# Patient Record
Sex: Female | Born: 1945
Health system: Southern US, Community
[De-identification: ages and names within clinical notes are randomized; demographics above are authoritative.]

## PROBLEM LIST (undated history)

## (undated) DIAGNOSIS — D473 Essential (hemorrhagic) thrombocythemia: Secondary | ICD-10-CM

## (undated) DIAGNOSIS — D75839 Thrombocytosis, unspecified: Secondary | ICD-10-CM

## (undated) DIAGNOSIS — H3530 Unspecified macular degeneration: Secondary | ICD-10-CM

## (undated) DIAGNOSIS — D751 Secondary polycythemia: Secondary | ICD-10-CM

## (undated) DIAGNOSIS — C801 Malignant (primary) neoplasm, unspecified: Secondary | ICD-10-CM

## (undated) DIAGNOSIS — E785 Hyperlipidemia, unspecified: Secondary | ICD-10-CM

## (undated) DIAGNOSIS — G709 Myoneural disorder, unspecified: Secondary | ICD-10-CM

## (undated) HISTORY — DX: Hyperlipidemia, unspecified: E78.5

## (undated) HISTORY — PX: TUMOR EXCISION: SHX421

## (undated) HISTORY — DX: Essential (hemorrhagic) thrombocythemia: D47.3

## (undated) HISTORY — DX: Myoneural disorder, unspecified: G70.9

## (undated) HISTORY — DX: Thrombocytosis, unspecified: D75.839

## (undated) HISTORY — DX: Secondary polycythemia: D75.1

## (undated) HISTORY — DX: Unspecified macular degeneration: H35.30

## (undated) HISTORY — PX: OTHER SURGICAL HISTORY: SHX169

## (undated) HISTORY — PX: CHOLECYSTECTOMY: SHX55

## (undated) HISTORY — DX: Malignant (primary) neoplasm, unspecified: C80.1

## (undated) HISTORY — PX: TONSILLECTOMY: SUR1361

## (undated) HISTORY — PX: POLYPECTOMY: SHX149

## (undated) HISTORY — PX: COLONOSCOPY: SHX174

## (undated) HISTORY — PX: ABDOMINAL HYSTERECTOMY: SHX81

---

## 2000-05-02 ENCOUNTER — Encounter: Payer: Self-pay | Admitting: Family Medicine

## 2000-05-02 ENCOUNTER — Encounter: Admission: RE | Admit: 2000-05-02 | Discharge: 2000-05-02 | Payer: Self-pay | Admitting: Family Medicine

## 2001-05-05 ENCOUNTER — Encounter: Payer: Self-pay | Admitting: Family Medicine

## 2001-05-05 ENCOUNTER — Encounter: Admission: RE | Admit: 2001-05-05 | Discharge: 2001-05-05 | Payer: Self-pay | Admitting: Family Medicine

## 2002-05-08 ENCOUNTER — Encounter: Admission: RE | Admit: 2002-05-08 | Discharge: 2002-05-08 | Payer: Self-pay | Admitting: Family Medicine

## 2002-05-08 ENCOUNTER — Encounter: Payer: Self-pay | Admitting: Family Medicine

## 2003-05-22 ENCOUNTER — Encounter: Admission: RE | Admit: 2003-05-22 | Discharge: 2003-05-22 | Payer: Self-pay | Admitting: Family Medicine

## 2003-05-22 ENCOUNTER — Encounter: Payer: Self-pay | Admitting: Family Medicine

## 2004-05-22 ENCOUNTER — Encounter: Admission: RE | Admit: 2004-05-22 | Discharge: 2004-05-22 | Payer: Self-pay | Admitting: Family Medicine

## 2005-01-13 ENCOUNTER — Ambulatory Visit: Payer: Self-pay | Admitting: Gastroenterology

## 2005-01-27 ENCOUNTER — Ambulatory Visit: Payer: Self-pay | Admitting: Gastroenterology

## 2005-06-16 ENCOUNTER — Encounter: Admission: RE | Admit: 2005-06-16 | Discharge: 2005-06-16 | Payer: Self-pay | Admitting: Family Medicine

## 2006-06-22 ENCOUNTER — Encounter: Admission: RE | Admit: 2006-06-22 | Discharge: 2006-06-22 | Payer: Self-pay | Admitting: Family Medicine

## 2006-06-29 ENCOUNTER — Encounter: Admission: RE | Admit: 2006-06-29 | Discharge: 2006-06-29 | Payer: Self-pay | Admitting: Family Medicine

## 2006-08-28 ENCOUNTER — Ambulatory Visit: Payer: Self-pay | Admitting: Oncology

## 2006-09-08 LAB — CBC WITH DIFFERENTIAL/PLATELET
Basophils Absolute: 0 10*3/uL (ref 0.0–0.1)
Eosinophils Absolute: 0.2 10*3/uL (ref 0.0–0.5)
HCT: 44.5 % (ref 34.8–46.6)
HGB: 15.1 g/dL (ref 11.6–15.9)
LYMPH%: 21.8 % (ref 14.0–48.0)
MCV: 93.2 fL (ref 81.0–101.0)
MONO#: 0.9 10*3/uL (ref 0.1–0.9)
MONO%: 8.4 % (ref 0.0–13.0)
NEUT#: 7.1 10*3/uL — ABNORMAL HIGH (ref 1.5–6.5)
NEUT%: 67.2 % (ref 39.6–76.8)
Platelets: 801 10*3/uL — ABNORMAL HIGH (ref 145–400)
RBC: 4.78 10*6/uL (ref 3.70–5.32)
WBC: 10.6 10*3/uL — ABNORMAL HIGH (ref 3.9–10.0)

## 2006-09-13 LAB — COMPREHENSIVE METABOLIC PANEL
ALT: 12 U/L (ref 0–35)
BUN: 12 mg/dL (ref 6–23)
CO2: 24 mEq/L (ref 19–32)
Calcium: 9.2 mg/dL (ref 8.4–10.5)
Chloride: 107 mEq/L (ref 96–112)
Creatinine, Ser: 0.77 mg/dL (ref 0.40–1.20)
Glucose, Bld: 88 mg/dL (ref 70–99)
Total Bilirubin: 0.4 mg/dL (ref 0.3–1.2)

## 2006-09-13 LAB — JAK2 GENOTYPR

## 2006-10-14 ENCOUNTER — Ambulatory Visit: Payer: Self-pay | Admitting: Oncology

## 2006-10-19 LAB — CBC WITH DIFFERENTIAL/PLATELET
BASO%: 1 % (ref 0.0–2.0)
Basophils Absolute: 0.1 10*3/uL (ref 0.0–0.1)
EOS%: 2.5 % (ref 0.0–7.0)
HCT: 46 % (ref 34.8–46.6)
HGB: 15.8 g/dL (ref 11.6–15.9)
MCH: 32.3 pg (ref 26.0–34.0)
MONO#: 0.7 10*3/uL (ref 0.1–0.9)
NEUT%: 74.7 % (ref 39.6–76.8)
RDW: 14.6 % — ABNORMAL HIGH (ref 11.3–14.5)
WBC: 12.7 10*3/uL — ABNORMAL HIGH (ref 3.9–10.0)
lymph#: 2.1 10*3/uL (ref 0.9–3.3)

## 2006-11-24 LAB — CBC WITH DIFFERENTIAL/PLATELET
BASO%: 0.4 % (ref 0.0–2.0)
Eosinophils Absolute: 0.5 10*3/uL (ref 0.0–0.5)
MCV: 91.1 fL (ref 81.0–101.0)
MONO%: 6.4 % (ref 0.0–13.0)
NEUT#: 7.8 10*3/uL — ABNORMAL HIGH (ref 1.5–6.5)
RBC: 4.71 10*6/uL (ref 3.70–5.32)
RDW: 13.7 % (ref 11.3–14.5)
WBC: 11.6 10*3/uL — ABNORMAL HIGH (ref 3.9–10.0)

## 2006-11-30 LAB — COMPREHENSIVE METABOLIC PANEL
Albumin: 4.2 g/dL (ref 3.5–5.2)
CO2: 20 mEq/L (ref 19–32)
Calcium: 9 mg/dL (ref 8.4–10.5)
Chloride: 105 mEq/L (ref 96–112)
Glucose, Bld: 95 mg/dL (ref 70–99)
Potassium: 4.4 mEq/L (ref 3.5–5.3)
Sodium: 137 mEq/L (ref 135–145)
Total Bilirubin: 0.4 mg/dL (ref 0.3–1.2)
Total Protein: 7.2 g/dL (ref 6.0–8.3)

## 2006-11-30 LAB — BCR/ABL GENE REARRANGEMENT QNT, PCR

## 2006-11-30 LAB — BCR/ABL QUALITATIVE: BCR/ABL t(9,22): NEGATIVE

## 2006-12-06 ENCOUNTER — Ambulatory Visit: Payer: Self-pay | Admitting: Oncology

## 2006-12-08 LAB — CBC WITH DIFFERENTIAL/PLATELET
BASO%: 1.5 % (ref 0.0–2.0)
Eosinophils Absolute: 0.4 10*3/uL (ref 0.0–0.5)
MCHC: 34.6 g/dL (ref 32.0–36.0)
MONO#: 0.7 10*3/uL (ref 0.1–0.9)
NEUT#: 6.8 10*3/uL — ABNORMAL HIGH (ref 1.5–6.5)
RBC: 4.55 10*6/uL (ref 3.70–5.32)
RDW: 13.7 % (ref 11.3–14.5)
WBC: 10.2 10*3/uL — ABNORMAL HIGH (ref 3.9–10.0)
lymph#: 2.2 10*3/uL (ref 0.9–3.3)

## 2006-12-29 LAB — CBC WITH DIFFERENTIAL/PLATELET
BASO%: 1.4 % (ref 0.0–2.0)
EOS%: 2.3 % (ref 0.0–7.0)
HCT: 44.5 % (ref 34.8–46.6)
LYMPH%: 26.2 % (ref 14.0–48.0)
MCH: 32 pg (ref 26.0–34.0)
MCHC: 35.2 g/dL (ref 32.0–36.0)
NEUT%: 65.1 % (ref 39.6–76.8)
Platelets: 436 10*3/uL — ABNORMAL HIGH (ref 145–400)
RBC: 4.9 10*6/uL (ref 3.70–5.32)
WBC: 11.4 10*3/uL — ABNORMAL HIGH (ref 3.9–10.0)

## 2007-01-12 LAB — CBC WITH DIFFERENTIAL/PLATELET
BASO%: 1.3 % (ref 0.0–2.0)
EOS%: 1.3 % (ref 0.0–7.0)
MCH: 31.4 pg (ref 26.0–34.0)
MCHC: 34.7 g/dL (ref 32.0–36.0)
MONO#: 0.7 10*3/uL (ref 0.1–0.9)
RBC: 5.05 10*6/uL (ref 3.70–5.32)
RDW: 14.7 % — ABNORMAL HIGH (ref 11.3–14.5)
WBC: 9.8 10*3/uL (ref 3.9–10.0)
lymph#: 2.3 10*3/uL (ref 0.9–3.3)

## 2007-01-23 ENCOUNTER — Ambulatory Visit: Payer: Self-pay | Admitting: Oncology

## 2007-01-26 LAB — CBC WITH DIFFERENTIAL/PLATELET
Basophils Absolute: 0.1 10*3/uL (ref 0.0–0.1)
EOS%: 2.1 % (ref 0.0–7.0)
Eosinophils Absolute: 0.2 10*3/uL (ref 0.0–0.5)
HGB: 15.6 g/dL (ref 11.6–15.9)
MCH: 31.9 pg (ref 26.0–34.0)
MONO#: 0.6 10*3/uL (ref 0.1–0.9)
NEUT#: 8.2 10*3/uL — ABNORMAL HIGH (ref 1.5–6.5)
RDW: 14.4 % (ref 11.3–14.5)
WBC: 12.1 10*3/uL — ABNORMAL HIGH (ref 3.9–10.0)
lymph#: 3 10*3/uL (ref 0.9–3.3)

## 2007-02-09 LAB — CBC WITH DIFFERENTIAL/PLATELET
Basophils Absolute: 0.1 10*3/uL (ref 0.0–0.1)
Eosinophils Absolute: 0.2 10*3/uL (ref 0.0–0.5)
HCT: 44.9 % (ref 34.8–46.6)
HGB: 15.7 g/dL (ref 11.6–15.9)
MCV: 89.9 fL (ref 81.0–101.0)
MONO%: 5.6 % (ref 0.0–13.0)
NEUT#: 7.9 10*3/uL — ABNORMAL HIGH (ref 1.5–6.5)
RDW: 14.9 % — ABNORMAL HIGH (ref 11.3–14.5)

## 2007-03-10 ENCOUNTER — Ambulatory Visit: Payer: Self-pay | Admitting: Oncology

## 2007-03-10 LAB — CBC WITH DIFFERENTIAL/PLATELET
Basophils Absolute: 0.3 10*3/uL — ABNORMAL HIGH (ref 0.0–0.1)
Eosinophils Absolute: 0.2 10*3/uL (ref 0.0–0.5)
HGB: 15.2 g/dL (ref 11.6–15.9)
LYMPH%: 21.8 % (ref 14.0–48.0)
MCV: 90.1 fL (ref 81.0–101.0)
MONO%: 3.5 % (ref 0.0–13.0)
NEUT#: 7.8 10*3/uL — ABNORMAL HIGH (ref 1.5–6.5)
Platelets: 534 10*3/uL — ABNORMAL HIGH (ref 145–400)
RBC: 4.82 10*6/uL (ref 3.70–5.32)

## 2007-04-13 LAB — COMPREHENSIVE METABOLIC PANEL
ALT: 15 U/L (ref 0–35)
AST: 21 U/L (ref 0–37)
Albumin: 4.4 g/dL (ref 3.5–5.2)
Alkaline Phosphatase: 60 U/L (ref 39–117)
BUN: 12 mg/dL (ref 6–23)
Potassium: 4.6 mEq/L (ref 3.5–5.3)
Sodium: 141 mEq/L (ref 135–145)
Total Protein: 6.8 g/dL (ref 6.0–8.3)

## 2007-04-13 LAB — CBC WITH DIFFERENTIAL/PLATELET
BASO%: 0.3 % (ref 0.0–2.0)
LYMPH%: 16 % (ref 14.0–48.0)
MCHC: 34.9 g/dL (ref 32.0–36.0)
MCV: 90.9 fL (ref 81.0–101.0)
MONO%: 5.8 % (ref 0.0–13.0)
Platelets: 550 10*3/uL — ABNORMAL HIGH (ref 145–400)
RBC: 4.99 10*6/uL (ref 3.70–5.32)

## 2007-05-01 ENCOUNTER — Ambulatory Visit (HOSPITAL_COMMUNITY): Admission: RE | Admit: 2007-05-01 | Discharge: 2007-05-01 | Payer: Self-pay | Admitting: Oncology

## 2007-05-01 ENCOUNTER — Encounter: Payer: Self-pay | Admitting: Oncology

## 2007-05-01 ENCOUNTER — Ambulatory Visit: Payer: Self-pay | Admitting: Oncology

## 2007-05-02 ENCOUNTER — Ambulatory Visit: Payer: Self-pay | Admitting: Oncology

## 2007-05-05 LAB — CBC WITH DIFFERENTIAL/PLATELET
Basophils Absolute: 0 10*3/uL (ref 0.0–0.1)
EOS%: 1.7 % (ref 0.0–7.0)
HGB: 15.5 g/dL (ref 11.6–15.9)
MCH: 32.3 pg (ref 26.0–34.0)
MCHC: 35.3 g/dL (ref 32.0–36.0)
MCV: 91.5 fL (ref 81.0–101.0)
MONO%: 5.8 % (ref 0.0–13.0)
RDW: 14.6 % — ABNORMAL HIGH (ref 11.3–14.5)

## 2007-05-05 LAB — COMPREHENSIVE METABOLIC PANEL
AST: 18 U/L (ref 0–37)
Alkaline Phosphatase: 65 U/L (ref 39–117)
BUN: 14 mg/dL (ref 6–23)
Creatinine, Ser: 0.97 mg/dL (ref 0.40–1.20)
Potassium: 5 mEq/L (ref 3.5–5.3)

## 2007-06-26 ENCOUNTER — Ambulatory Visit: Payer: Self-pay | Admitting: Oncology

## 2007-06-28 ENCOUNTER — Encounter: Admission: RE | Admit: 2007-06-28 | Discharge: 2007-06-28 | Payer: Self-pay | Admitting: *Deleted

## 2007-06-28 LAB — CBC WITH DIFFERENTIAL/PLATELET
BASO%: 0.6 % (ref 0.0–2.0)
Basophils Absolute: 0.1 10*3/uL (ref 0.0–0.1)
HCT: 41.9 % (ref 34.8–46.6)
HGB: 14.9 g/dL (ref 11.6–15.9)
LYMPH%: 21.4 % (ref 14.0–48.0)
MCH: 32.8 pg (ref 26.0–34.0)
MCV: 92.1 fL (ref 81.0–101.0)

## 2007-08-02 LAB — CBC WITH DIFFERENTIAL/PLATELET
BASO%: 0.4 % (ref 0.0–2.0)
Eosinophils Absolute: 0.2 10*3/uL (ref 0.0–0.5)
HCT: 44.3 % (ref 34.8–46.6)
HGB: 15.5 g/dL (ref 11.6–15.9)
LYMPH%: 24.8 % (ref 14.0–48.0)
MCHC: 35 g/dL (ref 32.0–36.0)
MONO#: 0.6 10*3/uL (ref 0.1–0.9)
NEUT#: 6.5 10*3/uL (ref 1.5–6.5)
NEUT%: 66.2 % (ref 39.6–76.8)
Platelets: 753 10*3/uL — ABNORMAL HIGH (ref 145–400)
WBC: 9.8 10*3/uL (ref 3.9–10.0)
lymph#: 2.4 10*3/uL (ref 0.9–3.3)

## 2007-10-02 ENCOUNTER — Ambulatory Visit: Payer: Self-pay | Admitting: Oncology

## 2007-10-04 LAB — COMPREHENSIVE METABOLIC PANEL
ALT: 19 U/L (ref 0–35)
AST: 24 U/L (ref 0–37)
Albumin: 4.6 g/dL (ref 3.5–5.2)
Alkaline Phosphatase: 81 U/L (ref 39–117)
Calcium: 9.8 mg/dL (ref 8.4–10.5)
Chloride: 102 mEq/L (ref 96–112)
Potassium: 4.9 mEq/L (ref 3.5–5.3)
Sodium: 141 mEq/L (ref 135–145)
Total Protein: 7.2 g/dL (ref 6.0–8.3)

## 2007-10-04 LAB — CBC WITH DIFFERENTIAL/PLATELET
BASO%: 0.1 % (ref 0.0–2.0)
EOS%: 3.3 % (ref 0.0–7.0)
HGB: 16.6 g/dL — ABNORMAL HIGH (ref 11.6–15.9)
MCH: 32 pg (ref 26.0–34.0)
MCV: 91.7 fL (ref 81.0–101.0)
MONO%: 4.7 % (ref 0.0–13.0)
RBC: 5.19 10*6/uL (ref 3.70–5.32)
RDW: 14 % (ref 11.3–14.5)
lymph#: 2.3 10*3/uL (ref 0.9–3.3)

## 2007-11-07 LAB — CBC WITH DIFFERENTIAL/PLATELET
Basophils Absolute: 0.1 10*3/uL (ref 0.0–0.1)
Eosinophils Absolute: 0.5 10*3/uL (ref 0.0–0.5)
HGB: 15.6 g/dL (ref 11.6–15.9)
MCV: 90.6 fL (ref 81.0–101.0)
MONO#: 0.5 10*3/uL (ref 0.1–0.9)
MONO%: 4.4 % (ref 0.0–13.0)
NEUT#: 8.9 10*3/uL — ABNORMAL HIGH (ref 1.5–6.5)
RDW: 14.1 % (ref 11.3–14.5)
WBC: 12.4 10*3/uL — ABNORMAL HIGH (ref 3.9–10.0)

## 2007-12-01 ENCOUNTER — Ambulatory Visit: Payer: Self-pay | Admitting: Oncology

## 2007-12-05 LAB — CBC WITH DIFFERENTIAL/PLATELET
BASO%: 0.7 % (ref 0.0–2.0)
HCT: 45.7 % (ref 34.8–46.6)
HGB: 16 g/dL — ABNORMAL HIGH (ref 11.6–15.9)
MCHC: 34.9 g/dL (ref 32.0–36.0)
MONO#: 0.9 10*3/uL (ref 0.1–0.9)
NEUT%: 69.9 % (ref 39.6–76.8)
WBC: 12 10*3/uL — ABNORMAL HIGH (ref 3.9–10.0)
lymph#: 2.2 10*3/uL (ref 0.9–3.3)

## 2008-01-09 LAB — CBC WITH DIFFERENTIAL/PLATELET
Basophils Absolute: 0.1 10*3/uL (ref 0.0–0.1)
Eosinophils Absolute: 0.3 10*3/uL (ref 0.0–0.5)
HGB: 16.1 g/dL — ABNORMAL HIGH (ref 11.6–15.9)
LYMPH%: 27.2 % (ref 14.0–48.0)
MCV: 90.9 fL (ref 81.0–101.0)
MONO%: 1.6 % (ref 0.0–13.0)
NEUT#: 9.5 10*3/uL — ABNORMAL HIGH (ref 1.5–6.5)
NEUT%: 68.6 % (ref 39.6–76.8)
Platelets: INCREASED 10*3/uL (ref 145–400)

## 2008-01-09 LAB — LACTATE DEHYDROGENASE: LDH: 164 U/L (ref 94–250)

## 2008-01-09 LAB — COMPREHENSIVE METABOLIC PANEL
CO2: 24 mEq/L (ref 19–32)
Creatinine, Ser: 1.04 mg/dL (ref 0.40–1.20)
Glucose, Bld: 86 mg/dL (ref 70–99)
Total Bilirubin: 0.4 mg/dL (ref 0.3–1.2)
Total Protein: 6.8 g/dL (ref 6.0–8.3)

## 2008-02-06 ENCOUNTER — Ambulatory Visit: Payer: Self-pay | Admitting: Oncology

## 2008-02-22 LAB — CBC WITH DIFFERENTIAL/PLATELET
BASO%: 0.6 % (ref 0.0–2.0)
Eosinophils Absolute: 0.4 10*3/uL (ref 0.0–0.5)
LYMPH%: 18.8 % (ref 14.0–48.0)
MCHC: 34.2 g/dL (ref 32.0–36.0)
MCV: 91.8 fL (ref 81.0–101.0)
MONO%: 6.9 % (ref 0.0–13.0)
Platelets: 1041 10*3/uL — ABNORMAL HIGH (ref 145–400)
RBC: 4.81 10*6/uL (ref 3.70–5.32)

## 2008-03-07 LAB — COMPREHENSIVE METABOLIC PANEL
ALT: 17 U/L (ref 0–35)
Alkaline Phosphatase: 63 U/L (ref 39–117)
Sodium: 140 mEq/L (ref 135–145)
Total Bilirubin: 0.6 mg/dL (ref 0.3–1.2)
Total Protein: 6.9 g/dL (ref 6.0–8.3)

## 2008-03-07 LAB — CBC WITH DIFFERENTIAL/PLATELET
BASO%: 1.7 % (ref 0.0–2.0)
LYMPH%: 21.9 % (ref 14.0–48.0)
MCHC: 34 g/dL (ref 32.0–36.0)
MCV: 94.6 fL (ref 81.0–101.0)
MONO#: 0.7 10*3/uL (ref 0.1–0.9)
MONO%: 6.6 % (ref 0.0–13.0)
Platelets: 814 10*3/uL — ABNORMAL HIGH (ref 145–400)
RBC: 5 10*6/uL (ref 3.70–5.32)
WBC: 10.1 10*3/uL — ABNORMAL HIGH (ref 3.9–10.0)

## 2008-04-01 ENCOUNTER — Ambulatory Visit: Payer: Self-pay | Admitting: Oncology

## 2008-04-03 LAB — CBC WITH DIFFERENTIAL/PLATELET
EOS%: 2.3 % (ref 0.0–7.0)
Eosinophils Absolute: 0.2 10*3/uL (ref 0.0–0.5)
LYMPH%: 24.8 % (ref 14.0–48.0)
MCH: 33.2 pg (ref 26.0–34.0)
MCV: 96 fL (ref 81.0–101.0)
MONO%: 8.4 % (ref 0.0–13.0)
NEUT#: 6.7 10*3/uL — ABNORMAL HIGH (ref 1.5–6.5)
Platelets: 830 10*3/uL — ABNORMAL HIGH (ref 145–400)
RBC: 4.77 10*6/uL (ref 3.70–5.32)

## 2008-05-02 LAB — CBC WITH DIFFERENTIAL/PLATELET
Basophils Absolute: 0 10*3/uL (ref 0.0–0.1)
Eosinophils Absolute: 0.2 10*3/uL (ref 0.0–0.5)
HCT: 43.9 % (ref 34.8–46.6)
HGB: 15.2 g/dL (ref 11.6–15.9)
LYMPH%: 26.3 % (ref 14.0–48.0)
MCH: 34.6 pg — ABNORMAL HIGH (ref 26.0–34.0)
MCV: 99.7 fL (ref 81.0–101.0)
MONO%: 7.9 % (ref 0.0–13.0)
NEUT#: 5 10*3/uL (ref 1.5–6.5)
NEUT%: 62.4 % (ref 39.6–76.8)
Platelets: 579 10*3/uL — ABNORMAL HIGH (ref 145–400)
RDW: 17.3 % — ABNORMAL HIGH (ref 11.3–14.5)

## 2008-05-31 ENCOUNTER — Ambulatory Visit: Payer: Self-pay | Admitting: Oncology

## 2008-06-05 LAB — CBC WITH DIFFERENTIAL/PLATELET
Basophils Absolute: 0 10*3/uL (ref 0.0–0.1)
Eosinophils Absolute: 0.2 10*3/uL (ref 0.0–0.5)
HCT: 46.4 % (ref 34.8–46.6)
HGB: 16.1 g/dL — ABNORMAL HIGH (ref 11.6–15.9)
LYMPH%: 25.1 % (ref 14.0–48.0)
MONO#: 0.5 10*3/uL (ref 0.1–0.9)
NEUT#: 5.7 10*3/uL (ref 1.5–6.5)
NEUT%: 66.8 % (ref 39.6–76.8)
Platelets: 579 10*3/uL — ABNORMAL HIGH (ref 145–400)
RBC: 4.51 10*6/uL (ref 3.70–5.32)
WBC: 8.5 10*3/uL (ref 3.9–10.0)

## 2008-06-05 LAB — COMPREHENSIVE METABOLIC PANEL
AST: 25 U/L (ref 0–37)
Alkaline Phosphatase: 62 U/L (ref 39–117)
BUN: 15 mg/dL (ref 6–23)
Glucose, Bld: 86 mg/dL (ref 70–99)
Total Bilirubin: 0.5 mg/dL (ref 0.3–1.2)

## 2008-06-05 LAB — TECHNOLOGIST REVIEW

## 2008-07-01 ENCOUNTER — Encounter: Admission: RE | Admit: 2008-07-01 | Discharge: 2008-07-01 | Payer: Self-pay | Admitting: Family Medicine

## 2008-07-17 ENCOUNTER — Ambulatory Visit: Payer: Self-pay | Admitting: Oncology

## 2008-07-17 LAB — COMPREHENSIVE METABOLIC PANEL
BUN: 17 mg/dL (ref 6–23)
CO2: 26 mEq/L (ref 19–32)
Creatinine, Ser: 0.88 mg/dL (ref 0.40–1.20)
Glucose, Bld: 91 mg/dL (ref 70–99)
Total Bilirubin: 0.4 mg/dL (ref 0.3–1.2)

## 2008-07-17 LAB — CBC WITH DIFFERENTIAL/PLATELET
Eosinophils Absolute: 0.1 10*3/uL (ref 0.0–0.5)
HCT: 43.2 % (ref 34.8–46.6)
LYMPH%: 26.2 % (ref 14.0–48.0)
MCHC: 34.7 g/dL (ref 32.0–36.0)
MCV: 105.2 fL — ABNORMAL HIGH (ref 81.0–101.0)
MONO#: 0.5 10*3/uL (ref 0.1–0.9)
MONO%: 5.7 % (ref 0.0–13.0)
NEUT#: 5.3 10*3/uL (ref 1.5–6.5)
NEUT%: 66.1 % (ref 39.6–76.8)
Platelets: 546 10*3/uL — ABNORMAL HIGH (ref 145–400)
WBC: 8 10*3/uL (ref 3.9–10.0)

## 2008-09-06 ENCOUNTER — Ambulatory Visit: Payer: Self-pay | Admitting: Oncology

## 2008-09-10 LAB — COMPREHENSIVE METABOLIC PANEL
ALT: 16 U/L (ref 0–35)
Alkaline Phosphatase: 65 U/L (ref 39–117)
CO2: 25 mEq/L (ref 19–32)
Creatinine, Ser: 0.98 mg/dL (ref 0.40–1.20)
Sodium: 139 mEq/L (ref 135–145)
Total Bilirubin: 0.4 mg/dL (ref 0.3–1.2)

## 2008-09-10 LAB — CBC WITH DIFFERENTIAL/PLATELET
BASO%: 0.4 % (ref 0.0–2.0)
Basophils Absolute: 0 10*3/uL (ref 0.0–0.1)
Eosinophils Absolute: 0.1 10*3/uL (ref 0.0–0.5)
HCT: 41.3 % (ref 34.8–46.6)
HGB: 14.1 g/dL (ref 11.6–15.9)
MCHC: 34.1 g/dL (ref 32.0–36.0)
MONO#: 0.4 10*3/uL (ref 0.1–0.9)
NEUT#: 5.4 10*3/uL (ref 1.5–6.5)
NEUT%: 70.7 % (ref 39.6–76.8)
WBC: 7.6 10*3/uL (ref 3.9–10.0)
lymph#: 1.6 10*3/uL (ref 0.9–3.3)

## 2008-11-08 ENCOUNTER — Ambulatory Visit: Payer: Self-pay | Admitting: Oncology

## 2008-11-12 LAB — COMPREHENSIVE METABOLIC PANEL
BUN: 15 mg/dL (ref 6–23)
CO2: 27 mEq/L (ref 19–32)
Calcium: 9.6 mg/dL (ref 8.4–10.5)
Chloride: 103 mEq/L (ref 96–112)
Creatinine, Ser: 0.96 mg/dL (ref 0.40–1.20)
Glucose, Bld: 91 mg/dL (ref 70–99)
Total Bilirubin: 0.4 mg/dL (ref 0.3–1.2)

## 2008-11-12 LAB — CBC WITH DIFFERENTIAL/PLATELET
Eosinophils Absolute: 0.1 10*3/uL (ref 0.0–0.5)
HCT: 39.7 % (ref 34.8–46.6)
LYMPH%: 25.3 % (ref 14.0–48.0)
MCHC: 34.2 g/dL (ref 32.0–36.0)
MCV: 107.4 fL — ABNORMAL HIGH (ref 81.0–101.0)
MONO#: 0.5 10*3/uL (ref 0.1–0.9)
MONO%: 7.5 % (ref 0.0–13.0)
NEUT#: 4.6 10*3/uL (ref 1.5–6.5)
NEUT%: 65.9 % (ref 39.6–76.8)
Platelets: 503 10*3/uL — ABNORMAL HIGH (ref 145–400)
WBC: 6.9 10*3/uL (ref 3.9–10.0)

## 2009-01-06 ENCOUNTER — Ambulatory Visit: Payer: Self-pay | Admitting: Oncology

## 2009-01-08 LAB — CBC WITH DIFFERENTIAL/PLATELET
Basophils Absolute: 0 10*3/uL (ref 0.0–0.1)
EOS%: 2 % (ref 0.0–7.0)
Eosinophils Absolute: 0.1 10*3/uL (ref 0.0–0.5)
HCT: 45 % (ref 34.8–46.6)
HGB: 15.9 g/dL (ref 11.6–15.9)
LYMPH%: 30.1 % (ref 14.0–49.7)
MCH: 36.4 pg — ABNORMAL HIGH (ref 25.1–34.0)
MCV: 103 fL — ABNORMAL HIGH (ref 79.5–101.0)
MONO%: 6.2 % (ref 0.0–14.0)
NEUT#: 3.7 10*3/uL (ref 1.5–6.5)
NEUT%: 61.2 % (ref 38.4–76.8)
Platelets: 349 10*3/uL (ref 145–400)
RDW: 12.9 % (ref 11.2–14.5)

## 2009-01-08 LAB — COMPREHENSIVE METABOLIC PANEL
AST: 20 U/L (ref 0–37)
Albumin: 4.4 g/dL (ref 3.5–5.2)
Alkaline Phosphatase: 74 U/L (ref 39–117)
BUN: 13 mg/dL (ref 6–23)
Creatinine, Ser: 0.98 mg/dL (ref 0.40–1.20)
Glucose, Bld: 90 mg/dL (ref 70–99)
Total Bilirubin: 0.5 mg/dL (ref 0.3–1.2)

## 2009-03-07 ENCOUNTER — Ambulatory Visit: Payer: Self-pay | Admitting: Oncology

## 2009-03-11 LAB — COMPREHENSIVE METABOLIC PANEL
AST: 18 U/L (ref 0–37)
Albumin: 4.3 g/dL (ref 3.5–5.2)
Alkaline Phosphatase: 57 U/L (ref 39–117)
BUN: 16 mg/dL (ref 6–23)
Potassium: 4.2 mEq/L (ref 3.5–5.3)
Total Bilirubin: 0.4 mg/dL (ref 0.3–1.2)

## 2009-03-11 LAB — CBC WITH DIFFERENTIAL/PLATELET
BASO%: 0.5 % (ref 0.0–2.0)
EOS%: 1.6 % (ref 0.0–7.0)
MCH: 37.8 pg — ABNORMAL HIGH (ref 25.1–34.0)
MCHC: 35.3 g/dL (ref 31.5–36.0)
MCV: 107.2 fL — ABNORMAL HIGH (ref 79.5–101.0)
MONO%: 6.8 % (ref 0.0–14.0)
NEUT%: 67 % (ref 38.4–76.8)
RDW: 13.6 % (ref 11.2–14.5)
lymph#: 1.7 10*3/uL (ref 0.9–3.3)

## 2009-05-13 ENCOUNTER — Ambulatory Visit: Payer: Self-pay | Admitting: Oncology

## 2009-05-15 LAB — COMPREHENSIVE METABOLIC PANEL
ALT: 14 U/L (ref 0–35)
Alkaline Phosphatase: 63 U/L (ref 39–117)
CO2: 23 mEq/L (ref 19–32)
Creatinine, Ser: 1.06 mg/dL (ref 0.40–1.20)
Sodium: 139 mEq/L (ref 135–145)
Total Bilirubin: 0.5 mg/dL (ref 0.3–1.2)
Total Protein: 6.6 g/dL (ref 6.0–8.3)

## 2009-05-15 LAB — CBC WITH DIFFERENTIAL/PLATELET
BASO%: 1.1 % (ref 0.0–2.0)
Basophils Absolute: 0.1 10*3/uL (ref 0.0–0.1)
EOS%: 1.5 % (ref 0.0–7.0)
HGB: 15.1 g/dL (ref 11.6–15.9)
MCH: 36.8 pg — ABNORMAL HIGH (ref 25.1–34.0)
MCHC: 34.7 g/dL (ref 31.5–36.0)
MCV: 106.1 fL — ABNORMAL HIGH (ref 79.5–101.0)
MONO%: 9.1 % (ref 0.0–14.0)
RBC: 4.1 10*6/uL (ref 3.70–5.45)
RDW: 12.7 % (ref 11.2–14.5)
lymph#: 1.7 10*3/uL (ref 0.9–3.3)
nRBC: 0 % (ref 0–0)

## 2009-05-15 LAB — TECHNOLOGIST REVIEW

## 2009-07-02 ENCOUNTER — Encounter: Admission: RE | Admit: 2009-07-02 | Discharge: 2009-07-02 | Payer: Self-pay | Admitting: Family Medicine

## 2009-07-11 ENCOUNTER — Ambulatory Visit: Payer: Self-pay | Admitting: Oncology

## 2009-07-15 LAB — CBC WITH DIFFERENTIAL/PLATELET
EOS%: 1.2 % (ref 0.0–7.0)
Eosinophils Absolute: 0.1 10*3/uL (ref 0.0–0.5)
LYMPH%: 22.1 % (ref 14.0–49.7)
MCHC: 34.3 g/dL (ref 31.5–36.0)
MCV: 107.4 fL — ABNORMAL HIGH (ref 79.5–101.0)
MONO#: 0.6 10*3/uL (ref 0.1–0.9)
MONO%: 7.8 % (ref 0.0–14.0)
RBC: 4.32 10*6/uL (ref 3.70–5.45)
WBC: 7.6 10*3/uL (ref 3.9–10.3)
lymph#: 1.7 10*3/uL (ref 0.9–3.3)

## 2009-07-15 LAB — COMPREHENSIVE METABOLIC PANEL
AST: 19 U/L (ref 0–37)
Albumin: 4.5 g/dL (ref 3.5–5.2)
Alkaline Phosphatase: 74 U/L (ref 39–117)
BUN: 14 mg/dL (ref 6–23)
Glucose, Bld: 87 mg/dL (ref 70–99)
Potassium: 4.6 mEq/L (ref 3.5–5.3)
Total Bilirubin: 0.5 mg/dL (ref 0.3–1.2)

## 2009-07-15 LAB — LIPID PANEL
Cholesterol: 205 mg/dL — ABNORMAL HIGH (ref 0–200)
HDL: 80 mg/dL (ref >39)
LDL Cholesterol: 95 mg/dL (ref 0–99)
Total CHOL/HDL Ratio: 2.6 Ratio
Triglycerides: 150 mg/dL — ABNORMAL HIGH (ref <150)
VLDL: 30 mg/dL (ref 0–40)

## 2009-09-17 ENCOUNTER — Ambulatory Visit: Payer: Self-pay | Admitting: Oncology

## 2009-09-17 LAB — COMPREHENSIVE METABOLIC PANEL
AST: 24 U/L (ref 0–37)
Alkaline Phosphatase: 67 U/L (ref 39–117)
BUN: 12 mg/dL (ref 6–23)
Calcium: 9.2 mg/dL (ref 8.4–10.5)
Glucose, Bld: 88 mg/dL (ref 70–99)
Potassium: 4.2 mEq/L (ref 3.5–5.3)

## 2009-09-17 LAB — CBC WITH DIFFERENTIAL/PLATELET
Basophils Absolute: 0.1 10*3/uL (ref 0.0–0.1)
EOS%: 1.5 % (ref 0.0–7.0)
HGB: 14.9 g/dL (ref 11.6–15.9)
LYMPH%: 22.6 % (ref 14.0–49.7)
MCH: 37.1 pg — ABNORMAL HIGH (ref 25.1–34.0)
MCV: 109.5 fL — ABNORMAL HIGH (ref 79.5–101.0)
MONO#: 0.4 10*3/uL (ref 0.1–0.9)
MONO%: 5.9 % (ref 0.0–14.0)
NEUT#: 5.3 10*3/uL (ref 1.5–6.5)
NEUT%: 69.1 % (ref 38.4–76.8)
Platelets: 575 10*3/uL — ABNORMAL HIGH (ref 145–400)
RDW: 14 % (ref 11.2–14.5)
WBC: 7.6 10*3/uL (ref 3.9–10.3)
lymph#: 1.7 10*3/uL (ref 0.9–3.3)

## 2009-11-17 ENCOUNTER — Ambulatory Visit: Payer: Self-pay | Admitting: Oncology

## 2009-11-19 LAB — COMPREHENSIVE METABOLIC PANEL
AST: 25 U/L (ref 0–37)
CO2: 27 mEq/L (ref 19–32)
Calcium: 10.1 mg/dL (ref 8.4–10.5)
Chloride: 101 mEq/L (ref 96–112)
Creatinine, Ser: 1.05 mg/dL (ref 0.40–1.20)
Sodium: 140 mEq/L (ref 135–145)

## 2009-11-19 LAB — CBC WITH DIFFERENTIAL/PLATELET
BASO%: 0.2 % (ref 0.0–2.0)
Eosinophils Absolute: 0.1 10*3/uL (ref 0.0–0.5)
HCT: 45.9 % (ref 34.8–46.6)
LYMPH%: 22.6 % (ref 14.0–49.7)
MONO#: 0.4 10*3/uL (ref 0.1–0.9)
MONO%: 5.7 % (ref 0.0–14.0)
NEUT#: 5.2 10*3/uL (ref 1.5–6.5)
NEUT%: 69.7 % (ref 38.4–76.8)
WBC: 7.5 10*3/uL (ref 3.9–10.3)
lymph#: 1.7 10*3/uL (ref 0.9–3.3)

## 2010-01-06 ENCOUNTER — Ambulatory Visit: Payer: Self-pay | Admitting: Oncology

## 2010-01-08 LAB — CBC WITH DIFFERENTIAL/PLATELET
EOS%: 1.7 % (ref 0.0–7.0)
HCT: 46.1 % (ref 34.8–46.6)
MCH: 36.7 pg — ABNORMAL HIGH (ref 25.1–34.0)
MCHC: 34.5 g/dL (ref 31.5–36.0)
MCV: 106.5 fL — ABNORMAL HIGH (ref 79.5–101.0)
MONO%: 8.9 % (ref 0.0–14.0)
Platelets: 481 10*3/uL — ABNORMAL HIGH (ref 145–400)
RBC: 4.33 10*6/uL (ref 3.70–5.45)
WBC: 7.1 10*3/uL (ref 3.9–10.3)
lymph#: 1.9 10*3/uL (ref 0.9–3.3)

## 2010-01-08 LAB — COMPREHENSIVE METABOLIC PANEL
AST: 21 U/L (ref 0–37)
Albumin: 4.4 g/dL (ref 3.5–5.2)
Alkaline Phosphatase: 62 U/L (ref 39–117)
BUN: 14 mg/dL (ref 6–23)
CO2: 24 mEq/L (ref 19–32)
Chloride: 103 mEq/L (ref 96–112)
Creatinine, Ser: 1 mg/dL (ref 0.40–1.20)
Glucose, Bld: 84 mg/dL (ref 70–99)
Total Bilirubin: 0.6 mg/dL (ref 0.3–1.2)

## 2010-01-20 ENCOUNTER — Encounter (INDEPENDENT_AMBULATORY_CARE_PROVIDER_SITE_OTHER): Payer: Self-pay | Admitting: *Deleted

## 2010-02-20 ENCOUNTER — Encounter (INDEPENDENT_AMBULATORY_CARE_PROVIDER_SITE_OTHER): Payer: Self-pay | Admitting: *Deleted

## 2010-02-26 ENCOUNTER — Ambulatory Visit: Payer: Self-pay | Admitting: Gastroenterology

## 2010-03-06 ENCOUNTER — Ambulatory Visit: Payer: Self-pay | Admitting: Oncology

## 2010-03-10 LAB — TECHNOLOGIST REVIEW

## 2010-03-10 LAB — COMPREHENSIVE METABOLIC PANEL
ALT: 15 U/L (ref 0–35)
AST: 23 U/L (ref 0–37)
CO2: 25 mEq/L (ref 19–32)
Chloride: 102 mEq/L (ref 96–112)
Creatinine, Ser: 0.95 mg/dL (ref 0.40–1.20)
Glucose, Bld: 87 mg/dL (ref 70–99)
Sodium: 139 mEq/L (ref 135–145)
Total Bilirubin: 0.5 mg/dL (ref 0.3–1.2)

## 2010-03-10 LAB — CBC WITH DIFFERENTIAL/PLATELET
BASO%: 0.5 % (ref 0.0–2.0)
HCT: 45.3 % (ref 34.8–46.6)
HGB: 15.8 g/dL (ref 11.6–15.9)
MCV: 107.2 fL — ABNORMAL HIGH (ref 79.5–101.0)
NEUT%: 69 % (ref 38.4–76.8)
RDW: 13.6 % (ref 11.2–14.5)

## 2010-03-12 ENCOUNTER — Ambulatory Visit: Payer: Self-pay | Admitting: Gastroenterology

## 2010-03-19 ENCOUNTER — Encounter: Payer: Self-pay | Admitting: Gastroenterology

## 2010-05-13 ENCOUNTER — Ambulatory Visit: Payer: Self-pay | Admitting: Oncology

## 2010-05-15 LAB — CBC WITH DIFFERENTIAL/PLATELET
BASO%: 0.5 % (ref 0.0–2.0)
EOS%: 0.9 % (ref 0.0–7.0)
HCT: 45 % (ref 34.8–46.6)
HGB: 15.6 g/dL (ref 11.6–15.9)
LYMPH%: 17.7 % (ref 14.0–49.7)
MCHC: 34.6 g/dL (ref 31.5–36.0)
NEUT%: 74.6 % (ref 38.4–76.8)
Platelets: 676 10*3/uL — ABNORMAL HIGH (ref 145–400)
RBC: 4.22 10*6/uL (ref 3.70–5.45)
WBC: 9.1 10*3/uL (ref 3.9–10.3)
lymph#: 1.6 10*3/uL (ref 0.9–3.3)

## 2010-05-15 LAB — COMPREHENSIVE METABOLIC PANEL
Albumin: 4.5 g/dL (ref 3.5–5.2)
Alkaline Phosphatase: 56 U/L (ref 39–117)
CO2: 21 mEq/L (ref 19–32)
Potassium: 4.5 mEq/L (ref 3.5–5.3)
Sodium: 143 mEq/L (ref 135–145)
Total Bilirubin: 0.6 mg/dL (ref 0.3–1.2)

## 2010-07-06 ENCOUNTER — Encounter: Admission: RE | Admit: 2010-07-06 | Discharge: 2010-07-06 | Payer: Self-pay | Admitting: Family Medicine

## 2010-07-10 ENCOUNTER — Ambulatory Visit: Payer: Self-pay | Admitting: Oncology

## 2010-07-14 LAB — CBC WITH DIFFERENTIAL/PLATELET
BASO%: 0.4 % (ref 0.0–2.0)
EOS%: 1.3 % (ref 0.0–7.0)
HGB: 15.9 g/dL (ref 11.6–15.9)
LYMPH%: 21.2 % (ref 14.0–49.7)
MCH: 36.8 pg — ABNORMAL HIGH (ref 25.1–34.0)
Platelets: 647 10*3/uL — ABNORMAL HIGH (ref 145–400)
WBC: 8.6 10*3/uL (ref 3.9–10.3)

## 2010-09-08 ENCOUNTER — Ambulatory Visit: Payer: Self-pay | Admitting: Oncology

## 2010-09-10 LAB — COMPREHENSIVE METABOLIC PANEL
Albumin: 4.4 g/dL (ref 3.5–5.2)
Calcium: 9.5 mg/dL (ref 8.4–10.5)
Creatinine, Ser: 0.93 mg/dL (ref 0.40–1.20)
Glucose, Bld: 89 mg/dL (ref 70–99)
Potassium: 4 mEq/L (ref 3.5–5.3)

## 2010-09-10 LAB — CBC WITH DIFFERENTIAL/PLATELET
HGB: 16.1 g/dL — ABNORMAL HIGH (ref 11.6–15.9)
LYMPH%: 25.9 % (ref 14.0–49.7)
MCHC: 34.4 g/dL (ref 31.5–36.0)
MCV: 104 fL — ABNORMAL HIGH (ref 79.5–101.0)
MONO#: 0.6 10*3/uL (ref 0.1–0.9)
NEUT#: 4.7 10*3/uL (ref 1.5–6.5)
NEUT%: 62.4 % (ref 38.4–76.8)
RBC: 4.5 10*6/uL (ref 3.70–5.45)
RDW: 13.1 % (ref 11.2–14.5)

## 2010-10-27 NOTE — Miscellaneous (Signed)
Summary: [facility] Previsit/prep  Clinical Lists Changes  Medications: Added new medication of MOVIPREP 100 GM  SOLR (PEG-KCL-NACL-NASULF-NA ASC-C) As per prep instructions. - Signed Rx of MOVIPREP 100 GM  SOLR (PEG-KCL-NACL-NASULF-NA ASC-C) As per prep instructions.;  #1 x 0;  Signed;  Entered by: Wyona Almas RN;  Authorized by: Louis Meckel MD;  Method used: Faxed to [id number], [address] [place], [place]  [address], Ph: [phone], Fax: [phone] Allergies: Added new allergy or adverse reaction of * CONTRAST DYE Added new allergy or adverse reaction of CODEINE Added new allergy or adverse reaction of PENICILLIN Observations: Added new observation of NKA: F (02/26/2010 10:27)    Prescriptions: MOVIPREP 100 GM  SOLR (PEG-KCL-NACL-NASULF-NA ASC-C) As per prep instructions.  #1 x 0   Entered by:   Wyona Almas RN   Authorized by:   Louis Meckel MD   Signed by:   Wyona Almas RN on 02/26/2010   Method used:   Faxed to ...       [id number] (retail)       [address]       [place], [place]  [address]       Ph: [phone]       Fax: [phone]   RxID:   [id number]

## 2010-10-27 NOTE — Letter (Signed)
Summary: [facility] Instructions  [facility]  [address] [place], [place] [address]   Phone: [phone]  Fax: [phone]       Anna Fuller    1945/10/20    MRN: [id number]        Procedure Day [name]: [name]  03/12/10     Arrival Time:  7:30am     Procedure Time:  8:30am     Location of Procedure:                    _X _   [facility]oscopy Center (4th Floor)                        PREPARATION FOR COLONOSCOPY WITH MOVIPREP   Starting 5 days prior to your procedure SATURDAY 03/07/10  do not eat nuts, seeds, popcorn, corn, beans, peas,  salads, or any raw vegetables.  Do not take any fiber supplements (e.g. Metamucil, Citrucel, and Benefiber).  THE DAY BEFORE YOUR PROCEDURE         DATE:  [facility]  03/11/10  1.  Drink clear liquids the entire day-NO SOLID FOOD  2.  Do not drink anything colored red or purple.  Avoid juices with pulp.  No orange juice.  3.  Drink at least 64 oz. (8 glasses) of fluid/clear liquids during the day to prevent dehydration and help the prep work efficiently.  CLEAR LIQUIDS INCLUDE: Water Jello Ice Popsicles Tea (sugar ok, no milk/cream) Powdered fruit flavored drinks Coffee (sugar ok, no milk/cream) Gatorade Juice: apple, white grape, white cranberry  Lemonade Clear bullion, consomm, broth Carbonated beverages (any kind) Strained chicken noodle soup Hard Candy                             4.  In the morning, mix first dose of MoviPrep solution:    Empty 1 Pouch A and 1 Pouch B into the disposable container    Add lukewarm drinking water to the top line of the container. Mix to dissolve    Refrigerate (mixed solution should be used within 24 hrs)  5.  Begin drinking the prep at 5:00 p.m. The MoviPrep container is divided by 4 marks.   Every 15 minutes drink the solution down to the next mark (approximately 8 oz) until the full liter is complete.   6.  Follow completed prep with 16 oz of clear liquid of your  choice (Nothing red or purple).  Continue to drink clear liquids until bedtime.  7.  Before going to bed, mix second dose of MoviPrep solution:    Empty 1 Pouch A and 1 Pouch B into the disposable container    Add lukewarm drinking water to the top line of the container. Mix to dissolve    Refrigerate  THE DAY OF YOUR PROCEDURE      DATE:  THURSDAY  03/12/10  Beginning at  3:30 a.m. (5 hours before procedure):         1. Every 15 minutes, drink the solution down to the next mark (approx 8 oz) until the full liter is complete.  2. Follow completed prep with 16 oz. of clear liquid of your choice.    3. You may drink clear liquids until 6:30am  (2 HOURS BEFORE PROCEDURE).   MEDICATION INSTRUCTIONS  Unless otherwise instructed, you should take regular prescription medications with a small sip of water   as early  as possible the morning of your procedure.            OTHER INSTRUCTIONS  You will need a responsible adult at least 67 years of age to accompany you and drive you home.   This person must remain in the waiting room during your procedure.  Wear loose fitting clothing that is easily removed.  Leave jewelry and other valuables at home.  However, you may wish to bring a book to read or  an iPod/MP3 player to listen to music as you wait for your procedure to start.  Remove all body piercing jewelry and leave at home.  Total time from sign-in until discharge is approximately 2-3 hours.  You should go home directly after your procedure and rest.  You can resume normal activities the  day after your procedure.  The day of your procedure you should not:   Drive   Make legal decisions   Operate machinery   Drink alcohol   Return to work  You will receive specific instructions about eating, activities and medications before you leave.    The above instructions have been reviewed and explained to me by   Wyona Almas RN  February 26, 2010 11:18 AM     I  fully understand and can verbalize these instructions _____________________________ Date _________

## 2010-10-27 NOTE — Letter (Signed)
Summary: Patient Notice- Polyp Results   Gastroenterology  [address] [place], [place] [address]   Phone: [phone]  Fax: [phone]        March 19, 2010 MRN: [id number]    Anna Fuller [address] [place], [place]  [address]    Dear [name],  I am pleased to inform you that the colon polyp(s) removed during your recent colonoscopy was (were) found to be benign (no cancer detected) upon pathologic examination.  I recommend you have a repeat colonoscopy examination in _5 years to look for recurrent polyps, as having colon polyps increases your risk for having recurrent polyps or even colon cancer in the future.  Should you develop new or worsening symptoms of abdominal pain, bowel habit changes or bleeding from the rectum or bowels, please schedule an evaluation with either your primary care physician or with me.  Additional information/recommendations:  __ No further action with gastroenterology is needed at this time. Please      follow-up with your primary care physician for your other healthcare      needs.  __ Please call [phone] to schedule a return visit to review your      situation.  __ Please keep your follow-up visit as already scheduled.  x__ Continue treatment plan as outlined the day of your exam.  Please call us if you are having persistent problems or have questions about your condition that have not been fully answered at this time.  Sincerely,  Louis Meckel MD  This letter has been electronically signed by your physician.  Appended Document: Patient Notice- Polyp Results letter mailed 6.24.11

## 2010-10-27 NOTE — Procedures (Signed)
Summary: Colonoscopy  Patient: [name] Note: All result statuses are Final unless otherwise noted.  Tests: (1) Colonoscopy (COL)   COL Colonoscopy           DONE     [facility]     [address]     [place], [place]  [address]           COLONOSCOPY PROCEDURE REPORT           PATIENT:  Anna Fuller, Anna Fuller  MR#:  [id number]     BIRTHDATE:  [birth year], 63 yrs. old  GENDER:  female           ENDOSCOPIST:  Barbette Hair. Arlyce Dice, MD     Referred by:           PROCEDURE DATE:  03/12/2010     PROCEDURE:  Colonoscopy with snare polypectomy, Colon with cold     biopsy polypectomy     ASA CLASS:  Class II     INDICATIONS:  1) screening  2) history of pre-cancerous     (adenomatous) colon polyps           MEDICATIONS:   Fentanyl 50 mcg IV, Versed 6 mg IV           DESCRIPTION OF PROCEDURE:   After the risks benefits and     alternatives of the procedure were thoroughly explained, informed     consent was obtained.  Digital rectal exam was performed and     revealed no abnormalities.   The LB CF-[id number] endoscope     was introduced through the anus and advanced to the cecum, which     was identified by both the appendix and ileocecal valve, without     limitations.  The quality of the prep was excellent, using     MoviPrep.  The instrument was then slowly withdrawn as the colon     was fully examined.     <<PROCEDUREIMAGES>>           FINDINGS:  A diminutive polyp was found in the cecum. It was 2 mm     in size. The polyp was removed using cold biopsy forceps.  A     second sessile polyp was found in the cecum. It was 3 mm in size.     Polyp was snared without cautery. Retrieval was successful (see     image3). snare polyp  Mild diverticulosis was found in the sigmoid     colon (see image6).  This was otherwise a normal examination of     the colon (see image1, image2, image4, image5, image7, and     image8).   Retroflexed views in the rectum revealed no  abnormalities.    The time to cecum =  2.5  minutes. The scope was     then withdrawn (time =  6.75  min) from the patient and the     procedure completed.           COMPLICATIONS:  None           ENDOSCOPIC IMPRESSION:     1) 2 mm diminutive polyp in the cecum     2) 3 mm sessile polyp in the cecum     3) Mild diverticulosis in the sigmoid colon     4) Otherwise normal examination     RECOMMENDATIONS:     1) If the polyp(s) removed today are proven to be adenomatous     (pre-cancerous) polyps,  you will need a repeat colonoscopy in 5     years. Otherwise you should continue to follow colorectal cancer     screening guidelines for "routine risk" patients with colonoscopy     in 10 years.           REPEAT EXAM:  You will receive a letter from Dr. Arlyce Dice in 1-2     weeks, after reviewing the final pathology, with followup     recommendations.           ______________________________     Barbette Hair Arlyce Dice, MD           CC: Catha Gosselin, MD           n.     Rosalie DoctorBarbette Hair. Kaplan at 03/12/2010 09:09 AM           Britta Mccreedy, [id number]  Note: An exclamation mark (!) indicates a result that was not dispersed into the flowsheet. Document Creation Date: 03/12/2010 9:09 AM _______________________________________________________________________  (1) Order result status: Final Collection or observation date-time: 03/12/2010 09:02 Requested date-time:  Receipt date-time:  Reported date-time:  Referring Physician:   Ordering Physician: Melvia Heaps [phone]) Specimen Source:  Source: Launa Grill Order Number: [id number] Lab site:   Appended Document: Colonoscopy     Procedures Next Due Date:    Colonoscopy: 02/2015

## 2010-10-27 NOTE — Letter (Signed)
Summary: Previsit letter  [facility]  [address] [place], [place] [address]   Phone: [phone]  Fax: [phone]       01/20/2010 MRN: [id number]  Anna Fuller [address] [place], [place]  [address]  Dear [name],  Welcome to the Gastroenterology Division at [facility].    You are scheduled to see a nurse for your pre-procedure visit on 03-10-10 at 10:30a.m. on the 3rd floor at [facility], [address]  We ask that you try to arrive at our office 15 minutes prior to your appointment time to allow for check-in.  Your nurse visit will consist of discussing your medical and surgical history, your immediate family medical history, and your medications.    Please bring a complete list of all your medications or, if you prefer, bring the medication bottles and we will list them.  We will need to be aware of both prescribed and over the counter drugs.  We will need to know exact dosage information as well.  If you are on blood thinners (Coumadin, Plavix, Aggrenox, Ticlid, etc.) please call our office today/prior to your appointment, as we need to consult with your physician about holding your medication.   Please be prepared to read and sign documents such as consent forms, a financial agreement, and acknowledgement forms.  If necessary, and with your consent, a friend or relative is welcome to sit-in on the nurse visit with you.  Please bring your insurance card so that we may make a copy of it.  If your insurance requires a referral to see a specialist, please bring your referral form from your primary care physician.  No co-pay is required for this nurse visit.     If you cannot keep your appointment, please call [phone] to cancel or reschedule prior to your appointment date.  This allows [place] the opportunity to schedule an appointment for another patient in need of care.    Thank you for choosing [facility] for your medical  needs.  We appreciate the opportunity to care for you.  Please visit [place] at our website  to learn more about our practice.                     Sincerely.                                                                                                                   The Gastroenterology Division

## 2010-12-10 ENCOUNTER — Encounter (HOSPITAL_BASED_OUTPATIENT_CLINIC_OR_DEPARTMENT_OTHER): Payer: Medicare Other | Admitting: Oncology

## 2010-12-10 ENCOUNTER — Other Ambulatory Visit: Payer: Self-pay | Admitting: Oncology

## 2010-12-10 DIAGNOSIS — D473 Essential (hemorrhagic) thrombocythemia: Secondary | ICD-10-CM

## 2010-12-10 DIAGNOSIS — D7589 Other specified diseases of blood and blood-forming organs: Secondary | ICD-10-CM

## 2010-12-10 DIAGNOSIS — Z23 Encounter for immunization: Secondary | ICD-10-CM

## 2010-12-10 DIAGNOSIS — D47Z9 Other specified neoplasms of uncertain behavior of lymphoid, hematopoietic and related tissue: Secondary | ICD-10-CM

## 2010-12-10 LAB — CBC WITH DIFFERENTIAL/PLATELET
HGB: 16 g/dL — ABNORMAL HIGH (ref 11.6–15.9)
MCHC: 34.1 g/dL (ref 31.5–36.0)
MCV: 106.4 fL — ABNORMAL HIGH (ref 79.5–101.0)
MONO%: 8.4 % (ref 0.0–14.0)
NEUT#: 5.3 10*3/uL (ref 1.5–6.5)
RBC: 4.41 10*6/uL (ref 3.70–5.45)
RDW: 13.3 % (ref 11.2–14.5)
WBC: 7.6 10*3/uL (ref 3.9–10.3)
lymph#: 1.6 10*3/uL (ref 0.9–3.3)

## 2010-12-10 LAB — COMPREHENSIVE METABOLIC PANEL
Alkaline Phosphatase: 61 U/L (ref 39–117)
Creatinine, Ser: 1.11 mg/dL (ref 0.40–1.20)
Potassium: 4.3 mEq/L (ref 3.5–5.3)
Sodium: 137 mEq/L (ref 135–145)
Total Bilirubin: 0.5 mg/dL (ref 0.3–1.2)
Total Protein: 6.9 g/dL (ref 6.0–8.3)

## 2011-02-09 ENCOUNTER — Encounter (HOSPITAL_BASED_OUTPATIENT_CLINIC_OR_DEPARTMENT_OTHER): Payer: Medicare Other | Admitting: Oncology

## 2011-02-09 ENCOUNTER — Other Ambulatory Visit: Payer: Self-pay | Admitting: Oncology

## 2011-02-09 DIAGNOSIS — D47Z9 Other specified neoplasms of uncertain behavior of lymphoid, hematopoietic and related tissue: Secondary | ICD-10-CM

## 2011-02-09 DIAGNOSIS — D473 Essential (hemorrhagic) thrombocythemia: Secondary | ICD-10-CM

## 2011-02-09 DIAGNOSIS — D7589 Other specified diseases of blood and blood-forming organs: Secondary | ICD-10-CM

## 2011-02-09 LAB — COMPREHENSIVE METABOLIC PANEL
ALT: 19 U/L (ref 0–35)
AST: 29 U/L (ref 0–37)
Alkaline Phosphatase: 53 U/L (ref 39–117)
Chloride: 105 mEq/L (ref 96–112)
Creatinine, Ser: 0.96 mg/dL (ref 0.40–1.20)
Potassium: 3.9 mEq/L (ref 3.5–5.3)
Total Bilirubin: 0.5 mg/dL (ref 0.3–1.2)
Total Protein: 6.7 g/dL (ref 6.0–8.3)

## 2011-02-09 LAB — CBC WITH DIFFERENTIAL/PLATELET
BASO%: 0.8 % (ref 0.0–2.0)
EOS%: 1 % (ref 0.0–7.0)
Eosinophils Absolute: 0.1 10*3/uL (ref 0.0–0.5)
HGB: 15.5 g/dL (ref 11.6–15.9)
MCV: 107.8 fL — ABNORMAL HIGH (ref 79.5–101.0)
MONO%: 7 % (ref 0.0–14.0)
NEUT#: 4.5 10*3/uL (ref 1.5–6.5)
NEUT%: 67.9 % (ref 38.4–76.8)
Platelets: 479 10*3/uL — ABNORMAL HIGH (ref 145–400)
RBC: 4.2 10*6/uL (ref 3.70–5.45)
WBC: 6.7 10*3/uL (ref 3.9–10.3)

## 2011-02-09 NOTE — Op Note (Signed)
NAME:  Anna Fuller, Anna Fuller              ACCOUNT NO.:  [id number]   MEDICAL RECORD NO.:  [id number]          PATIENT TYPE:  OUT   LOCATION:  OMED                         FACILITY:  [facility]   PHYSICIAN:  Firas N. Shadad        DATE OF BIRTH:  1946/03/04   DATE OF PROCEDURE:  05/01/2007  DATE OF DISCHARGE:                               OPERATIVE REPORT   This is a bone marrow biopsy and aspirate the procedure note.   INDICATIONS FOR PROCEDURE:  This is 65 year old female with  thrombocytosis.  This is to rule out myeloproliferative disorder such as  essential thrombocytosis.   DESCRIPTION OF PROCEDURE:  [name] brought into short stay suite.  Informed consent was obtained and after obtaining IV access and placed  on cardiac monitor, the patient was placed in the decubitus position.  The skin was prepped and draped in a sterile fashion.  Conscious  sedation was obtained utilizing 4 mg of Versed, 25 mg of Demerol.  The  skin, subcutaneous tissue and periosteum was anesthetized using 2%  lidocaine.  Using the Jamshidi needle, aspirate was obtained without any  difficulty.  Another incision was made after the skin was prepped and  draped in sterile fashion and anesthesia was again obtained using 2%  lidocaine.  The biopsy was obtained utilizing the Jamshidi needle.  The  patient tolerated procedure well.  There was no complications.  No  bleeding.  The specimen was sent for flow and cytogenetics.  The patient  was instructed to lay down flat for next 30-45 minutes.  Results will be  discussed in the office by the end of this week.           ______________________________  Blenda Nicely. [facility]  Electronically Signed     FNS/MEDQ  D:  05/01/2007  T:  05/01/2007  Job:  [id number]

## 2011-04-14 ENCOUNTER — Other Ambulatory Visit: Payer: Self-pay | Admitting: Oncology

## 2011-04-14 ENCOUNTER — Encounter (HOSPITAL_BASED_OUTPATIENT_CLINIC_OR_DEPARTMENT_OTHER): Payer: Medicare Other | Admitting: Oncology

## 2011-04-14 DIAGNOSIS — D47Z9 Other specified neoplasms of uncertain behavior of lymphoid, hematopoietic and related tissue: Secondary | ICD-10-CM

## 2011-04-14 DIAGNOSIS — D7589 Other specified diseases of blood and blood-forming organs: Secondary | ICD-10-CM

## 2011-04-14 DIAGNOSIS — D473 Essential (hemorrhagic) thrombocythemia: Secondary | ICD-10-CM

## 2011-04-14 LAB — CBC WITH DIFFERENTIAL/PLATELET
BASO%: 0.6 % (ref 0.0–2.0)
Basophils Absolute: 0 10*3/uL (ref 0.0–0.1)
EOS%: 1.7 % (ref 0.0–7.0)
Eosinophils Absolute: 0.1 10*3/uL (ref 0.0–0.5)
LYMPH%: 29.1 % (ref 14.0–49.7)
MONO#: 0.5 10*3/uL (ref 0.1–0.9)
NEUT%: 61.4 % (ref 38.4–76.8)

## 2011-04-14 LAB — COMPREHENSIVE METABOLIC PANEL
CO2: 24 mEq/L (ref 19–32)
Calcium: 9.5 mg/dL (ref 8.4–10.5)
Chloride: 105 mEq/L (ref 96–112)
Glucose, Bld: 88 mg/dL (ref 70–99)
Sodium: 139 mEq/L (ref 135–145)

## 2011-06-02 ENCOUNTER — Other Ambulatory Visit: Payer: Self-pay | Admitting: Family Medicine

## 2011-06-02 DIAGNOSIS — Z1231 Encounter for screening mammogram for malignant neoplasm of breast: Secondary | ICD-10-CM

## 2011-06-15 ENCOUNTER — Other Ambulatory Visit: Payer: Self-pay | Admitting: Oncology

## 2011-06-15 ENCOUNTER — Encounter (HOSPITAL_BASED_OUTPATIENT_CLINIC_OR_DEPARTMENT_OTHER): Payer: Medicare Other | Admitting: Oncology

## 2011-06-15 DIAGNOSIS — Z7982 Long term (current) use of aspirin: Secondary | ICD-10-CM

## 2011-06-15 DIAGNOSIS — D47Z9 Other specified neoplasms of uncertain behavior of lymphoid, hematopoietic and related tissue: Secondary | ICD-10-CM

## 2011-06-15 DIAGNOSIS — D473 Essential (hemorrhagic) thrombocythemia: Secondary | ICD-10-CM

## 2011-06-15 DIAGNOSIS — D7589 Other specified diseases of blood and blood-forming organs: Secondary | ICD-10-CM

## 2011-06-15 LAB — COMPREHENSIVE METABOLIC PANEL
ALT: 19 U/L (ref 0–35)
AST: 27 U/L (ref 0–37)
Creatinine, Ser: 1.03 mg/dL (ref 0.50–1.10)
Total Bilirubin: 0.5 mg/dL (ref 0.3–1.2)

## 2011-06-15 LAB — CBC WITH DIFFERENTIAL/PLATELET
EOS%: 1.3 % (ref 0.0–7.0)
Eosinophils Absolute: 0.1 10*3/uL (ref 0.0–0.5)
LYMPH%: 28.1 % (ref 14.0–49.7)
MCH: 38.7 pg — ABNORMAL HIGH (ref 25.1–34.0)
MCHC: 35 g/dL (ref 31.5–36.0)
MCV: 110.7 fL — ABNORMAL HIGH (ref 79.5–101.0)
MONO%: 7.1 % (ref 0.0–14.0)
NEUT#: 3.7 10*3/uL (ref 1.5–6.5)
Platelets: 450 10*3/uL — ABNORMAL HIGH (ref 145–400)
RBC: 4.02 10*6/uL (ref 3.70–5.45)

## 2011-07-08 ENCOUNTER — Ambulatory Visit
Admission: RE | Admit: 2011-07-08 | Discharge: 2011-07-08 | Disposition: A | Discharge status: Home / Self Care | Payer: Medicare Other | Source: Ambulatory Visit | Attending: Family Medicine | Admitting: Family Medicine

## 2011-07-08 DIAGNOSIS — Z1231 Encounter for screening mammogram for malignant neoplasm of breast: Secondary | ICD-10-CM

## 2011-07-12 LAB — BONE MARROW EXAM

## 2011-07-12 LAB — DIFFERENTIAL
Eosinophils Absolute: 0.3
Eosinophils Relative: 3
Lymphs Abs: 2.7
Monocytes Absolute: 0.7

## 2011-07-12 LAB — CBC
HCT: 43
Hemoglobin: 14.8
MCV: 91.8
Platelets: 518 — ABNORMAL HIGH
RDW: 14.9 — ABNORMAL HIGH

## 2011-08-05 ENCOUNTER — Encounter: Payer: Self-pay | Admitting: *Deleted

## 2011-08-12 ENCOUNTER — Other Ambulatory Visit: Payer: Self-pay | Admitting: Oncology

## 2011-08-12 DIAGNOSIS — D473 Essential (hemorrhagic) thrombocythemia: Secondary | ICD-10-CM

## 2011-08-12 DIAGNOSIS — E039 Hypothyroidism, unspecified: Secondary | ICD-10-CM

## 2011-08-17 ENCOUNTER — Ambulatory Visit (HOSPITAL_BASED_OUTPATIENT_CLINIC_OR_DEPARTMENT_OTHER): Payer: Medicare Other | Admitting: Oncology

## 2011-08-17 ENCOUNTER — Other Ambulatory Visit (HOSPITAL_BASED_OUTPATIENT_CLINIC_OR_DEPARTMENT_OTHER): Payer: Medicare Other | Admitting: Lab

## 2011-08-17 ENCOUNTER — Other Ambulatory Visit: Payer: Self-pay | Admitting: Oncology

## 2011-08-17 VITALS — BP 123/66 | HR 70 | Temp 97.8°F | Ht 62.5 in | Wt 145.4 lb

## 2011-08-17 DIAGNOSIS — D473 Essential (hemorrhagic) thrombocythemia: Secondary | ICD-10-CM

## 2011-08-17 DIAGNOSIS — E039 Hypothyroidism, unspecified: Secondary | ICD-10-CM

## 2011-08-17 LAB — CBC WITH DIFFERENTIAL/PLATELET
BASO%: 0.6 % (ref 0.0–2.0)
EOS%: 1 % (ref 0.0–7.0)
HCT: 43.6 % (ref 34.8–46.6)
LYMPH%: 31.4 % (ref 14.0–49.7)
MCH: 38.6 pg — ABNORMAL HIGH (ref 25.1–34.0)
MCHC: 34.4 g/dL (ref 31.5–36.0)
NEUT%: 59.7 % (ref 38.4–76.8)
Platelets: 442 10*3/uL — ABNORMAL HIGH (ref 145–400)
lymph#: 1.9 10*3/uL (ref 0.9–3.3)

## 2011-08-17 LAB — LIPID PANEL
Cholesterol: 193 mg/dL (ref 0–200)
HDL: 79 mg/dL (ref >39)
Total CHOL/HDL Ratio: 2.4 Ratio

## 2011-08-17 LAB — COMPREHENSIVE METABOLIC PANEL
BUN: 13 mg/dL (ref 6–23)
CO2: 27 mEq/L (ref 19–32)
Calcium: 9.7 mg/dL (ref 8.4–10.5)
Chloride: 104 mEq/L (ref 96–112)
Creatinine, Ser: 0.95 mg/dL (ref 0.50–1.10)
Glucose, Bld: 89 mg/dL (ref 70–99)
Total Bilirubin: 0.5 mg/dL (ref 0.3–1.2)

## 2011-08-17 LAB — TSH: TSH: 1.941 u[IU]/mL (ref 0.350–4.500)

## 2011-08-17 NOTE — Progress Notes (Signed)
Hematology and Oncology Follow Up Visit  PRENTISS HAMMETT [id number] [birth year] 65 y.o. 08/17/2011 9:22 AM  CC: [name], M.D.    Principle Diagnosis:This is a 65 year old female with essential thrombocythemia.  She has JAK2 positive myeloproliferative disorder diagnosed 2007.   Prior Therapy: 1. She is status post anagrelide therapy discontinued due to do intolerance. 2. Status post bone marrow biopsy in August 2008 which showed early signs of myeloproliferative disorder.  Current therapy :She is on hydroxyurea alternating 1000 and 1500 mg every other day, has been treated with hydroxyurea for the last 3 years or so.  Interim History:  Ms. Bhattacharyya presents today for a followup visit.  She since the last time I saw her had not really reported any major problems at this time.  Had not reported any nausea.  Did not report any vomiting.  She does report grade 2 fatigue related to her hydroxyurea.  She had not reported any oral ulcers.  Did not report any lesions.  Had not had any major changes in her performance status or activity level.  She has continued to enjoy good quality of life at the time being. No bleeding or thrombotic events noted. No new complications with Hydroxyurea.   Medications: I have reviewed the patient's current medications. Current outpatient prescriptions:aspirin 81 MG tablet, Take 81 mg by mouth daily.  , Disp: , Rfl: ;  estrogens, conjugated, (PREMARIN) 0.625 MG tablet, Take 0.625 mg by mouth daily. Take daily for 21 days then do not take for 7 days. , Disp: , Rfl: ;  hydroxyurea (HYDREA) 500 MG capsule, Take 500 mg by mouth 2 (two) times daily. May take with food to minimize GI side effects. , Disp: , Rfl:  LORazepam (ATIVAN) 1 MG tablet, Take 1 mg by mouth every 8 (eight) hours.  , Disp: , Rfl: ;  simvastatin (ZOCOR) 40 MG tablet, Take 40 mg by mouth at bedtime.  , Disp: , Rfl:   Allergies:  Allergies  Allergen Reactions  . Codeine     REACTION: severe  nausea/vomiting  . Penicillins     Past Medical History, Surgical history, Social history, and Family History were reviewed and updated.  Review of Systems: Constitutional:  Negative for fever, chills, night sweats, anorexia, weight loss, pain. Cardiovascular: no chest pain or dyspnea on exertion Respiratory: no cough, shortness of breath, or wheezing Neurological: no TIA or stroke symptoms Dermatological: negative ENT: negative Skin: negative. Gastrointestinal: no abdominal pain, change in bowel habits, or black or bloody stools Genito-Urinary: no dysuria, trouble voiding, or hematuria Hematological and Lymphatic: negative Breast: negative for breast lumps negative Musculoskeletal: negative Remaining ROS negative. Physical Exam: Blood pressure 123/66, pulse 70, temperature 97.8 F (36.6 C), temperature source Oral, height 5' 2.5" (1.588 m), weight 145 lb 6.4 oz (65.953 kg). ECOG: 1 General appearance: alert Head: Normocephalic, without obvious abnormality, atraumatic Neck: no adenopathy, no carotid bruit, no JVD, supple, symmetrical, trachea midline and thyroid not enlarged, symmetric, no tenderness/mass/nodules Lymph nodes: Cervical, supraclavicular, and axillary nodes normal. Heart:regular rate and rhythm, S1, S2 normal, no murmur, click, rub or gallop Lung:chest clear, no wheezing, rales, normal symmetric air entry, Heart exam - S1, S2 normal, no murmur, no gallop, rate regular Abdomin: soft, non-tender, without masses or organomegaly EXT:no erythema, induration, or nodules   Lab Results: Lab Results  Component Value Date   WBC 6.1 08/17/2011   HGB 15.0 08/17/2011   HCT 43.6 08/17/2011   MCV 112.2* 08/17/2011   PLT 442* 08/17/2011  Chemistry      Component Value Date/Time   NA 140 06/15/2011 0841   NA 140 06/15/2011 0841   K 4.1 06/15/2011 0841   K 4.1 06/15/2011 0841   CL 103 06/15/2011 0841   CL 103 06/15/2011 0841   CO2 25 06/15/2011 0841   CO2 25 06/15/2011  0841   BUN 14 06/15/2011 0841   BUN 14 06/15/2011 0841   CREATININE 1.03 06/15/2011 0841   CREATININE 1.03 06/15/2011 0841      Component Value Date/Time   CALCIUM 9.3 06/15/2011 0841   CALCIUM 9.3 06/15/2011 0841   ALKPHOS 57 06/15/2011 0841   ALKPHOS 57 06/15/2011 0841   AST 27 06/15/2011 0841   AST 27 06/15/2011 0841   ALT 19 06/15/2011 0841   ALT 19 06/15/2011 0841   BILITOT 0.5 06/15/2011 0841   BILITOT 0.5 06/15/2011 0841       Impression and Plan:  This is a pleasant 65 year old female with the following issues. 1. Myeloproliferative disorder presenting with essential thrombocythemia.  She is currently on hydroxyurea alternating between 1000 and 1500 mg cell.  Platelet count is under excellent control at this time.  The plan is to continue on the current dose and regimen and continue to monitor every 2 months. 2. Thrombosis prophylaxis.  Continue to be on aspirin.       [facility], MD 11/20/20129:22 AM

## 2011-10-15 ENCOUNTER — Encounter: Payer: Self-pay | Admitting: *Deleted

## 2011-10-19 ENCOUNTER — Ambulatory Visit (HOSPITAL_BASED_OUTPATIENT_CLINIC_OR_DEPARTMENT_OTHER): Payer: Medicare Other | Admitting: Oncology

## 2011-10-19 ENCOUNTER — Other Ambulatory Visit: Payer: Medicare Other | Admitting: Lab

## 2011-10-19 VITALS — BP 114/62 | HR 64 | Temp 97.7°F | Ht 62.5 in | Wt 144.6 lb

## 2011-10-19 DIAGNOSIS — D473 Essential (hemorrhagic) thrombocythemia: Secondary | ICD-10-CM

## 2011-10-19 LAB — CBC WITH DIFFERENTIAL/PLATELET
Basophils Absolute: 0 10*3/uL (ref 0.0–0.1)
Eosinophils Absolute: 0 10*3/uL (ref 0.0–0.5)
HGB: 14.6 g/dL (ref 11.6–15.9)
LYMPH%: 25 % (ref 14.0–49.7)
MCV: 112.2 fL — ABNORMAL HIGH (ref 79.5–101.0)
MONO#: 0.4 10*3/uL (ref 0.1–0.9)
MONO%: 6.4 % (ref 0.0–14.0)
NEUT#: 4.5 10*3/uL (ref 1.5–6.5)
Platelets: 436 10*3/uL — ABNORMAL HIGH (ref 145–400)

## 2011-10-19 LAB — COMPREHENSIVE METABOLIC PANEL
Albumin: 4.5 g/dL (ref 3.5–5.2)
Alkaline Phosphatase: 62 U/L (ref 39–117)
BUN: 11 mg/dL (ref 6–23)
CO2: 25 mEq/L (ref 19–32)
Glucose, Bld: 92 mg/dL (ref 70–99)
Potassium: 3.9 mEq/L (ref 3.5–5.3)
Total Bilirubin: 0.5 mg/dL (ref 0.3–1.2)

## 2011-10-19 NOTE — Progress Notes (Signed)
Hematology and Oncology Follow Up Visit  Anna Fuller [id number] 1946/05/03 65 y.o. 10/19/2011 10:20 AM  CC: Anna Fuller, M.D.    Principle Diagnosis:This is a 66 year old female with essential thrombocythemia.  She has JAK2 positive myeloproliferative disorder diagnosed 2007.   Prior Therapy: 1. She is status post anagrelide therapy discontinued due to do intolerance. 2. Status post bone marrow biopsy in August 2008 which showed early signs of myeloproliferative disorder.  Current therapy :She is on hydroxyurea alternating 1000 and 1500 mg every other day, has been treated with hydroxyurea for the last 3 years or so.  Interim History:  [name] presents today for a followup visit.  She since the last time I saw her had not really reported any major problems at this time.  Had not reported any nausea.  Did not report any vomiting.  She does report grade 2 fatigue related to her hydroxyurea.  She had not reported any oral ulcers.  Did not report any lesions.  Had not had any major changes in her performance status or activity level.  She has continued to enjoy good quality of life at the time being. No bleeding or thrombotic events noted. No new complications with Hydroxyurea. Very rare episodes of epistaxis.   Medications: I have reviewed the patient's current medications. Current outpatient prescriptions:aspirin 81 MG tablet, Take 81 mg by mouth daily.  , Disp: , Rfl: ;  estrogens, conjugated, (PREMARIN) 0.625 MG tablet, Take 0.625 mg by mouth daily. Take daily for 21 days then do not take for 7 days. , Disp: , Rfl: ;  hydroxyurea (HYDREA) 500 MG capsule, Take 500 mg by mouth 2 (two) times daily. May take with food to minimize GI side effects. , Disp: , Rfl:  LORazepam (ATIVAN) 1 MG tablet, Take 1 mg by mouth every 8 (eight) hours.  , Disp: , Rfl: ;  simvastatin (ZOCOR) 40 MG tablet, Take 40 mg by mouth at bedtime.  , Disp: , Rfl:   Allergies:  Allergies  Allergen Reactions  .  Codeine     REACTION: severe nausea/vomiting  . Penicillins     Past Medical History, Surgical history, Social history, and Family History were reviewed and updated.  Review of Systems: Constitutional:  Negative for fever, chills, night sweats, anorexia, weight loss, pain. Cardiovascular: no chest pain or dyspnea on exertion Respiratory: no cough, shortness of breath, or wheezing Neurological: no TIA or stroke symptoms Dermatological: negative ENT: negative Skin: negative. Gastrointestinal: no abdominal pain, change in bowel habits, or black or bloody stools Genito-Urinary: no dysuria, trouble voiding, or hematuria Hematological and Lymphatic: negative Breast: negative for breast lumps negative Musculoskeletal: negative Remaining ROS negative. Physical Exam: Blood pressure 114/62, pulse 64, temperature 97.7 F (36.5 C), temperature source Oral, height 5' 2.5" (1.588 m), weight 144 lb 9.6 oz (65.59 kg). ECOG: 1 General appearance: alert Head: Normocephalic, without obvious abnormality, atraumatic Neck: no adenopathy, no carotid bruit, no JVD, supple, symmetrical, trachea midline and thyroid not enlarged, symmetric, no tenderness/mass/nodules Lymph nodes: Cervical, supraclavicular, and axillary nodes normal. Heart:regular rate and rhythm, S1, S2 normal, no murmur, click, rub or gallop Lung:chest clear, no wheezing, rales, normal symmetric air entry, Heart exam - S1, S2 normal, no murmur, no gallop, rate regular Abdomin: soft, non-tender, without masses or organomegaly EXT:no erythema, induration, or nodules   Lab Results: Lab Results  Component Value Date   WBC 6.7 10/19/2011   HGB 14.6 10/19/2011   HCT 42.2 10/19/2011   MCV 112.2* 10/19/2011  PLT 436* 10/19/2011     Chemistry      Component Value Date/Time   NA 142 08/17/2011 0838   NA 142 08/17/2011 0838   NA 142 08/17/2011 0838   K 4.4 08/17/2011 0838   K 4.4 08/17/2011 0838   K 4.4 08/17/2011 0838   CL 104  08/17/2011 0838   CL 104 08/17/2011 0838   CL 104 08/17/2011 0838   CO2 27 08/17/2011 0838   CO2 27 08/17/2011 0838   CO2 27 08/17/2011 0838   BUN 13 08/17/2011 0838   BUN 13 08/17/2011 0838   BUN 13 08/17/2011 0838   CREATININE 0.95 08/17/2011 0838   CREATININE 0.95 08/17/2011 0838   CREATININE 0.95 08/17/2011 0838      Component Value Date/Time   CALCIUM 9.7 08/17/2011 0838   CALCIUM 9.7 08/17/2011 0838   CALCIUM 9.7 08/17/2011 0838   ALKPHOS 60 08/17/2011 0838   ALKPHOS 60 08/17/2011 0838   ALKPHOS 60 08/17/2011 0838   AST 25 08/17/2011 0838   AST 25 08/17/2011 0838   AST 25 08/17/2011 0838   ALT 17 08/17/2011 0838   ALT 17 08/17/2011 0838   ALT 17 08/17/2011 0838   BILITOT 0.5 08/17/2011 0838   BILITOT 0.5 08/17/2011 0838   BILITOT 0.5 08/17/2011 0838       Impression and Plan:  This is a pleasant 66 year old female with the following issues. 1. Myeloproliferative disorder presenting with essential thrombocythemia.  She is currently on hydroxyurea alternating between 1000 and 1500 mg cell.  Platelet count is under excellent control at this time.  The plan is to continue on the current dose and regimen and continue to monitor every 2 months. 2. Thrombosis prophylaxis.  Continue to be on aspirin.       [facility], MD 1/22/201310:20 AM

## 2011-10-20 ENCOUNTER — Encounter: Payer: Self-pay | Admitting: *Deleted

## 2011-12-16 ENCOUNTER — Other Ambulatory Visit (HOSPITAL_BASED_OUTPATIENT_CLINIC_OR_DEPARTMENT_OTHER): Payer: Medicare Other

## 2011-12-16 ENCOUNTER — Ambulatory Visit (HOSPITAL_BASED_OUTPATIENT_CLINIC_OR_DEPARTMENT_OTHER): Payer: Medicare Other | Admitting: Oncology

## 2011-12-16 ENCOUNTER — Telehealth: Payer: Self-pay | Admitting: Oncology

## 2011-12-16 VITALS — BP 119/57 | HR 65 | Temp 97.7°F | Ht 62.5 in | Wt 146.0 lb

## 2011-12-16 DIAGNOSIS — D473 Essential (hemorrhagic) thrombocythemia: Secondary | ICD-10-CM | POA: Diagnosis not present

## 2011-12-16 LAB — CBC WITH DIFFERENTIAL/PLATELET
Basophils Absolute: 0.1 10*3/uL (ref 0.0–0.1)
Eosinophils Absolute: 0.1 10*3/uL (ref 0.0–0.5)
HGB: 15.3 g/dL (ref 11.6–15.9)
MCV: 112.7 fL — ABNORMAL HIGH (ref 79.5–101.0)
MONO#: 0.4 10*3/uL (ref 0.1–0.9)
MONO%: 7 % (ref 0.0–14.0)
NEUT#: 4 10*3/uL (ref 1.5–6.5)
RDW: 13.1 % (ref 11.2–14.5)
WBC: 6.3 10*3/uL (ref 3.9–10.3)
lymph#: 1.7 10*3/uL (ref 0.9–3.3)

## 2011-12-16 LAB — COMPREHENSIVE METABOLIC PANEL
Albumin: 4.7 g/dL (ref 3.5–5.2)
BUN: 16 mg/dL (ref 6–23)
CO2: 26 mEq/L (ref 19–32)
Calcium: 9.5 mg/dL (ref 8.4–10.5)
Chloride: 104 mEq/L (ref 96–112)
Glucose, Bld: 84 mg/dL (ref 70–99)
Potassium: 4.3 mEq/L (ref 3.5–5.3)
Sodium: 140 mEq/L (ref 135–145)
Total Protein: 7 g/dL (ref 6.0–8.3)

## 2011-12-16 MED ORDER — HYDROXYUREA 500 MG PO CAPS: 500.0000 mg | ORAL_CAPSULE | ORAL | Status: DC

## 2011-12-16 NOTE — Telephone Encounter (Signed)
Gv pt appt for may2013 

## 2011-12-16 NOTE — Progress Notes (Signed)
Hematology and Oncology Follow Up Visit  Anna Fuller [id number] 22-Mar-1946 66 y.o. 12/16/2011 10:41 AM  CC: [name], M.D.    Principle Diagnosis:This is a 66 year old female with essential thrombocythemia.  She has JAK2 positive myeloproliferative disorder diagnosed 2007.   Prior Therapy: 1. She is status post anagrelide therapy discontinued due to do intolerance. 2. Status post bone marrow biopsy in August 2008 which showed early signs of myeloproliferative disorder.  Current therapy :She is on hydroxyurea alternating 1000 and 1500 mg every other day, has been treated with hydroxyurea for the last 3 years or so.  Interim History:  Anna Fuller presents today for a followup visit.  She since the last time I saw her had not really reported any major problems at this time.  Had not reported any nausea.  Did not report any vomiting.  She does report grade 1 fatigue related to her hydroxyurea.  She had not reported any oral ulcers.  Did not report any lesions.  Had not had any major changes in her performance status or activity level.  She has continued to enjoy good quality of life at the time being. No bleeding or thrombotic events noted. No new complications with Hydroxyurea. Very rare episodes of epistaxis continued since her last visit.    Medications: I have reviewed the patient's current medications. Current outpatient prescriptions:aspirin 81 MG tablet, Take 81 mg by mouth daily.  , Disp: , Rfl: ;  estrogens, conjugated, (PREMARIN) 0.625 MG tablet, Take 0.625 mg by mouth daily. Take daily for 21 days then do not take for 7 days. , Disp: , Rfl:  hydroxyurea (HYDREA) 500 MG capsule, Take 1 capsule (500 mg total) by mouth as directed. 1 tab during the day 2 tabs before sleep. May take with food to minimize GI side effects., Disp: 120 capsule, Rfl: 3;  LORazepam (ATIVAN) 1 MG tablet, Take 1 mg by mouth every 8 (eight) hours.  , Disp: , Rfl: ;  simvastatin (ZOCOR) 40 MG tablet, Take  40 mg by mouth at bedtime.  , Disp: , Rfl:   Allergies:  Allergies  Allergen Reactions  . Codeine     REACTION: severe nausea/vomiting  . Penicillins     Past Medical History, Surgical history, Social history, and Family History were reviewed and updated.  Review of Systems: Constitutional:  Negative for fever, chills, night sweats, anorexia, weight loss, pain. Cardiovascular: no chest pain or dyspnea on exertion Respiratory: no cough, shortness of breath, or wheezing Neurological: no TIA or stroke symptoms Dermatological: negative ENT: negative Skin: negative. Gastrointestinal: no abdominal pain, change in bowel habits, or black or bloody stools Genito-Urinary: no dysuria, trouble voiding, or hematuria Hematological and Lymphatic: negative Breast: negative for breast lumps negative Musculoskeletal: negative Remaining ROS negative. Physical Exam: Blood pressure 119/57, pulse 65, temperature 97.7 F (36.5 C), temperature source Oral, height 5' 2.5" (1.588 m), weight 146 lb (66.225 kg). ECOG: 1 General appearance: alert Head: Normocephalic, without obvious abnormality, atraumatic Neck: no adenopathy, no carotid bruit, no JVD, supple, symmetrical, trachea midline and thyroid not enlarged, symmetric, no tenderness/mass/nodules Lymph nodes: Cervical, supraclavicular, and axillary nodes normal. Heart:regular rate and rhythm, S1, S2 normal, no murmur, click, rub or gallop Lung:chest clear, no wheezing, rales, normal symmetric air entry, Heart exam - S1, S2 normal, no murmur, no gallop, rate regular Abdomin: soft, non-tender, without masses or organomegaly EXT:no erythema, induration, or nodules   Lab Results: Lab Results  Component Value Date   WBC 6.3 12/16/2011  HGB 15.3 12/16/2011   HCT 45.1 12/16/2011   MCV 112.7* 12/16/2011   PLT 474* 12/16/2011     Chemistry      Component Value Date/Time   NA 139 10/19/2011 0916   K 3.9 10/19/2011 0916   CL 103 10/19/2011 0916   CO2  25 10/19/2011 0916   BUN 11 10/19/2011 0916   CREATININE 0.97 10/19/2011 0916      Component Value Date/Time   CALCIUM 9.4 10/19/2011 0916   ALKPHOS 62 10/19/2011 0916   AST 22 10/19/2011 0916   ALT 16 10/19/2011 0916   BILITOT 0.5 10/19/2011 0916       Impression and Plan:  This is a pleasant 66 year old female with the following issues. 1. Myeloproliferative disorder presenting with essential thrombocythemia.  She is currently on hydroxyurea alternating between 1000 and 1500 mg cell.  Platelet count is under good control at this time.  The plan is to continue on the current dose and regimen and continue to monitor every 2 months. 2. Thrombosis prophylaxis.  Continue to be on aspirin.       [facility], MD 3/21/201310:41 AM

## 2012-01-04 DIAGNOSIS — H43819 Vitreous degeneration, unspecified eye: Secondary | ICD-10-CM | POA: Diagnosis not present

## 2012-02-15 ENCOUNTER — Other Ambulatory Visit (HOSPITAL_BASED_OUTPATIENT_CLINIC_OR_DEPARTMENT_OTHER): Payer: Medicare Other

## 2012-02-15 ENCOUNTER — Telehealth: Payer: Self-pay | Admitting: Oncology

## 2012-02-15 ENCOUNTER — Ambulatory Visit (HOSPITAL_BASED_OUTPATIENT_CLINIC_OR_DEPARTMENT_OTHER): Payer: Medicare Other | Admitting: Oncology

## 2012-02-15 VITALS — BP 117/69 | HR 67 | Temp 98.0°F | Ht 62.5 in | Wt 145.3 lb

## 2012-02-15 DIAGNOSIS — D473 Essential (hemorrhagic) thrombocythemia: Secondary | ICD-10-CM | POA: Diagnosis not present

## 2012-02-15 LAB — CBC WITH DIFFERENTIAL/PLATELET
Basophils Absolute: 0 10*3/uL (ref 0.0–0.1)
Eosinophils Absolute: 0.1 10*3/uL (ref 0.0–0.5)
HGB: 15 g/dL (ref 11.6–15.9)
MONO%: 7.7 % (ref 0.0–14.0)
NEUT#: 3.6 10*3/uL (ref 1.5–6.5)
RBC: 3.85 10*6/uL (ref 3.70–5.45)
RDW: 12.7 % (ref 11.2–14.5)
WBC: 5.9 10*3/uL (ref 3.9–10.3)
lymph#: 1.7 10*3/uL (ref 0.9–3.3)
nRBC: 0 % (ref 0–0)

## 2012-02-15 LAB — COMPREHENSIVE METABOLIC PANEL
Alkaline Phosphatase: 61 U/L (ref 39–117)
BUN: 13 mg/dL (ref 6–23)
Glucose, Bld: 80 mg/dL (ref 70–99)
Total Bilirubin: 0.5 mg/dL (ref 0.3–1.2)

## 2012-02-15 NOTE — Progress Notes (Signed)
Hematology and Oncology Follow Up Visit  PAMLEA FINDER [id number] 1946/04/11 66 y.o. 02/15/2012 10:23 AM  CC: [name], M.D.    Principle Diagnosis:This is a 66 year old female with essential thrombocythemia.  She has JAK2 positive myeloproliferative disorder diagnosed 2007.   Prior Therapy: 1. She is status post anagrelide therapy discontinued due to do intolerance. 2. Status post bone marrow biopsy in August 2008 which showed early signs of myeloproliferative disorder.  Current therapy :She is on hydroxyurea alternating 1000 and 1500 mg every other day, has been treated with hydroxyurea for the last 3 years or so.  Interim History:  Ms. Plourde presents today for a followup visit.  She since the last time I saw her had not really reported any major problems at this time.  Had not reported any nausea.  Did not report any vomiting.  She does report grade 1 fatigue related to her hydroxyurea.  She had not reported any oral ulcers.  Did not report any lesions.  Had not had any major changes in her performance status or activity level.  She has continued to enjoy good quality of life at the time being. No bleeding or thrombotic events noted. No new complications with Hydroxyurea. Very rare episodes of epistaxis continued since her last visit.  No new complications noted.    Medications: I have reviewed the patient's current medications. Current outpatient prescriptions:aspirin 81 MG tablet, Take 81 mg by mouth daily.  , Disp: , Rfl: ;  estrogens, conjugated, (PREMARIN) 0.625 MG tablet, Take 0.625 mg by mouth daily. Take daily for 21 days then do not take for 7 days. , Disp: , Rfl:  hydroxyurea (HYDREA) 500 MG capsule, Take 1 capsule (500 mg total) by mouth as directed. 1 tab during the day 2 tabs before sleep. May take with food to minimize GI side effects., Disp: 120 capsule, Rfl: 3;  LORazepam (ATIVAN) 1 MG tablet, Take 1 mg by mouth every 8 (eight) hours.  , Disp: , Rfl: ;   simvastatin (ZOCOR) 40 MG tablet, Take 40 mg by mouth at bedtime.  , Disp: , Rfl:   Allergies:  Allergies  Allergen Reactions  . Codeine     REACTION: severe nausea/vomiting  . Penicillins     Past Medical History, Surgical history, Social history, and Family History were reviewed and updated.  Review of Systems: Constitutional:  Negative for fever, chills, night sweats, anorexia, weight loss, pain. Cardiovascular: no chest pain or dyspnea on exertion Respiratory: no cough, shortness of breath, or wheezing Neurological: no TIA or stroke symptoms Dermatological: negative ENT: negative Skin: negative. Gastrointestinal: no abdominal pain, change in bowel habits, or black or bloody stools Genito-Urinary: no dysuria, trouble voiding, or hematuria Hematological and Lymphatic: negative Breast: negative for breast lumps negative Musculoskeletal: negative Remaining ROS negative. Physical Exam: Blood pressure 117/69, pulse 67, temperature 98 F (36.7 C), temperature source Oral, height 5' 2.5" (1.588 m), weight 145 lb 4.8 oz (65.908 kg). ECOG: 1 General appearance: alert Head: Normocephalic, without obvious abnormality, atraumatic Neck: no adenopathy, no carotid bruit, no JVD, supple, symmetrical, trachea midline and thyroid not enlarged, symmetric, no tenderness/mass/nodules Lymph nodes: Cervical, supraclavicular, and axillary nodes normal. Heart:regular rate and rhythm, S1, S2 normal, no murmur, click, rub or gallop Lung:chest clear, no wheezing, rales, normal symmetric air entry, Heart exam - S1, S2 normal, no murmur, no gallop, rate regular Abdomin: soft, non-tender, without masses or organomegaly EXT:no erythema, induration, or nodules   Lab Results: Lab Results  Component Value  Date   WBC 5.9 02/15/2012   HGB 15.0 02/15/2012   HCT 43.1 02/15/2012   MCV 111.9* 02/15/2012   PLT 427* 02/15/2012     Chemistry      Component Value Date/Time   NA 140 12/16/2011 0958   K 4.3  12/16/2011 0958   CL 104 12/16/2011 0958   CO2 26 12/16/2011 0958   BUN 16 12/16/2011 0958   CREATININE 0.98 12/16/2011 0958      Component Value Date/Time   CALCIUM 9.5 12/16/2011 0958   ALKPHOS 65 12/16/2011 0958   AST 26 12/16/2011 0958   ALT 19 12/16/2011 0958   BILITOT 0.5 12/16/2011 0958       Impression and Plan:  This is a pleasant 66 year old female with the following issues. 1. Myeloproliferative disorder presenting with essential thrombocythemia.  She is currently on hydroxyurea alternating between 1000 and 1500 mg cell.  Platelet count is under good control at this time.  The plan is to continue on the current dose and regimen and continue to monitor every 2 months. 2. Thrombosis prophylaxis.  Continue to be on aspirin.       [facility], MD 5/21/201310:23 AM

## 2012-02-15 NOTE — Telephone Encounter (Signed)
appts made and pt placed in her cal.     aom

## 2012-04-11 ENCOUNTER — Other Ambulatory Visit (HOSPITAL_BASED_OUTPATIENT_CLINIC_OR_DEPARTMENT_OTHER): Payer: Medicare Other | Admitting: Lab

## 2012-04-11 ENCOUNTER — Ambulatory Visit (HOSPITAL_BASED_OUTPATIENT_CLINIC_OR_DEPARTMENT_OTHER): Payer: Medicare Other | Admitting: Oncology

## 2012-04-11 ENCOUNTER — Telehealth: Payer: Self-pay | Admitting: Oncology

## 2012-04-11 VITALS — BP 113/60 | HR 60 | Temp 98.1°F | Ht 62.5 in | Wt 142.0 lb

## 2012-04-11 DIAGNOSIS — D473 Essential (hemorrhagic) thrombocythemia: Secondary | ICD-10-CM

## 2012-04-11 LAB — COMPREHENSIVE METABOLIC PANEL
Albumin: 4.3 g/dL (ref 3.5–5.2)
Alkaline Phosphatase: 49 U/L (ref 39–117)
BUN: 15 mg/dL (ref 6–23)
Glucose, Bld: 88 mg/dL (ref 70–99)
Potassium: 4 mEq/L (ref 3.5–5.3)

## 2012-04-11 LAB — CBC WITH DIFFERENTIAL/PLATELET
Basophils Absolute: 0 10*3/uL (ref 0.0–0.1)
Eosinophils Absolute: 0.1 10*3/uL (ref 0.0–0.5)
HCT: 42.3 % (ref 34.8–46.6)
HGB: 14.7 g/dL (ref 11.6–15.9)
LYMPH%: 26 % (ref 14.0–49.7)
MCV: 111.4 fL — ABNORMAL HIGH (ref 79.5–101.0)
MONO%: 7.6 % (ref 0.0–14.0)
NEUT#: 3.6 10*3/uL (ref 1.5–6.5)
NEUT%: 64.7 % (ref 38.4–76.8)
Platelets: 384 10*3/uL (ref 145–400)

## 2012-04-11 NOTE — Progress Notes (Signed)
Hematology and Oncology Follow Up Visit  Anna Fuller [id number] 1946/07/22 65 y.o. 04/11/2012 9:21 AM  CC: Anna Fuller, M.D.    Principle Diagnosis:This is a 66 year old female with essential thrombocythemia.  She has JAK2 positive myeloproliferative disorder diagnosed 2007.   Prior Therapy: 1. She is status post anagrelide therapy discontinued due to do intolerance. 2. Status post bone marrow biopsy in August 2008 which showed early signs of myeloproliferative disorder.  Current therapy :She is on hydroxyurea alternating 1000 and 1500 mg every other day, has been treated with hydroxyurea for the last 3 years or so.  Interim History:  Anna Fuller presents today for a followup visit.  She since the last time I saw her had not really reported any major problems at this time.  Had not reported any nausea.  Did not report any vomiting.  She does report grade 1 fatigue related to her hydroxyurea.  She had not reported any oral ulcers.  Did not report any lesions.  Had not had any major changes in her performance status or activity level.  She has continued to enjoy good quality of life at the time being. No bleeding or thrombotic events noted. No new complications with Hydroxyurea.  No new illness or hospitalizations.    Medications: I have reviewed the patient's current medications. Current outpatient prescriptions:aspirin 81 MG tablet, Take 81 mg by mouth daily.  , Disp: , Rfl: ;  estrogens, conjugated, (PREMARIN) 0.625 MG tablet, Take 0.625 mg by mouth daily. Take daily for 21 days then do not take for 7 days. , Disp: , Rfl:  hydroxyurea (HYDREA) 500 MG capsule, Take 1 capsule (500 mg total) by mouth as directed. 1 tab during the day 2 tabs before sleep. May take with food to minimize GI side effects., Disp: 120 capsule, Rfl: 3;  LORazepam (ATIVAN) 1 MG tablet, Take 1 mg by mouth every 8 (eight) hours.  , Disp: , Rfl: ;  simvastatin (ZOCOR) 40 MG tablet, Take 40 mg by mouth at bedtime.   , Disp: , Rfl:   Allergies:  Allergies  Allergen Reactions  . Codeine     REACTION: severe nausea/vomiting  . Penicillins     Past Medical History, Surgical history, Social history, and Family History were reviewed and updated.  Review of Systems: Constitutional:  Negative for fever, chills, night sweats, anorexia, weight loss, pain. Cardiovascular: no chest pain or dyspnea on exertion Respiratory: no cough, shortness of breath, or wheezing Neurological: no TIA or stroke symptoms Dermatological: negative ENT: negative Skin: negative. Gastrointestinal: no abdominal pain, change in bowel habits, or black or bloody stools Genito-Urinary: no dysuria, trouble voiding, or hematuria Hematological and Lymphatic: negative Breast: negative for breast lumps negative Musculoskeletal: negative Remaining ROS negative. Physical Exam: Blood pressure 113/60, pulse 60, temperature 98.1 F (36.7 C), temperature source Oral, height 5' 2.5" (1.588 m), weight 142 lb (64.411 kg). ECOG: 1 General appearance: alert Head: Normocephalic, without obvious abnormality, atraumatic Neck: no adenopathy, no carotid bruit, no JVD, supple, symmetrical, trachea midline and thyroid not enlarged, symmetric, no tenderness/mass/nodules Lymph nodes: Cervical, supraclavicular, and axillary nodes normal. Heart:regular rate and rhythm, S1, S2 normal, no murmur, click, rub or gallop Lung:chest clear, no wheezing, rales, normal symmetric air entry, Heart exam - S1, S2 normal, no murmur, no gallop, rate regular Abdomin: soft, non-tender, without masses or organomegaly EXT:no erythema, induration, or nodules   Lab Results: Lab Results  Component Value Date   WBC 5.6 04/11/2012   HGB 14.7 04/11/2012  HCT 42.3 04/11/2012   MCV 111.4* 04/11/2012   PLT 384 04/11/2012     Chemistry      Component Value Date/Time   NA 139 02/15/2012 0951   K 4.1 02/15/2012 0951   CL 105 02/15/2012 0951   CO2 25 02/15/2012 0951   BUN 13  02/15/2012 0951   CREATININE 0.96 02/15/2012 0951      Component Value Date/Time   CALCIUM 9.5 02/15/2012 0951   ALKPHOS 61 02/15/2012 0951   AST 23 02/15/2012 0951   ALT 18 02/15/2012 0951   BILITOT 0.5 02/15/2012 0951       Impression and Plan:  This is a pleasant 66 year old female with the following issues. 1. Myeloproliferative disorder presenting with essential thrombocythemia.  She is currently on hydroxyurea alternating between 1000 and 1500 mg cell.  Platelet count is under good control at this time.  The plan is to continue on the current dose and regimen and continue to monitor every 2 months. I continued to discuss with her the effect of long term use of hydroxyurea and possible risk of developing leukemia. She wants to continue with the treatment for now.  2. Thrombosis prophylaxis.  Continue to be on aspirin.       [facility], MD 7/16/20139:21 AM

## 2012-04-11 NOTE — Telephone Encounter (Signed)
Gave pt appt date for September 2013 lab and MD

## 2012-06-05 ENCOUNTER — Other Ambulatory Visit: Payer: Self-pay | Admitting: Internal Medicine

## 2012-06-05 DIAGNOSIS — Z1231 Encounter for screening mammogram for malignant neoplasm of breast: Secondary | ICD-10-CM

## 2012-06-14 ENCOUNTER — Ambulatory Visit (HOSPITAL_BASED_OUTPATIENT_CLINIC_OR_DEPARTMENT_OTHER): Payer: Medicare Other | Admitting: Oncology

## 2012-06-14 ENCOUNTER — Telehealth: Payer: Self-pay | Admitting: Oncology

## 2012-06-14 ENCOUNTER — Other Ambulatory Visit (HOSPITAL_BASED_OUTPATIENT_CLINIC_OR_DEPARTMENT_OTHER): Payer: Medicare Other | Admitting: Lab

## 2012-06-14 VITALS — BP 111/55 | HR 62 | Temp 97.5°F | Resp 20 | Ht 62.5 in | Wt 134.2 lb

## 2012-06-14 DIAGNOSIS — D473 Essential (hemorrhagic) thrombocythemia: Secondary | ICD-10-CM

## 2012-06-14 DIAGNOSIS — D47Z9 Other specified neoplasms of uncertain behavior of lymphoid, hematopoietic and related tissue: Secondary | ICD-10-CM | POA: Diagnosis not present

## 2012-06-14 LAB — COMPREHENSIVE METABOLIC PANEL (CC13)
ALT: 15 U/L (ref 0–55)
AST: 22 U/L (ref 5–34)
Chloride: 103 mEq/L (ref 98–107)
Creatinine: 1 mg/dL (ref 0.6–1.1)
Sodium: 139 mEq/L (ref 136–145)
Total Bilirubin: 0.6 mg/dL (ref 0.20–1.20)
Total Protein: 6.7 g/dL (ref 6.4–8.3)

## 2012-06-14 LAB — CBC WITH DIFFERENTIAL/PLATELET
Basophils Absolute: 0.1 10*3/uL (ref 0.0–0.1)
Eosinophils Absolute: 0.1 10*3/uL (ref 0.0–0.5)
HGB: 14.9 g/dL (ref 11.6–15.9)
MCV: 113.6 fL — ABNORMAL HIGH (ref 79.5–101.0)
MONO%: 8.1 % (ref 0.0–14.0)
NEUT#: 3.7 10*3/uL (ref 1.5–6.5)
RDW: 13.7 % (ref 11.2–14.5)
lymph#: 1.6 10*3/uL (ref 0.9–3.3)

## 2012-06-14 NOTE — Telephone Encounter (Signed)
appt made and pr placed in  Calendar      aom

## 2012-06-14 NOTE — Progress Notes (Signed)
Hematology and Oncology Follow Up Visit  Anna Fuller [id number] [birth year] 66 y.o. 06/14/2012 8:55 AM      Principle Diagnosis: This is a 66 year old female with essential thrombocythemia.  She has JAK2 positive myeloproliferative disorder diagnosed 2007.   Prior Therapy: 1. She is status post anagrelide therapy discontinued due to do intolerance. 2. Status post bone marrow biopsy in August 2008 which showed early signs of myeloproliferative disorder.  Current therapy: She is on hydroxyurea alternating 1000 and 1500 mg every other day, has been treated with hydroxyurea for the last 3 years or so.  Interim History:  Anna Fuller presents today for a followup visit.  She since the last time I saw her had not really reported any major problems at this time. She was very involved in the care of her elderly uncle who died recently. She lost some weight because of that. She had not reported any nausea.  Did not report any vomiting.  She does report grade 1 fatigue related to her hydroxyurea.  She had not reported any oral ulcers.  Did not report any lesions.  Had not had any major changes in her performance status or activity level.  She has continued to enjoy good quality of life at the time being. No bleeding or thrombotic events noted. No new complications with Hydroxyurea.  No new illness or hospitalizations since her last visit.    Medications: I have reviewed the patient's current medications. Current outpatient prescriptions:aspirin 81 MG tablet, Take 81 mg by mouth daily.  , Disp: , Rfl: ;  estrogens, conjugated, (PREMARIN) 0.625 MG tablet, Take 0.625 mg by mouth daily. Take daily for 21 days then do not take for 7 days. , Disp: , Rfl:  hydroxyurea (HYDREA) 500 MG capsule, Take 1 capsule (500 mg total) by mouth as directed. 1 tab during the day 2 tabs before sleep. May take with food to minimize GI side effects., Disp: 120 capsule, Rfl: 3;  LORazepam (ATIVAN) 1 MG tablet, Take 1 mg by  mouth every 8 (eight) hours.  , Disp: , Rfl: ;  simvastatin (ZOCOR) 40 MG tablet, Take 40 mg by mouth at bedtime.  , Disp: , Rfl:   Allergies:  Allergies  Allergen Reactions  . Codeine     REACTION: severe nausea/vomiting  . Penicillins     Past Medical History, Surgical history, Social history, and Family History were reviewed and updated.  Review of Systems: Constitutional:  Negative for fever, chills, night sweats, anorexia, weight loss, pain. Cardiovascular: no chest pain or dyspnea on exertion Respiratory: no cough, shortness of breath, or wheezing Neurological: no TIA or stroke symptoms Dermatological: negative ENT: negative Skin: negative. Gastrointestinal: no abdominal pain, change in bowel habits, or black or bloody stools Genito-Urinary: no dysuria, trouble voiding, or hematuria Hematological and Lymphatic: negative Breast: negative for breast lumps negative Musculoskeletal: negative Remaining ROS negative. Physical Exam: Blood pressure 111/55, pulse 62, temperature 97.5 F (36.4 C), temperature source Oral, resp. rate 20, height 5' 2.5" (1.588 m), weight 134 lb 3.2 oz (60.873 kg). ECOG: 1 General appearance: alert Head: Normocephalic, without obvious abnormality, atraumatic Neck: no adenopathy, no carotid bruit, no JVD, supple, symmetrical, trachea midline and thyroid not enlarged, symmetric, no tenderness/mass/nodules Lymph nodes: Cervical, supraclavicular, and axillary nodes normal. Heart:regular rate and rhythm, S1, S2 normal, no murmur, click, rub or gallop Lung:chest clear, no wheezing, rales, normal symmetric air entry, Heart exam - S1, S2 normal, no murmur, no gallop, rate regular Abdomin: soft, non-tender, without masses  or organomegaly EXT:no erythema, induration, or nodules   Lab Results: Lab Results  Component Value Date   WBC 5.8 06/14/2012   HGB 14.9 06/14/2012   HCT 43.3 06/14/2012   MCV 113.6* 06/14/2012   PLT 367 06/14/2012     Chemistry        Component Value Date/Time   NA 138 04/11/2012 0839   K 4.0 04/11/2012 0839   CL 104 04/11/2012 0839   CO2 27 04/11/2012 0839   BUN 15 04/11/2012 0839   CREATININE 0.94 04/11/2012 0839      Component Value Date/Time   CALCIUM 8.9 04/11/2012 0839   ALKPHOS 49 04/11/2012 0839   AST 25 04/11/2012 0839   ALT 18 04/11/2012 0839   BILITOT 0.5 04/11/2012 0839      Impression and Plan:  This is a pleasant 66 year old female with the following issues. 1. Myeloproliferative disorder presenting with essential thrombocythemia.  She is currently on hydroxyurea alternating between 1000 and 1500 mg cell.  Platelet count is under good control at this time (367).  The plan is to continue on the current dose and regimen and continue to monitor every 2 months. I continued to discuss with her the effect of long term use of hydroxyurea. She wants to continue with the treatment for now.  2. Thrombosis prophylaxis.  Continue to be on aspirin.       [facility], MD 9/18/20138:55 AM

## 2012-06-16 DIAGNOSIS — Z23 Encounter for immunization: Secondary | ICD-10-CM | POA: Diagnosis not present

## 2012-07-10 ENCOUNTER — Ambulatory Visit
Admission: RE | Admit: 2012-07-10 | Discharge: 2012-07-10 | Disposition: A | Discharge status: Home / Self Care | Payer: Medicare Other | Source: Ambulatory Visit | Attending: Internal Medicine | Admitting: Internal Medicine

## 2012-07-10 DIAGNOSIS — Z1231 Encounter for screening mammogram for malignant neoplasm of breast: Secondary | ICD-10-CM | POA: Diagnosis not present

## 2012-08-09 ENCOUNTER — Other Ambulatory Visit (HOSPITAL_BASED_OUTPATIENT_CLINIC_OR_DEPARTMENT_OTHER): Payer: Medicare Other | Admitting: Lab

## 2012-08-09 ENCOUNTER — Telehealth: Payer: Self-pay | Admitting: Oncology

## 2012-08-09 ENCOUNTER — Ambulatory Visit (HOSPITAL_BASED_OUTPATIENT_CLINIC_OR_DEPARTMENT_OTHER): Payer: Medicare Other | Admitting: Oncology

## 2012-08-09 DIAGNOSIS — D473 Essential (hemorrhagic) thrombocythemia: Secondary | ICD-10-CM | POA: Diagnosis not present

## 2012-08-09 DIAGNOSIS — D47Z9 Other specified neoplasms of uncertain behavior of lymphoid, hematopoietic and related tissue: Secondary | ICD-10-CM | POA: Diagnosis not present

## 2012-08-09 LAB — CBC WITH DIFFERENTIAL/PLATELET
BASO%: 0.5 % (ref 0.0–2.0)
EOS%: 0.8 % (ref 0.0–7.0)
HCT: 44 % (ref 34.8–46.6)
LYMPH%: 27.6 % (ref 14.0–49.7)
MCH: 40.7 pg — ABNORMAL HIGH (ref 25.1–34.0)
MCHC: 35.5 g/dL (ref 31.5–36.0)
MONO%: 7.3 % (ref 0.0–14.0)
NEUT%: 63.8 % (ref 38.4–76.8)
Platelets: 371 10*3/uL (ref 145–400)

## 2012-08-09 LAB — COMPREHENSIVE METABOLIC PANEL (CC13)
ALT: 14 U/L (ref 0–55)
BUN: 13 mg/dL (ref 7.0–26.0)
CO2: 24 mEq/L (ref 22–29)
Calcium: 9.5 mg/dL (ref 8.4–10.4)
Chloride: 104 mEq/L (ref 98–107)
Creatinine: 0.9 mg/dL (ref 0.6–1.1)
Glucose: 84 mg/dl (ref 70–99)

## 2012-08-09 NOTE — Progress Notes (Signed)
Hematology and Oncology Follow Up Visit  Anna Fuller [id number] 12-Sep-1946 66 y.o. 08/09/2012 10:16 AM      Principle Diagnosis: This is a 66 year old female with essential thrombocythemia.  She has JAK2 positive myeloproliferative disorder diagnosed 2007.  Prior Therapy: 1. She is status post anagrelide therapy discontinued due to do intolerance. 2. Status post bone marrow biopsy in August 2008 which showed early signs of myeloproliferative disorder.  Current therapy: She is on hydroxyurea alternating 1000 and 1500 mg every other day, has been treated with hydroxyurea for the last 3 years or so.  Interim History:  Anna Fuller presents today for a followup visit.  She since the last time I saw her had not really reported any major problems at this time. She was very involved in the care of her elderly uncle who died recently. She lost some weight because of that. She had not reported any nausea.  Did not report any vomiting.  She does report grade 1 fatigue related to her hydroxyurea.  She had not reported any oral ulcers.  Did not report any lesions.  Had not had any major changes in her performance status or activity level.  She has continued to enjoy good quality of life at the time being.No bleeding or thrombotic events noted. No new complications with Hydroxyurea.  No new illness or hospitalizations since her last visit.    Medications: I have reviewed the patient's current medications. Current outpatient prescriptions:aspirin 81 MG tablet, Take 81 mg by mouth daily.  , Disp: , Rfl: ;  estrogens, conjugated, (PREMARIN) 0.625 MG tablet, Take 0.625 mg by mouth daily. Take daily for 21 days then do not take for 7 days. , Disp: , Rfl:  hydroxyurea (HYDREA) 500 MG capsule, Take 1 capsule (500 mg total) by mouth as directed. 1 tab during the day 2 tabs before sleep. May take with food to minimize GI side effects., Disp: 120 capsule, Rfl: 3;  simvastatin (ZOCOR) 40 MG tablet, Take 40 mg by  mouth at bedtime.  , Disp: , Rfl:   Allergies:  Allergies  Allergen Reactions  . Codeine     REACTION: severe nausea/vomiting  . Penicillins     Past Medical History, Surgical history, Social history, and Family History were reviewed and updated.  Review of Systems: Constitutional:  Negative for fever, chills, night sweats, anorexia, weight loss, pain. Cardiovascular: no chest pain or dyspnea on exertion Respiratory: no cough, shortness of breath, or wheezing Neurological: no TIA or stroke symptoms Dermatological: negative ENT: negative Skin: negative. Gastrointestinal: no abdominal pain, change in bowel habits, or black or bloody stools Genito-Urinary: no dysuria, trouble voiding, or hematuria Hematological and Lymphatic: negative Breast: negative for breast lumps negative Musculoskeletal: negative Remaining ROS negative. Physical Exam: There were no vitals taken for this visit. ECOG: 1 General appearance: alert Head: Normocephalic, without obvious abnormality, atraumatic Neck: no adenopathy, no carotid bruit, no JVD, supple, symmetrical, trachea midline and thyroid not enlarged, symmetric, no tenderness/mass/nodules Lymph nodes: Cervical, supraclavicular, and axillary nodes normal. Heart:regular rate and rhythm, S1, S2 normal, no murmur, click, rub or gallop Lung:chest clear, no wheezing, rales, normal symmetric air entry, Heart exam - S1, S2 normal, no murmur, no gallop, rate regular Abdomin: soft, non-tender, without masses or organomegaly EXT:no erythema, induration, or nodules   Lab Results: Lab Results  Component Value Date   WBC 5.5 08/09/2012   HGB 15.6 08/09/2012   HCT 44.0 08/09/2012   MCV 114.7* 08/09/2012   PLT 371 08/09/2012  Chemistry      Component Value Date/Time   NA 139 06/14/2012 0808   NA 138 04/11/2012 0839   K 3.8 06/14/2012 0808   K 4.0 04/11/2012 0839   CL 103 06/14/2012 0808   CL 104 04/11/2012 0839   CO2 25 06/14/2012 0808   CO2 27  04/11/2012 0839   BUN 14.0 06/14/2012 0808   BUN 15 04/11/2012 0839   CREATININE 1.0 06/14/2012 0808   CREATININE 0.94 04/11/2012 0839      Component Value Date/Time   CALCIUM 9.5 06/14/2012 0808   CALCIUM 8.9 04/11/2012 0839   ALKPHOS 63 06/14/2012 0808   ALKPHOS 49 04/11/2012 0839   AST 22 06/14/2012 0808   AST 25 04/11/2012 0839   ALT 15 06/14/2012 0808   ALT 18 04/11/2012 0839   BILITOT 0.60 06/14/2012 0808   BILITOT 0.5 04/11/2012 0839      Impression and Plan:  This is a pleasant 66 year old female with the following issues. 1. Myeloproliferative disorder presenting with essential thrombocythemia.  She is currently on hydroxyurea alternating between 1000 and 1500 mg cell.  Platelet count is under good control at this time (371).  The plan is to continue on the current dose and regimen and continue to monitor every 2 months. I continued to discuss with her the effect of long term use of hydroxyurea. She wants to continue with the treatment for now.  2. Thrombosis prophylaxis.  Continue to be on aspirin.       Geraldine Sandberg, MD 11/13/201310:16 AM

## 2012-08-09 NOTE — Telephone Encounter (Signed)
Gave pt appt calendar for January 2014 lab and MD

## 2012-08-17 ENCOUNTER — Other Ambulatory Visit: Payer: Medicare Other | Admitting: Internal Medicine

## 2012-08-17 DIAGNOSIS — M899 Disorder of bone, unspecified: Secondary | ICD-10-CM | POA: Diagnosis not present

## 2012-08-17 DIAGNOSIS — Z79899 Other long term (current) drug therapy: Secondary | ICD-10-CM | POA: Diagnosis not present

## 2012-08-17 DIAGNOSIS — M8580 Other specified disorders of bone density and structure, unspecified site: Secondary | ICD-10-CM

## 2012-08-17 DIAGNOSIS — E785 Hyperlipidemia, unspecified: Secondary | ICD-10-CM | POA: Diagnosis not present

## 2012-08-17 DIAGNOSIS — R5383 Other fatigue: Secondary | ICD-10-CM

## 2012-08-17 DIAGNOSIS — R5381 Other malaise: Secondary | ICD-10-CM | POA: Diagnosis not present

## 2012-08-17 DIAGNOSIS — Z0000 Encounter for general adult medical examination without abnormal findings: Secondary | ICD-10-CM

## 2012-08-17 LAB — CBC WITH DIFFERENTIAL/PLATELET
Basophils Absolute: 0 10*3/uL (ref 0.0–0.1)
HCT: 41.9 % (ref 36.0–46.0)
Hemoglobin: 14.3 g/dL (ref 12.0–15.0)
Lymphocytes Relative: 32 % (ref 12–46)
Monocytes Absolute: 0.4 10*3/uL (ref 0.1–1.0)
Neutro Abs: 2.9 10*3/uL (ref 1.7–7.7)
RDW: 14.2 % (ref 11.5–15.5)
WBC: 5 10*3/uL (ref 4.0–10.5)

## 2012-08-17 LAB — COMPREHENSIVE METABOLIC PANEL
ALT: 11 U/L (ref 0–35)
AST: 17 U/L (ref 0–37)
Albumin: 4.1 g/dL (ref 3.5–5.2)
BUN: 14 mg/dL (ref 6–23)
Calcium: 9.2 mg/dL (ref 8.4–10.5)
Chloride: 107 mEq/L (ref 96–112)
Potassium: 4.1 mEq/L (ref 3.5–5.3)

## 2012-08-17 LAB — LIPID PANEL: HDL: 64 mg/dL (ref >39)

## 2012-08-18 ENCOUNTER — Ambulatory Visit (INDEPENDENT_AMBULATORY_CARE_PROVIDER_SITE_OTHER): Payer: Medicare Other | Admitting: Internal Medicine

## 2012-08-18 ENCOUNTER — Encounter: Payer: Self-pay | Admitting: Internal Medicine

## 2012-08-18 VITALS — BP 126/70 | HR 76 | Temp 97.6°F | Ht 62.75 in | Wt 133.5 lb

## 2012-08-18 DIAGNOSIS — Z23 Encounter for immunization: Secondary | ICD-10-CM

## 2012-08-18 DIAGNOSIS — Z1589 Genetic susceptibility to other disease: Secondary | ICD-10-CM

## 2012-08-18 DIAGNOSIS — Z0000 Encounter for general adult medical examination without abnormal findings: Secondary | ICD-10-CM

## 2012-08-18 DIAGNOSIS — Z79890 Hormone replacement therapy (postmenopausal): Secondary | ICD-10-CM

## 2012-08-18 DIAGNOSIS — R69 Illness, unspecified: Secondary | ICD-10-CM

## 2012-08-18 DIAGNOSIS — Z5181 Encounter for therapeutic drug level monitoring: Secondary | ICD-10-CM | POA: Diagnosis not present

## 2012-08-18 DIAGNOSIS — E785 Hyperlipidemia, unspecified: Secondary | ICD-10-CM | POA: Diagnosis not present

## 2012-08-18 DIAGNOSIS — D126 Benign neoplasm of colon, unspecified: Secondary | ICD-10-CM | POA: Insufficient documentation

## 2012-08-18 LAB — POCT URINALYSIS DIPSTICK
Glucose, UA: NEGATIVE
Nitrite, UA: NEGATIVE
Urobilinogen, UA: NEGATIVE

## 2012-08-18 LAB — TSH: TSH: 1.894 u[IU]/mL (ref 0.350–4.500)

## 2012-08-18 LAB — VITAMIN D 25 HYDROXY (VIT D DEFICIENCY, FRACTURES): Vit D, 25-Hydroxy: 25 ng/mL — ABNORMAL LOW (ref 30–89)

## 2012-08-18 NOTE — Patient Instructions (Addendum)
Take Vitamin D 2000 units daily for Vitamin D deficiency.  Continue same meds otherwise

## 2012-08-18 NOTE — Progress Notes (Signed)
Subjective:    Patient ID: Anna Fuller, female    DOB: [birth year], 66 y.o.   MRN: [id number]  HPI  First visit for this 66 year old White female with essential thrombocytosis. Followed by Dr. Clelia Croft  and treated with Hydrea.  Bone marrow in 2008 showed early signs of myeloproliferative disorder. She has JAK2 positive myeloproliferative disorder diagnosed in 2007.Marland Kitchen Anagrilide was tried initially but patient did not tolerate it. Oncologist tries to keep platelet counts between 400,006 100,000.  Hospitalized with fever of unknown origin in 32. Cholecystectomy 1983. Hysterectomy with bilateral oophorectomy 1981. Right wrist surgery 2008. Foot surgery both feet 1965, 1968, 1969. His had left knee and right knee surgery.  History of hyperlipidemia treated with simvastatin. Is on Premarin daily. Does not want to be off Premarin.  Allergic to contrast dye, cortisone, penicillin  Last colonoscopy 03/12/10 by Dr.Kaplan. History of adenomatous polyps      Patient is single and retired. Completed 2 years of college  Family history:  Sister is Kassiah Zeitz. No other siblings.  Father died at age 62 with complications of diabetes. Mother died at age 1 of pulmonary fibrosis.  Social history:  Patient does not smoke or consume alcohol.  Patient enjoys yard work and doing some walking. She sees oncologist every 6-8 weeks for blood work.  Additional history: [facility]  Had benign tumor removed from had a Dr. Erlinda Hong. Takes baby aspirin daily.  Had Pneumovax immunization 08/17/2010  Tetanus immunization 06/30/2005  Zostavax 08/17/10         Review of Systems  Constitutional: Negative.   HENT: Negative.   Eyes: Negative.   Respiratory: Negative.   Cardiovascular: Negative.   Gastrointestinal: Negative.   Genitourinary: Negative.   Musculoskeletal: Negative.   Neurological: Negative.   Hematological: Negative.   Psychiatric/Behavioral:  Negative.        Objective:   Physical Exam  Vitals reviewed. Constitutional: She is oriented to person, place, and time. She appears well-developed and well-nourished. No distress.  HENT:  Head: Normocephalic and atraumatic.  Right Ear: External ear normal.  Left Ear: External ear normal.  Mouth/Throat: Oropharynx is clear and moist. No oropharyngeal exudate.  Eyes: Conjunctivae normal and EOM are normal. Pupils are equal, round, and reactive to light. Right eye exhibits no discharge. Left eye exhibits no discharge. No scleral icterus.  Neck: Normal range of motion. Neck supple. No JVD present. No thyromegaly present.  Cardiovascular: Normal rate, regular rhythm, normal heart sounds and intact distal pulses.   No murmur heard. Pulmonary/Chest: Effort normal and breath sounds normal. No respiratory distress. She has no wheezes. She has no rales. She exhibits no tenderness.       Breasts normal female  Abdominal: Soft. Bowel sounds are normal. She exhibits no distension and no mass. There is no tenderness. There is no rebound and no guarding.  Genitourinary:       Deferred status post choledochal hysterectomy BSO 1981  Musculoskeletal: Normal range of motion. She exhibits no edema and no tenderness.  Lymphadenopathy:    She has no cervical adenopathy.  Neurological: She is alert and oriented to person, place, and time. She has normal reflexes. She displays normal reflexes. No cranial nerve deficit. Coordination normal.  Skin: Skin is warm and dry. No rash noted. She is not diaphoretic. No erythema. No pallor.  Psychiatric: She has a normal mood and affect. Her behavior is normal. Judgment and thought content normal.  Assessment & Plan:  Essential thrombocytosis with early myeloproliferative disorder JAK2  Vitamin D deficiency  Hyperlipidemia  History of adenomatous colon polyps  Plan: Return in 6 months for office visit, lipid panel, liver  functions.     Subjective:   Patient presents for Medicare Annual/Subsequent preventive examination.   Review Past Medical/Family/Social: See Epic   Risk Factors  Current exercise habits: yard work Dietary issues discussed: low fat low carb  Cardiac risk factors: Family history of heart disease  Depression Screen  (Note: if answer to either of the following is "Yes", a more complete depression screening is indicated)   Over the past two weeks, have you felt down, depressed or hopeless? No  Over the past two weeks, have you felt little interest or pleasure in doing things? No Have you lost interest or pleasure in daily life? No Do you often feel hopeless? No Do you cry easily over simple problems? No   Activities of Daily Living  In your present state of health, do you have any difficulty performing the following activities?:   Driving? No  Managing money? No  Feeding yourself? No  Getting from bed to chair? No  Climbing a flight of stairs? No  Preparing food and eating?: No  Bathing or showering? No  Getting dressed: No  Getting to the toilet? No  Using the toilet:No  Moving around from place to place: No  In the past year have you fallen or had a near fall?: yes- did not hurt self Are you sexually active? No  Do you have more than one partner? No   Hearing Difficulties: No  Do you often ask people to speak up or repeat themselves? No  Do you experience ringing or noises in your ears? No  Do you have difficulty understanding soft or whispered voices? No  Do you feel that you have a problem with memory? No Do you often misplace items? No    Home Safety:  Do you have a smoke alarm at your residence? Yes Do you have grab bars in the bathroom?yes Do you have throw rugs in your house? yes   Cognitive Testing  Alert? Yes Normal Appearance?Yes  Oriented to person? Yes Place? Yes  Time? Yes  Recall of three objects? Yes  Can perform simple calculations? Yes   Displays appropriate judgment?Yes  Can read the correct time from a watch face?Yes   List the Names of Other Physician/Practitioners you currently use:  See referral list for the physicians patient is currently seeing. Dr. Clelia Croft. oncology    Review of Systems:   Objective:     General appearance: Appears stated age and mildly obese  Head: Normocephalic, without obvious abnormality, atraumatic  Eyes: conj clear, EOMi PEERLA  Ears: normal TM's and external ear canals both ears  Nose: Nares normal. Septum midline. Mucosa normal. No drainage or sinus tenderness.  Throat: lips, mucosa, and tongue normal; teeth and gums normal  Neck: no adenopathy, no carotid bruit, no JVD, supple, symmetrical, trachea midline and thyroid not enlarged, symmetric, no tenderness/mass/nodules  No CVA tenderness.  Lungs: clear to auscultation bilaterally  Breasts: normal appearance, no masses or tenderness,  Heart: regular rate and rhythm, S1, S2 normal, no murmur, click, rub or gallop  Abdomen: soft, non-tender; bowel sounds normal; no masses, no organomegaly  Musculoskeletal: ROM normal in all joints, no crepitus, no deformity, Normal muscle strengthen. Back  is symmetric, no curvature. Skin: Skin color, texture, turgor normal. No rashes or lesions  Lymph  nodes: Cervical, supraclavicular, and axillary nodes normal.  Neurologic: CN 2 -12 Normal, Normal symmetric reflexes. Normal coordination and gait  Psych: Alert & Oriented x 3, Mood appear stable.    Assessment:    Annual wellness medicare exam   Plan:    During the course of the visit the patient was educated and counseled about appropriate screening and preventive services including:        Patient Instructions (the written plan) was given to the patient.  Medicare Attestation  I have personally reviewed:  The patient's medical and social history  Their use of alcohol, tobacco or illicit drugs  Their current medications and  supplements  The patient's functional ability including ADLs,fall risks, home safety risks, cognitive, and hearing and visual impairment  Diet and physical activities  Evidence for depression or mood disorders  The patient's weight, height, BMI, and visual acuity have been recorded in the chart. I have made referrals, counseling, and provided education to the patient based on review of the above and I have provided the patient with a written personalized care plan for preventive services.

## 2012-08-19 ENCOUNTER — Encounter: Payer: Self-pay | Admitting: Internal Medicine

## 2012-08-19 DIAGNOSIS — Z5181 Encounter for therapeutic drug level monitoring: Secondary | ICD-10-CM | POA: Insufficient documentation

## 2012-08-19 DIAGNOSIS — Z79890 Hormone replacement therapy (postmenopausal): Secondary | ICD-10-CM | POA: Insufficient documentation

## 2012-08-19 DIAGNOSIS — E785 Hyperlipidemia, unspecified: Secondary | ICD-10-CM | POA: Insufficient documentation

## 2012-08-19 MED ORDER — SIMVASTATIN 40 MG PO TABS: 40.0000 mg | ORAL_TABLET | Freq: Every day | ORAL | Status: DC

## 2012-08-19 MED ORDER — ESTROGENS CONJUGATED 0.3 MG PO TABS: 0.3000 mg | ORAL_TABLET | Freq: Every day | ORAL | Status: DC

## 2012-10-11 ENCOUNTER — Ambulatory Visit (HOSPITAL_BASED_OUTPATIENT_CLINIC_OR_DEPARTMENT_OTHER): Payer: Medicare Other | Admitting: Oncology

## 2012-10-11 ENCOUNTER — Other Ambulatory Visit (HOSPITAL_BASED_OUTPATIENT_CLINIC_OR_DEPARTMENT_OTHER): Payer: Medicare Other

## 2012-10-11 ENCOUNTER — Telehealth: Payer: Self-pay | Admitting: Oncology

## 2012-10-11 VITALS — BP 130/47 | HR 65 | Temp 98.0°F | Resp 20 | Ht 63.0 in | Wt 133.8 lb

## 2012-10-11 DIAGNOSIS — D47Z9 Other specified neoplasms of uncertain behavior of lymphoid, hematopoietic and related tissue: Secondary | ICD-10-CM | POA: Diagnosis not present

## 2012-10-11 DIAGNOSIS — D473 Essential (hemorrhagic) thrombocythemia: Secondary | ICD-10-CM | POA: Diagnosis not present

## 2012-10-11 LAB — CBC WITH DIFFERENTIAL/PLATELET
Basophils Absolute: 0 10*3/uL (ref 0.0–0.1)
EOS%: 0.9 % (ref 0.0–7.0)
HGB: 14.5 g/dL (ref 11.6–15.9)
LYMPH%: 35.2 % (ref 14.0–49.7)
MCH: 40.3 pg — ABNORMAL HIGH (ref 25.1–34.0)
MCV: 114.4 fL — ABNORMAL HIGH (ref 79.5–101.0)
MONO%: 8 % (ref 0.0–14.0)
Platelets: 322 10*3/uL (ref 145–400)
RDW: 13.1 % (ref 11.2–14.5)

## 2012-10-11 LAB — COMPREHENSIVE METABOLIC PANEL (CC13)
AST: 18 U/L (ref 5–34)
Albumin: 3.8 g/dL (ref 3.5–5.0)
Alkaline Phosphatase: 61 U/L (ref 40–150)
BUN: 13 mg/dL (ref 7.0–26.0)
Creatinine: 0.9 mg/dL (ref 0.6–1.1)
Potassium: 3.4 mEq/L — ABNORMAL LOW (ref 3.5–5.1)
Total Bilirubin: 0.53 mg/dL (ref 0.20–1.20)

## 2012-10-11 NOTE — Telephone Encounter (Signed)
gv pt appt for March 13th...the patient aware and wrote in personal calander

## 2012-10-11 NOTE — Progress Notes (Signed)
Hematology and Oncology Follow Up Visit  Anna Fuller [id number] September 18, 1946 67 y.o. 10/11/2012 9:05 AM      Principle Diagnosis: This is a 67 year old female with essential thrombocythemia.  She has JAK2 positive myeloproliferative disorder diagnosed 2007.  Prior Therapy: 1. She is status post anagrelide therapy discontinued due to do intolerance. 2. Status post bone marrow biopsy in August 2008 which showed early signs of myeloproliferative disorder.  Current therapy: She is on hydroxyurea alternating 1000 and 1500 mg every other day, has been treated with hydroxyurea for the last 4 years or so.  Interim History:  Anna Fuller presents today for a followup visit.  She since the last time I saw her had not really reported any major problems at this time. She had not reported any nausea.  Did not report any vomiting.  She does report grade 1 fatigue related to her hydroxyurea.  She had not reported any oral ulcers.  Did not report any lesions.  Had not had any major changes in her performance status or activity level.  She has continued to enjoy good quality of life at the time being.No bleeding or thrombotic events noted. No new complications with Hydroxyurea. No new illness or hospitalizations since her last visit.    Medications: I have reviewed the patient's current medications. Current outpatient prescriptions:aspirin 81 MG tablet, Take 81 mg by mouth daily.  , Disp: , Rfl: ;  cholecalciferol (VITAMIN D) 1000 UNITS tablet, Take 2,000 Units by mouth daily., Disp: , Rfl: ;  estrogens, conjugated, (PREMARIN) 0.3 MG tablet, Take 1 tablet (0.3 mg total) by mouth daily. Take daily for 21 days then do not take for 7 days., Disp: 30 tablet, Rfl: 11 hydroxyurea (HYDREA) 500 MG capsule, Take 1 capsule (500 mg total) by mouth as directed. 1 tab during the day 2 tabs before sleep. May take with food to minimize GI side effects., Disp: 120 capsule, Rfl: 3;  simvastatin (ZOCOR) 40 MG tablet, Take 1  tablet (40 mg total) by mouth at bedtime., Disp: 30 tablet, Rfl: 11  Allergies:  Allergies  Allergen Reactions  . Iodine Anaphylaxis    Contrast dye  . Codeine     REACTION: severe nausea/vomiting  . Penicillins     Past Medical History, Surgical history, Social history, and Family History were reviewed and updated.  Review of Systems: Constitutional:  Negative for fever, chills, night sweats, anorexia, weight loss, pain. Cardiovascular: no chest pain or dyspnea on exertion Respiratory: no cough, shortness of breath, or wheezing Neurological: no TIA or stroke symptoms Dermatological: negative ENT: negative Skin: negative. Gastrointestinal: no abdominal pain, change in bowel habits, or black or bloody stools Genito-Urinary: no dysuria, trouble voiding, or hematuria Hematological and Lymphatic: negative Breast: negative for breast lumps negative Musculoskeletal: negative Remaining ROS negative. Physical Exam: Blood pressure 130/47, pulse 65, temperature 98 F (36.7 C), temperature source Oral, resp. rate 20, height 5\' 3"  (1.6 m), weight 133 lb 12.8 oz (60.691 kg). ECOG: 1 General appearance: alert Head: Normocephalic, without obvious abnormality, atraumatic Neck: no adenopathy, no carotid bruit, no JVD, supple, symmetrical, trachea midline and thyroid not enlarged, symmetric, no tenderness/mass/nodules Lymph nodes: Cervical, supraclavicular, and axillary nodes normal. Heart:regular rate and rhythm, S1, S2 normal, no murmur, click, rub or gallop Lung:chest clear, no wheezing, rales, normal symmetric air entry, Heart exam - S1, S2 normal, no murmur, no gallop, rate regular Abdomin: soft, non-tender, without masses or organomegaly EXT:no erythema, induration, or nodules   Lab Results: Lab Results  Component Value Date   WBC 4.8 10/11/2012   HGB 14.5 10/11/2012   HCT 41.3 10/11/2012   MCV 114.4* 10/11/2012   PLT 322 10/11/2012     Chemistry      Component Value Date/Time    NA 143 08/17/2012 0915   NA 138 08/09/2012 0941   K 4.1 08/17/2012 0915   K 3.8 08/09/2012 0941   CL 107 08/17/2012 0915   CL 104 08/09/2012 0941   CO2 29 08/17/2012 0915   CO2 24 08/09/2012 0941   BUN 14 08/17/2012 0915   BUN 13.0 08/09/2012 0941   CREATININE 0.79 08/17/2012 0915   CREATININE 0.9 08/09/2012 0941   CREATININE 0.94 04/11/2012 0839      Component Value Date/Time   CALCIUM 9.2 08/17/2012 0915   CALCIUM 9.5 08/09/2012 0941   ALKPHOS 53 08/17/2012 0915   ALKPHOS 76 08/09/2012 0941   AST 17 08/17/2012 0915   AST 19 08/09/2012 0941   ALT 11 08/17/2012 0915   ALT 14 08/09/2012 0941   BILITOT 0.4 08/17/2012 0915   BILITOT 0.52 08/09/2012 0941      Impression and Plan:  This is a pleasant 68 year old female with the following issues. 1. Myeloproliferative disorder presenting with essential thrombocythemia.  She is currently on hydroxyurea alternating between 1000 and 1500 mg cell.  Platelet count is under good control at this time (322).  The plan is to continue on the current dose and regimen and continue to monitor every 2 months. I continued to discuss with her the effect of long term use of hydroxyurea. She wants to continue with the treatment for now.  2. Thrombosis prophylaxis.  Continue to be on aspirin.       [facility], MD 1/15/20149:05 AM

## 2012-10-25 ENCOUNTER — Other Ambulatory Visit: Payer: Self-pay | Admitting: Oncology

## 2012-12-07 ENCOUNTER — Ambulatory Visit (HOSPITAL_BASED_OUTPATIENT_CLINIC_OR_DEPARTMENT_OTHER): Payer: Medicare Other | Admitting: Oncology

## 2012-12-07 ENCOUNTER — Other Ambulatory Visit (HOSPITAL_BASED_OUTPATIENT_CLINIC_OR_DEPARTMENT_OTHER): Payer: Medicare Other | Admitting: Lab

## 2012-12-07 ENCOUNTER — Telehealth: Payer: Self-pay | Admitting: Oncology

## 2012-12-07 VITALS — BP 109/59 | HR 69 | Temp 97.9°F | Resp 18 | Ht 63.0 in | Wt 134.3 lb

## 2012-12-07 DIAGNOSIS — D473 Essential (hemorrhagic) thrombocythemia: Secondary | ICD-10-CM

## 2012-12-07 DIAGNOSIS — D47Z9 Other specified neoplasms of uncertain behavior of lymphoid, hematopoietic and related tissue: Secondary | ICD-10-CM | POA: Diagnosis not present

## 2012-12-07 LAB — CBC WITH DIFFERENTIAL/PLATELET
Basophils Absolute: 0 10*3/uL (ref 0.0–0.1)
EOS%: 0.7 % (ref 0.0–7.0)
Eosinophils Absolute: 0 10*3/uL (ref 0.0–0.5)
HCT: 41.6 % (ref 34.8–46.6)
HGB: 14.6 g/dL (ref 11.6–15.9)
LYMPH%: 28.5 % (ref 14.0–49.7)
MCH: 40.6 pg — ABNORMAL HIGH (ref 25.1–34.0)
MCV: 115.3 fL — ABNORMAL HIGH (ref 79.5–101.0)
MONO%: 8.8 % (ref 0.0–14.0)
NEUT#: 3.2 10*3/uL (ref 1.5–6.5)
NEUT%: 61.3 % (ref 38.4–76.8)
Platelets: 362 10*3/uL (ref 145–400)

## 2012-12-07 LAB — COMPREHENSIVE METABOLIC PANEL (CC13)
AST: 22 U/L (ref 5–34)
Albumin: 4.1 g/dL (ref 3.5–5.0)
Alkaline Phosphatase: 71 U/L (ref 40–150)
BUN: 9.6 mg/dL (ref 7.0–26.0)
Creatinine: 1 mg/dL (ref 0.6–1.1)
Glucose: 91 mg/dl (ref 70–99)

## 2012-12-07 NOTE — Telephone Encounter (Signed)
gv pt appt for May...pt did not want printed schedule

## 2012-12-07 NOTE — Progress Notes (Signed)
Hematology and Oncology Follow Up Visit  Anna Fuller [id number] 1945-11-23 66 y.o. 12/07/2012 9:24 AM      Principle Diagnosis: This is a 67 year old female with essential thrombocythemia.  She has JAK2 positive myeloproliferative disorder diagnosed 2007.  Prior Therapy: 1. She is status post anagrelide therapy discontinued due to do intolerance. 2. Status post bone marrow biopsy in August 2008 which showed early signs of myeloproliferative disorder.  Current therapy: She is on hydroxyurea alternating 1000 and 1500 mg every other day, has been treated with hydroxyurea for the last 4 years or so.  Interim History:  Anna Fuller presents today for a followup visit. She is doing well since her last visit. She is not having any nausea or vomiting. She does report grade 1 fatigue related to her hydroxyurea.  She had not reported any oral ulcers.  Did not report any lesions.  Had not had any major changes in her performance status or activity level.  She has continued to enjoy good quality of life at the time being.No bleeding or thrombotic events noted. No new complications with Hydroxyurea. No new illness or hospitalizations since her last visit.    Medications: I have reviewed the patient's current medications. Current outpatient prescriptions:aspirin 81 MG tablet, Take 81 mg by mouth daily.  , Disp: , Rfl: ;  cholecalciferol (VITAMIN D) 1000 UNITS tablet, Take 2,000 Units by mouth daily., Disp: , Rfl: ;  estrogens, conjugated, (PREMARIN) 0.3 MG tablet, Take 1 tablet (0.3 mg total) by mouth daily. Take daily for 21 days then do not take for 7 days., Disp: 30 tablet, Rfl: 11 hydroxyurea (HYDREA) 500 MG capsule, Take one tab twice a day.  Every other night take an additional tablet at night.  (Alternate 1000mg  and 1500mg )., Disp: 120 capsule, Rfl: 3;  simvastatin (ZOCOR) 40 MG tablet, Take 1 tablet (40 mg total) by mouth at bedtime., Disp: 30 tablet, Rfl: 11  Allergies:  Allergies  Allergen  Reactions  . Iodine Anaphylaxis    Contrast dye  . Codeine     REACTION: severe nausea/vomiting  . Penicillins     Past Medical History, Surgical history, Social history, and Family History were reviewed and updated.  Review of Systems: Constitutional:  Negative for fever, chills, night sweats, anorexia, weight loss, pain. Cardiovascular: no chest pain or dyspnea on exertion Respiratory: no cough, shortness of breath, or wheezing Neurological: no TIA or stroke symptoms Dermatological: negative ENT: negative Skin: negative. Gastrointestinal: no abdominal pain, change in bowel habits, or black or bloody stools Genito-Urinary: no dysuria, trouble voiding, or hematuria Hematological and Lymphatic: negative Breast: negative for breast lumps negative Musculoskeletal: negative Remaining ROS negative. Physical Exam: Blood pressure 109/59, pulse 69, temperature 97.9 F (36.6 C), temperature source Oral, resp. rate 18, height 5\' 3"  (1.6 m), weight 134 lb 4.8 oz (60.918 kg). ECOG: 1 General appearance: alert Head: Normocephalic, without obvious abnormality, atraumatic Neck: no adenopathy, no carotid bruit, no JVD, supple, symmetrical, trachea midline and thyroid not enlarged, symmetric, no tenderness/mass/nodules Lymph nodes: Cervical, supraclavicular, and axillary nodes normal. Heart:regular rate and rhythm, S1, S2 normal, no murmur, click, rub or gallop Lung:chest clear, no wheezing, rales, normal symmetric air entry, Heart exam - S1, S2 normal, no murmur, no gallop, rate regular Abdomin: soft, non-tender, without masses or organomegaly EXT:no erythema, induration, or nodules   Lab Results: Lab Results  Component Value Date   WBC 5.3 12/07/2012   HGB 14.6 12/07/2012   HCT 41.6 12/07/2012   MCV 115.3* 12/07/2012  PLT 362 12/07/2012     Chemistry      Component Value Date/Time   NA 139 10/11/2012 0824   NA 143 08/17/2012 0915   K 3.4* 10/11/2012 0824   K 4.1 08/17/2012 0915   CL  102 10/11/2012 0824   CL 107 08/17/2012 0915   CO2 27 10/11/2012 0824   CO2 29 08/17/2012 0915   BUN 13.0 10/11/2012 0824   BUN 14 08/17/2012 0915   CREATININE 0.9 10/11/2012 0824   CREATININE 0.79 08/17/2012 0915   CREATININE 0.94 04/11/2012 0839      Component Value Date/Time   CALCIUM 9.1 10/11/2012 0824   CALCIUM 9.2 08/17/2012 0915   ALKPHOS 61 10/11/2012 0824   ALKPHOS 53 08/17/2012 0915   AST 18 10/11/2012 0824   AST 17 08/17/2012 0915   ALT 8 10/11/2012 0824   ALT 11 08/17/2012 0915   BILITOT 0.53 10/11/2012 0824   BILITOT 0.4 08/17/2012 0915      Impression and Plan:  This is a pleasant 67 year old female with the following issues. 1. Myeloproliferative disorder presenting with essential thrombocythemia.  She is currently on hydroxyurea alternating between 1000 and 1500 mg cell.  Platelet count is under good control at this time (362).  The plan is to continue on the current dose and regimen and continue to monitor every 2 months. I continued to discuss with her the effect of long term use of hydroxyurea. She wants to continue with the treatment for now.  2. Thrombosis prophylaxis.  Continue to be on aspirin.       [facility], MD 3/13/20149:24 AM

## 2013-01-30 ENCOUNTER — Ambulatory Visit (HOSPITAL_BASED_OUTPATIENT_CLINIC_OR_DEPARTMENT_OTHER): Payer: Medicare Other | Admitting: Oncology

## 2013-01-30 ENCOUNTER — Other Ambulatory Visit (HOSPITAL_BASED_OUTPATIENT_CLINIC_OR_DEPARTMENT_OTHER): Payer: Medicare Other | Admitting: Lab

## 2013-01-30 ENCOUNTER — Telehealth: Payer: Self-pay | Admitting: Oncology

## 2013-01-30 VITALS — BP 123/62 | HR 68 | Temp 97.3°F | Resp 18 | Ht 63.0 in | Wt 134.4 lb

## 2013-01-30 DIAGNOSIS — D473 Essential (hemorrhagic) thrombocythemia: Secondary | ICD-10-CM | POA: Diagnosis not present

## 2013-01-30 LAB — CBC WITH DIFFERENTIAL/PLATELET
EOS%: 0.7 % (ref 0.0–7.0)
Eosinophils Absolute: 0 10*3/uL (ref 0.0–0.5)
LYMPH%: 25.3 % (ref 14.0–49.7)
MCH: 39.6 pg — ABNORMAL HIGH (ref 25.1–34.0)
MCV: 115.1 fL — ABNORMAL HIGH (ref 79.5–101.0)
MONO%: 8.3 % (ref 0.0–14.0)
NEUT#: 3.9 10*3/uL (ref 1.5–6.5)
Platelets: 413 10*3/uL — ABNORMAL HIGH (ref 145–400)
RBC: 3.68 10*6/uL — ABNORMAL LOW (ref 3.70–5.45)

## 2013-01-30 LAB — COMPREHENSIVE METABOLIC PANEL (CC13)
Alkaline Phosphatase: 70 U/L (ref 40–150)
BUN: 10.9 mg/dL (ref 7.0–26.0)
Glucose: 88 mg/dl (ref 70–99)
Sodium: 141 mEq/L (ref 136–145)
Total Bilirubin: 0.53 mg/dL (ref 0.20–1.20)
Total Protein: 7 g/dL (ref 6.4–8.3)

## 2013-01-30 NOTE — Telephone Encounter (Signed)
gv pt appt...pt did not was copies ot [place] or avs...pt aware

## 2013-01-30 NOTE — Progress Notes (Signed)
Hematology and Oncology Follow Up Visit  Anna Fuller [id number] 06-Jun-1946 67 y.o. 01/30/2013 9:12 AM      Principle Diagnosis: This is a 67 year old female with essential thrombocythemia.  She has JAK2 positive myeloproliferative disorder diagnosed 2007.  Prior Therapy: 1. She is status post anagrelide therapy discontinued due to do intolerance. 2. Status post bone marrow biopsy in August 2008 which showed early signs of myeloproliferative disorder.  Current therapy: She is on hydroxyurea alternating 1000 and 1500 mg every other day, has been treated with hydroxyurea for the last 4 years or so.  Interim History:  Anna Fuller presents today for a followup visit. She is doing well since her last visit. She is not having any nausea or vomiting. She does report grade 1 fatigue related to her hydroxyurea.  She had not reported any oral ulcers.  Did not report any lesions.  Had not had any major changes in her performance status or activity level.  She has continued to enjoy good quality of life at the time being.No bleeding or thrombotic events noted. No new complications with Hydroxyurea. No new illness or hospitalizations since her last visit. She is stressed out a bet as she sold her house and ready to move out.    Medications: I have reviewed the patient's current medications. Current outpatient prescriptions:aspirin 81 MG tablet, Take 81 mg by mouth daily.  , Disp: , Rfl: ;  cholecalciferol (VITAMIN D) 1000 UNITS tablet, Take 2,000 Units by mouth daily., Disp: , Rfl: ;  estrogens, conjugated, (PREMARIN) 0.3 MG tablet, Take 1 tablet (0.3 mg total) by mouth daily. Take daily for 21 days then do not take for 7 days., Disp: 30 tablet, Rfl: 11 hydroxyurea (HYDREA) 500 MG capsule, Take one tab twice a day.  Every other night take an additional tablet at night.  (Alternate 1000mg  and 1500mg )., Disp: 120 capsule, Rfl: 3;  simvastatin (ZOCOR) 40 MG tablet, Take 1 tablet (40 mg total) by mouth at  bedtime., Disp: 30 tablet, Rfl: 11  Allergies:  Allergies  Allergen Reactions  . Iodine Anaphylaxis    Contrast dye  . Codeine     REACTION: severe nausea/vomiting  . Penicillins     Past Medical History, Surgical history, Social history, and Family History were reviewed and updated.  Review of Systems: Constitutional:  Negative for fever, chills, night sweats, anorexia, weight loss, pain. Cardiovascular: no chest pain or dyspnea on exertion Respiratory: no cough, shortness of breath, or wheezing Neurological: no TIA or stroke symptoms Dermatological: negative ENT: negative Skin: negative. Gastrointestinal: no abdominal pain, change in bowel habits, or black or bloody stools Genito-Urinary: no dysuria, trouble voiding, or hematuria Hematological and Lymphatic: negative Breast: negative for breast lumps negative Musculoskeletal: negative Remaining ROS negative. Physical Exam: Blood pressure 123/62, pulse 68, temperature 97.3 F (36.3 C), temperature source Oral, resp. rate 18, height 5\' 3"  (1.6 m), weight 134 lb 6.4 oz (60.963 kg). ECOG: 1 General appearance: alert Head: Normocephalic, without obvious abnormality, atraumatic Neck: no adenopathy, no carotid bruit, no JVD, supple, symmetrical, trachea midline and thyroid not enlarged, symmetric, no tenderness/mass/nodules Lymph nodes: Cervical, supraclavicular, and axillary nodes normal. Heart:regular rate and rhythm, S1, S2 normal, no murmur, click, rub or gallop Lung:chest clear, no wheezing, rales, normal symmetric air entry, Heart exam - S1, S2 normal, no murmur, no gallop, rate regular Abdomin: soft, non-tender, without masses or organomegaly EXT:no erythema, induration, or nodules   Lab Results: Lab Results  Component Value Date   WBC 6.1  01/30/2013   HGB 14.6 01/30/2013   HCT 42.3 01/30/2013   MCV 115.1* 01/30/2013   PLT 413* 01/30/2013     Chemistry      Component Value Date/Time   NA 141 12/07/2012 0829   NA 143  08/17/2012 0915   K 3.7 12/07/2012 0829   K 4.1 08/17/2012 0915   CL 102 12/07/2012 0829   CL 107 08/17/2012 0915   CO2 26 12/07/2012 0829   CO2 29 08/17/2012 0915   BUN 9.6 12/07/2012 0829   BUN 14 08/17/2012 0915   CREATININE 1.0 12/07/2012 0829   CREATININE 0.79 08/17/2012 0915   CREATININE 0.94 04/11/2012 0839      Component Value Date/Time   CALCIUM 9.5 12/07/2012 0829   CALCIUM 9.2 08/17/2012 0915   ALKPHOS 71 12/07/2012 0829   ALKPHOS 53 08/17/2012 0915   AST 22 12/07/2012 0829   AST 17 08/17/2012 0915   ALT 17 12/07/2012 0829   ALT 11 08/17/2012 0915   BILITOT 0.55 12/07/2012 0829   BILITOT 0.4 08/17/2012 0915      Impression and Plan:  This is a pleasant 67 year old female with the following issues. 1. Myeloproliferative disorder presenting with essential thrombocythemia.  She is currently on hydroxyurea alternating between 1000 and 1500 mg cell.  Platelet count is under good control at this time (413) but slightly up.  The plan is to continue on the current dose and regimen and continue to monitor every 2 months. I continued to discuss with her the effect of long term use of hydroxyurea. She wants to continue with the treatment for now.  2. Thrombosis prophylaxis.  Continue to be on aspirin.       [facility], MD 5/6/20149:12 AM

## 2013-02-20 ENCOUNTER — Telehealth: Payer: Self-pay | Admitting: Dietician

## 2013-04-03 ENCOUNTER — Ambulatory Visit (HOSPITAL_BASED_OUTPATIENT_CLINIC_OR_DEPARTMENT_OTHER): Payer: Medicare Other | Admitting: Oncology

## 2013-04-03 ENCOUNTER — Telehealth: Payer: Self-pay | Admitting: Oncology

## 2013-04-03 ENCOUNTER — Other Ambulatory Visit (HOSPITAL_BASED_OUTPATIENT_CLINIC_OR_DEPARTMENT_OTHER): Payer: Medicare Other | Admitting: Lab

## 2013-04-03 VITALS — BP 122/65 | HR 59 | Temp 97.5°F | Resp 20

## 2013-04-03 DIAGNOSIS — D473 Essential (hemorrhagic) thrombocythemia: Secondary | ICD-10-CM

## 2013-04-03 LAB — COMPREHENSIVE METABOLIC PANEL (CC13)
ALT: 12 U/L (ref 0–55)
AST: 17 U/L (ref 5–34)
Albumin: 3.8 g/dL (ref 3.5–5.0)
Alkaline Phosphatase: 67 U/L (ref 40–150)
BUN: 12.8 mg/dL (ref 7.0–26.0)
CO2: 25 meq/L (ref 22–29)
Calcium: 9.3 mg/dL (ref 8.4–10.4)
Chloride: 106 meq/L (ref 98–109)
Creatinine: 0.9 mg/dL (ref 0.6–1.1)
Glucose: 88 mg/dL (ref 70–140)
Potassium: 3.6 meq/L (ref 3.5–5.1)
Sodium: 141 meq/L (ref 136–145)
Total Bilirubin: 0.69 mg/dL (ref 0.20–1.20)
Total Protein: 6.7 g/dL (ref 6.4–8.3)

## 2013-04-03 LAB — CBC WITH DIFFERENTIAL/PLATELET
BASO%: 0.7 % (ref 0.0–2.0)
Basophils Absolute: 0 10e3/uL (ref 0.0–0.1)
EOS%: 1.2 % (ref 0.0–7.0)
Eosinophils Absolute: 0.1 10e3/uL (ref 0.0–0.5)
HCT: 42.7 % (ref 34.8–46.6)
HGB: 14.8 g/dL (ref 11.6–15.9)
LYMPH%: 25.1 % (ref 14.0–49.7)
MCH: 39.4 pg — ABNORMAL HIGH (ref 25.1–34.0)
MCHC: 34.8 g/dL (ref 31.5–36.0)
MCV: 113.4 fL — ABNORMAL HIGH (ref 79.5–101.0)
MONO#: 0.6 10e3/uL (ref 0.1–0.9)
MONO%: 8.8 % (ref 0.0–14.0)
NEUT#: 4.2 10e3/uL (ref 1.5–6.5)
NEUT%: 64.2 % (ref 38.4–76.8)
Platelets: 386 10e3/uL (ref 145–400)
RBC: 3.76 10e6/uL (ref 3.70–5.45)
RDW: 12.7 % (ref 11.2–14.5)
WBC: 6.5 10e3/uL (ref 3.9–10.3)
lymph#: 1.6 10e3/uL (ref 0.9–3.3)

## 2013-04-03 NOTE — Telephone Encounter (Signed)
Gave pt appt for lab, MD , chemo for September 2014 °

## 2013-04-03 NOTE — Progress Notes (Signed)
Hematology and Oncology Follow Up Visit  Anna Fuller [id number] 10-02-1945 67 y.o. 04/03/2013 9:50 AM      Principle Diagnosis: This is a 67 year old female with essential thrombocythemia.  She has JAK2 positive myeloproliferative disorder diagnosed 2007.  Prior Therapy: 1. She is status post anagrelide therapy discontinued due to do intolerance. 2. Status post bone marrow biopsy in August 2008 which showed early signs of myeloproliferative disorder.  Current therapy: She is on hydroxyurea alternating 1000 and 1500 mg every other day, has been treated with hydroxyurea for the last 4 years or so.  Interim History:  Anna Fuller presents today for a followup visit. She is doing well since her last visit. She is not having any nausea or vomiting. She does report grade 1 fatigue related to her hydroxyurea.  She had not reported any oral ulcers.  Did not report any lesions.  Had not had any major changes in her performance status or activity level.  She has continued to enjoy good quality of life at the time being. No bleeding or thrombotic events noted. No new complications with Hydroxyurea. No new illness or hospitalizations since her last visit. No bleeding or clotting complications.    Medications: I have reviewed the patient's current medications. Current Outpatient Prescriptions  Medication Sig Dispense Refill  . aspirin 81 MG tablet Take 81 mg by mouth daily.        . cholecalciferol (VITAMIN D) 1000 UNITS tablet Take 2,000 Units by mouth daily.      Marland Kitchen estrogens, conjugated, (PREMARIN) 0.3 MG tablet Take 1 tablet (0.3 mg total) by mouth daily. Take daily for 21 days then do not take for 7 days.  30 tablet  11  . hydroxyurea (HYDREA) 500 MG capsule Take one tab twice a day.  Every other night take an additional tablet at night.  (Alternate 1000mg  and 1500mg ).  120 capsule  3  . simvastatin (ZOCOR) 40 MG tablet Take 1 tablet (40 mg total) by mouth at bedtime.  30 tablet  11   No  current facility-administered medications for this visit.    Allergies:  Allergies  Allergen Reactions  . Iodine Anaphylaxis    Contrast dye  . Codeine     REACTION: severe nausea/vomiting  . Penicillins     Past Medical History, Surgical history, Social history, and Family History were reviewed and updated.  Review of Systems: Constitutional:  Negative for fever, chills, night sweats, anorexia, weight loss, pain. Cardiovascular: no chest pain or dyspnea on exertion Respiratory: no cough, shortness of breath, or wheezing Neurological: no TIA or stroke symptoms Dermatological: negative ENT: negative Skin: negative. Gastrointestinal: no abdominal pain, change in bowel habits, or black or bloody stools Genito-Urinary: no dysuria, trouble voiding, or hematuria Hematological and Lymphatic: negative Breast: negative for breast lumps negative Musculoskeletal: negative Remaining ROS negative. Physical Exam: Blood pressure 122/65, pulse 59, temperature 97.5 F (36.4 C), temperature source Oral, resp. rate 20. ECOG: 1 General appearance: alert Head: Normocephalic, without obvious abnormality, atraumatic Neck: no adenopathy, no carotid bruit, no JVD, supple, symmetrical, trachea midline and thyroid not enlarged, symmetric, no tenderness/mass/nodules Lymph nodes: Cervical, supraclavicular, and axillary nodes normal. Heart:regular rate and rhythm, S1, S2 normal, no murmur, click, rub or gallop Lung:chest clear, no wheezing, rales, normal symmetric air entry, Heart exam - S1, S2 normal, no murmur, no gallop, rate regular Abdomin: soft, non-tender, without masses or organomegaly EXT:no erythema, induration, or nodules   Lab Results: Lab Results  Component Value Date  WBC 6.5 04/03/2013   HGB 14.8 04/03/2013   HCT 42.7 04/03/2013   MCV 113.4* 04/03/2013   PLT 386 04/03/2013     Chemistry      Component Value Date/Time   NA 141 04/03/2013 0833   NA 143 08/17/2012 0915   K 3.6 04/03/2013  0833   K 4.1 08/17/2012 0915   CL 105 01/30/2013 0825   CL 107 08/17/2012 0915   CO2 25 04/03/2013 0833   CO2 29 08/17/2012 0915   BUN 12.8 04/03/2013 0833   BUN 14 08/17/2012 0915   CREATININE 0.9 04/03/2013 0833   CREATININE 0.79 08/17/2012 0915   CREATININE 0.94 04/11/2012 0839      Component Value Date/Time   CALCIUM 9.3 04/03/2013 0833   CALCIUM 9.2 08/17/2012 0915   ALKPHOS 67 04/03/2013 0833   ALKPHOS 53 08/17/2012 0915   AST 17 04/03/2013 0833   AST 17 08/17/2012 0915   ALT 12 04/03/2013 0833   ALT 11 08/17/2012 0915   BILITOT 0.69 04/03/2013 0833   BILITOT 0.4 08/17/2012 0915      Impression and Plan:  This is a pleasant 67 year old female with the following issues. 1. Myeloproliferative disorder presenting with essential thrombocythemia.  She is currently on hydroxyurea alternating between 1000 and 1500 mg cell.  Platelet count is under good control at this time (386).  The plan is to continue on the current dose and regimen and continue to monitor every 2 months. I continued to discuss with her the effect of long term use of hydroxyurea. She wants to continue with the treatment for now.  2. Thrombosis prophylaxis.  Continue to be on aspirin.       [facility], MD 7/8/20149:50 AM

## 2013-05-17 DIAGNOSIS — Z23 Encounter for immunization: Secondary | ICD-10-CM | POA: Diagnosis not present

## 2013-06-04 ENCOUNTER — Other Ambulatory Visit: Payer: Self-pay

## 2013-06-04 DIAGNOSIS — Z1231 Encounter for screening mammogram for malignant neoplasm of breast: Secondary | ICD-10-CM

## 2013-06-07 ENCOUNTER — Telehealth: Payer: Self-pay | Admitting: Oncology

## 2013-06-07 ENCOUNTER — Ambulatory Visit (HOSPITAL_BASED_OUTPATIENT_CLINIC_OR_DEPARTMENT_OTHER): Payer: Medicare Other | Admitting: Oncology

## 2013-06-07 ENCOUNTER — Other Ambulatory Visit (HOSPITAL_BASED_OUTPATIENT_CLINIC_OR_DEPARTMENT_OTHER): Payer: Medicare Other | Admitting: Lab

## 2013-06-07 VITALS — BP 116/47 | HR 62 | Temp 97.8°F | Resp 20 | Ht 63.0 in | Wt 125.9 lb

## 2013-06-07 DIAGNOSIS — D473 Essential (hemorrhagic) thrombocythemia: Secondary | ICD-10-CM

## 2013-06-07 DIAGNOSIS — D75839 Thrombocytosis, unspecified: Secondary | ICD-10-CM

## 2013-06-07 LAB — CBC WITH DIFFERENTIAL/PLATELET
Basophils Absolute: 0.1 10*3/uL (ref 0.0–0.1)
Eosinophils Absolute: 0.1 10*3/uL (ref 0.0–0.5)
HGB: 14.3 g/dL (ref 11.6–15.9)
LYMPH%: 27.7 % (ref 14.0–49.7)
MCV: 112.5 fL — ABNORMAL HIGH (ref 79.5–101.0)
MONO%: 7.1 % (ref 0.0–14.0)
NEUT#: 4 10*3/uL (ref 1.5–6.5)
Platelets: 388 10*3/uL (ref 145–400)
RDW: 13.4 % (ref 11.2–14.5)

## 2013-06-07 LAB — COMPREHENSIVE METABOLIC PANEL (CC13)
Albumin: 3.7 g/dL (ref 3.5–5.0)
Alkaline Phosphatase: 56 U/L (ref 40–150)
BUN: 10.7 mg/dL (ref 7.0–26.0)
Calcium: 9.1 mg/dL (ref 8.4–10.4)
Glucose: 90 mg/dl (ref 70–140)
Potassium: 3.5 mEq/L (ref 3.5–5.1)

## 2013-06-07 MED ORDER — HYDROXYUREA 500 MG PO CAPS: ORAL_CAPSULE | ORAL | Status: DC

## 2013-06-07 NOTE — Telephone Encounter (Signed)
gv and printed appt sched and avs for ptf or Nov °

## 2013-06-07 NOTE — Progress Notes (Signed)
Hematology and Oncology Follow Up Visit  Anna Fuller [id number] January 05, 1946 67 y.o. 06/07/2013 8:29 AM      Principle Diagnosis: This is a 67 year old female with essential thrombocythemia.  She has JAK2 positive myeloproliferative disorder diagnosed 2007.  Prior Therapy: 1. She is status post anagrelide therapy discontinued due to do intolerance. 2. Status post bone marrow biopsy in August 2008 which showed early signs of myeloproliferative disorder.  Current therapy: She is on hydroxyurea alternating 1000 and 1500 mg every other day, has been treated with hydroxyurea since 2009.   Interim History:  Anna Fuller presents today for a followup visit. She is doing well since her last visit. She is not having any nausea or vomiting. She does report grade 1 fatigue related to her hydroxyurea.  She had not reported any oral ulcers.  Did not report any lesions.  Had not had any major changes in her performance status or activity level.  She has continued to enjoy good quality of life at the time being. No bleeding or thrombotic events noted. No new complications with Hydroxyurea. No new illness or hospitalizations since her last visit. No bleeding or clotting complications. She did report losing weight without decline in her apatite.    Medications: I have reviewed the patient's current medications. Current Outpatient Prescriptions  Medication Sig Dispense Refill  . aspirin 81 MG tablet Take 81 mg by mouth daily.        . cholecalciferol (VITAMIN D) 1000 UNITS tablet Take 2,000 Units by mouth daily.      Marland Kitchen estrogens, conjugated, (PREMARIN) 0.3 MG tablet Take 1 tablet (0.3 mg total) by mouth daily. Take daily for 21 days then do not take for 7 days.  30 tablet  11  . hydroxyurea (HYDREA) 500 MG capsule Take one tab twice a day.  Every other night take an additional tablet at night.  (Alternate 1000mg  and 1500mg ).  120 capsule  3  . simvastatin (ZOCOR) 40 MG tablet Take 1 tablet (40 mg total) by  mouth at bedtime.  30 tablet  11   No current facility-administered medications for this visit.    Allergies:  Allergies  Allergen Reactions  . Iodine Anaphylaxis    Contrast dye  . Codeine     REACTION: severe nausea/vomiting  . Penicillins     Past Medical History, Surgical history, Social history, and Family History were reviewed and updated.  Review of Systems: Remaining ROS negative. Physical Exam: Blood pressure 116/47, pulse 62, temperature 97.8 F (36.6 C), temperature source Oral, resp. rate 20, height 5\' 3"  (1.6 m), weight 125 lb 14.4 oz (57.108 kg). ECOG: 1 General appearance: alert Head: Normocephalic, without obvious abnormality, atraumatic Neck: no adenopathy, no carotid bruit, no JVD, supple, symmetrical, trachea midline and thyroid not enlarged, symmetric, no tenderness/mass/nodules Lymph nodes: Cervical, supraclavicular, and axillary nodes normal. Heart:regular rate and rhythm, S1, S2 normal, no murmur, click, rub or gallop Lung:chest clear, no wheezing, rales, normal symmetric air entry, Heart exam - S1, S2 normal, no murmur, no gallop, rate regular Abdomin: soft, non-tender, without masses or organomegaly EXT:no erythema, induration, or nodules   Lab Results: Lab Results  Component Value Date   WBC 6.3 06/07/2013   HGB 14.3 06/07/2013   HCT 41.7 06/07/2013   MCV 112.5* 06/07/2013   PLT 388 06/07/2013     Chemistry      Component Value Date/Time   NA 141 04/03/2013 0833   NA 143 08/17/2012 0915   K 3.6 04/03/2013 1191  K 4.1 08/17/2012 0915   CL 105 01/30/2013 0825   CL 107 08/17/2012 0915   CO2 25 04/03/2013 0833   CO2 29 08/17/2012 0915   BUN 12.8 04/03/2013 0833   BUN 14 08/17/2012 0915   CREATININE 0.9 04/03/2013 0833   CREATININE 0.79 08/17/2012 0915   CREATININE 0.94 04/11/2012 0839      Component Value Date/Time   CALCIUM 9.3 04/03/2013 0833   CALCIUM 9.2 08/17/2012 0915   ALKPHOS 67 04/03/2013 0833   ALKPHOS 53 08/17/2012 0915   AST 17 04/03/2013  0833   AST 17 08/17/2012 0915   ALT 12 04/03/2013 0833   ALT 11 08/17/2012 0915   BILITOT 0.69 04/03/2013 0833   BILITOT 0.4 08/17/2012 0915      Impression and Plan:  This is a pleasant 67 year old female with the following issues. 1. Myeloproliferative disorder presenting with essential thrombocythemia.  She is currently on hydroxyurea alternating between 1000 and 1500 mg.  Platelet count is under good control at this time (388).  The plan is to continue on the current dose and regimen and continue to monitor every 2 months. I continued to discuss with her the effect of long term use of hydroxyurea. She wants to continue with the treatment for now.  2. Thrombosis prophylaxis.  Continue to be on aspirin.   3. Weight loss: no symptoms at this time. We will continue to follow closely.      [facility], MD 9/11/20148:29 AM

## 2013-07-12 ENCOUNTER — Ambulatory Visit
Admission: RE | Admit: 2013-07-12 | Discharge: 2013-07-12 | Disposition: A | Discharge status: Home / Self Care | Payer: Medicare Other | Source: Ambulatory Visit

## 2013-07-12 DIAGNOSIS — Z1231 Encounter for screening mammogram for malignant neoplasm of breast: Secondary | ICD-10-CM

## 2013-08-02 ENCOUNTER — Other Ambulatory Visit: Payer: Self-pay

## 2013-08-03 ENCOUNTER — Telehealth: Payer: Self-pay | Admitting: Oncology

## 2013-08-03 ENCOUNTER — Ambulatory Visit (HOSPITAL_BASED_OUTPATIENT_CLINIC_OR_DEPARTMENT_OTHER): Payer: Medicare Other | Admitting: Oncology

## 2013-08-03 ENCOUNTER — Other Ambulatory Visit (HOSPITAL_BASED_OUTPATIENT_CLINIC_OR_DEPARTMENT_OTHER): Payer: Medicare Other | Admitting: Lab

## 2013-08-03 VITALS — BP 111/63 | HR 74 | Temp 97.0°F | Resp 18 | Ht 63.0 in | Wt 124.2 lb

## 2013-08-03 DIAGNOSIS — D47Z9 Other specified neoplasms of uncertain behavior of lymphoid, hematopoietic and related tissue: Secondary | ICD-10-CM | POA: Diagnosis not present

## 2013-08-03 DIAGNOSIS — D473 Essential (hemorrhagic) thrombocythemia: Secondary | ICD-10-CM

## 2013-08-03 LAB — CBC WITH DIFFERENTIAL/PLATELET
BASO%: 0.8 % (ref 0.0–2.0)
Basophils Absolute: 0.1 10*3/uL (ref 0.0–0.1)
Eosinophils Absolute: 0 10*3/uL (ref 0.0–0.5)
HCT: 42.7 % (ref 34.8–46.6)
HGB: 14.6 g/dL (ref 11.6–15.9)
LYMPH%: 23.6 % (ref 14.0–49.7)
MONO#: 0.5 10*3/uL (ref 0.1–0.9)
NEUT#: 5.1 10*3/uL (ref 1.5–6.5)
NEUT%: 67.7 % (ref 38.4–76.8)
Platelets: 473 10*3/uL — ABNORMAL HIGH (ref 145–400)
WBC: 7.5 10*3/uL (ref 3.9–10.3)
lymph#: 1.8 10*3/uL (ref 0.9–3.3)

## 2013-08-03 LAB — COMPREHENSIVE METABOLIC PANEL (CC13)
ALT: 15 U/L (ref 0–55)
Anion Gap: 11 mEq/L (ref 3–11)
BUN: 13.6 mg/dL (ref 7.0–26.0)
CO2: 24 mEq/L (ref 22–29)
Calcium: 9.8 mg/dL (ref 8.4–10.4)
Chloride: 102 mEq/L (ref 98–109)
Creatinine: 0.9 mg/dL (ref 0.6–1.1)
Glucose: 93 mg/dl (ref 70–140)

## 2013-08-03 NOTE — Progress Notes (Signed)
Hematology and Oncology Follow Up Visit  Anna Fuller [id number] October 06, 1945 67 y.o. 08/03/2013 9:25 AM      Principle Diagnosis: This is a 67 year old female with essential thrombocythemia.  She has JAK2 positive myeloproliferative disorder diagnosed 2007.  Prior Therapy: 1. She is status post anagrelide therapy discontinued due to do intolerance. 2. Status post bone marrow biopsy in August 2008 which showed early signs of myeloproliferative disorder.  Current therapy: She is on hydroxyurea alternating 1000 and 1500 mg every other day, has been treated with hydroxyurea since 2009.   Interim History:  Anna Fuller presents today for a followup visit. She is doing well since her last visit. She is not having any nausea or vomiting. She does report grade 1 fatigue related to her hydroxyurea.  She had not reported any oral ulcers.  Did not report any lesions.  Had not had any major changes in her performance status or activity level.  She has continued to enjoy good quality of life at the time being. No bleeding or thrombotic events noted. No new complications with Hydroxyurea. No new illness or hospitalizations since her last visit. No bleeding or clotting complications. She did report losing weight without decline in her apatite. But she was able to maintain better this time around. She reports some occasional numbness in her middle finger as well as her back of her head when she lies down. She does not report missing any doses of hydroxyurea.   Medications: I have reviewed the patient's current medications. Current Outpatient Prescriptions  Medication Sig Dispense Refill  . aspirin 81 MG tablet Take 81 mg by mouth daily.        . cholecalciferol (VITAMIN D) 1000 UNITS tablet Take 2,000 Units by mouth daily.      Marland Kitchen estrogens, conjugated, (PREMARIN) 0.3 MG tablet Take 1 tablet (0.3 mg total) by mouth daily. Take daily for 21 days then do not take for 7 days.  30 tablet  11  . hydroxyurea  (HYDREA) 500 MG capsule Take one tab twice a day.  Every other night take an additional tablet at night.  (Alternate 1000mg  and 1500mg ).  120 capsule  3  . simvastatin (ZOCOR) 40 MG tablet Take 1 tablet (40 mg total) by mouth at bedtime.  30 tablet  11   No current facility-administered medications for this visit.    Allergies:  Allergies  Allergen Reactions  . Iodine Anaphylaxis    Contrast dye  . Codeine     REACTION: severe nausea/vomiting  . Penicillins     Past Medical History, Surgical history, Social history, and Family History were reviewed and updated.  Review of Systems: Remaining ROS negative. Physical Exam: Blood pressure 111/63, pulse 74, temperature 97 F (36.1 C), temperature source Oral, resp. rate 18, height 5\' 3"  (1.6 m), weight 124 lb 3.2 oz (56.337 kg). ECOG: 1 General appearance: alert Head: Normocephalic, without obvious abnormality, atraumatic Neck: no adenopathy, no carotid bruit, no JVD, supple, symmetrical, trachea midline and thyroid not enlarged, symmetric, no tenderness/mass/nodules Lymph nodes: Cervical, supraclavicular, and axillary nodes normal. Heart:regular rate and rhythm, S1, S2 normal, no murmur, click, rub or gallop Lung:chest clear, no wheezing, rales, normal symmetric air entry, Heart exam - S1, S2 normal, no murmur, no gallop, rate regular Abdomin: soft, non-tender, without masses or organomegaly EXT:no erythema, induration, or nodules   Lab Results: Lab Results  Component Value Date   WBC 7.5 08/03/2013   HGB 14.6 08/03/2013   HCT 42.7 08/03/2013  MCV 114.6* 08/03/2013   PLT 473* 08/03/2013     Chemistry      Component Value Date/Time   NA 143 06/07/2013 0756   NA 143 08/17/2012 0915   K 3.5 06/07/2013 0756   K 4.1 08/17/2012 0915   CL 105 01/30/2013 0825   CL 107 08/17/2012 0915   CO2 26 06/07/2013 0756   CO2 29 08/17/2012 0915   BUN 10.7 06/07/2013 0756   BUN 14 08/17/2012 0915   CREATININE 0.8 06/07/2013 0756   CREATININE 0.79  08/17/2012 0915   CREATININE 0.94 04/11/2012 0839      Component Value Date/Time   CALCIUM 9.1 06/07/2013 0756   CALCIUM 9.2 08/17/2012 0915   ALKPHOS 56 06/07/2013 0756   ALKPHOS 53 08/17/2012 0915   AST 21 06/07/2013 0756   AST 17 08/17/2012 0915   ALT 15 06/07/2013 0756   ALT 11 08/17/2012 0915   BILITOT 0.46 06/07/2013 0756   BILITOT 0.4 08/17/2012 0915      Impression and Plan:  This is a pleasant 67 year old female with the following issues. 1. Myeloproliferative disorder presenting with essential thrombocythemia.  She is currently on hydroxyurea alternating between 1000 and 1500 mg.  Platelet count is slightly higher today but still under excellent control..  The plan is to continue on the current dose and regimen and continue to monitor every 2 months. I continued to discuss with her the effect of long term use of hydroxyurea. She wants to continue with the treatment for now.  2. Thrombosis prophylaxis.  Continue to be on aspirin.   3. Weight loss: no symptoms at this time. We will continue to follow closely.      Lewayne Pauley, MD 11/7/20149:25 AM

## 2013-08-03 NOTE — Telephone Encounter (Signed)
gv and printed appt sched and avs for pt for Jan 2015 °

## 2013-08-15 ENCOUNTER — Other Ambulatory Visit: Payer: Self-pay | Admitting: *Deleted

## 2013-08-15 DIAGNOSIS — Z1329 Encounter for screening for other suspected endocrine disorder: Secondary | ICD-10-CM

## 2013-08-15 DIAGNOSIS — Z130 Encounter for screening for diseases of the blood and blood-forming organs and certain disorders involving the immune mechanism: Secondary | ICD-10-CM

## 2013-08-15 DIAGNOSIS — E785 Hyperlipidemia, unspecified: Secondary | ICD-10-CM

## 2013-08-15 DIAGNOSIS — E559 Vitamin D deficiency, unspecified: Secondary | ICD-10-CM

## 2013-08-15 DIAGNOSIS — D473 Essential (hemorrhagic) thrombocythemia: Secondary | ICD-10-CM

## 2013-08-20 ENCOUNTER — Other Ambulatory Visit: Payer: Medicare Other | Admitting: Internal Medicine

## 2013-08-20 DIAGNOSIS — D75839 Thrombocytosis, unspecified: Secondary | ICD-10-CM

## 2013-08-20 DIAGNOSIS — D473 Essential (hemorrhagic) thrombocythemia: Secondary | ICD-10-CM

## 2013-08-20 DIAGNOSIS — M8580 Other specified disorders of bone density and structure, unspecified site: Secondary | ICD-10-CM

## 2013-08-20 DIAGNOSIS — Z1329 Encounter for screening for other suspected endocrine disorder: Secondary | ICD-10-CM | POA: Diagnosis not present

## 2013-08-20 DIAGNOSIS — M899 Disorder of bone, unspecified: Secondary | ICD-10-CM | POA: Diagnosis not present

## 2013-08-20 DIAGNOSIS — E785 Hyperlipidemia, unspecified: Secondary | ICD-10-CM

## 2013-08-20 LAB — TSH: TSH: 2.227 u[IU]/mL (ref 0.350–4.500)

## 2013-08-20 LAB — LIPID PANEL
Cholesterol: 176 mg/dL (ref 0–200)
LDL Cholesterol: 82 mg/dL (ref 0–99)
Triglycerides: 114 mg/dL (ref <150)
VLDL: 23 mg/dL (ref 0–40)

## 2013-08-21 ENCOUNTER — Encounter: Payer: Self-pay | Admitting: Internal Medicine

## 2013-08-21 ENCOUNTER — Ambulatory Visit (INDEPENDENT_AMBULATORY_CARE_PROVIDER_SITE_OTHER): Payer: Medicare Other | Admitting: Internal Medicine

## 2013-08-21 VITALS — BP 130/72 | HR 64 | Temp 97.1°F | Ht 62.75 in | Wt 125.0 lb

## 2013-08-21 DIAGNOSIS — D473 Essential (hemorrhagic) thrombocythemia: Secondary | ICD-10-CM

## 2013-08-21 DIAGNOSIS — Z5181 Encounter for therapeutic drug level monitoring: Secondary | ICD-10-CM | POA: Diagnosis not present

## 2013-08-21 DIAGNOSIS — Z1589 Genetic susceptibility to other disease: Secondary | ICD-10-CM | POA: Diagnosis not present

## 2013-08-21 DIAGNOSIS — R634 Abnormal weight loss: Secondary | ICD-10-CM | POA: Diagnosis not present

## 2013-08-21 DIAGNOSIS — Z86010 Personal history of colonic polyps: Secondary | ICD-10-CM

## 2013-08-21 DIAGNOSIS — Z0000 Encounter for general adult medical examination without abnormal findings: Secondary | ICD-10-CM | POA: Diagnosis not present

## 2013-08-21 DIAGNOSIS — E785 Hyperlipidemia, unspecified: Secondary | ICD-10-CM

## 2013-08-21 LAB — VITAMIN D 25 HYDROXY (VIT D DEFICIENCY, FRACTURES): Vit D, 25-Hydroxy: 48 ng/mL (ref 30–89)

## 2013-08-21 MED ORDER — SIMVASTATIN 40 MG PO TABS: 40.0000 mg | ORAL_TABLET | Freq: Every day | ORAL | Status: DC

## 2013-08-21 MED ORDER — ESTROGENS CONJUGATED 0.3 MG PO TABS: 0.3000 mg | ORAL_TABLET | Freq: Every day | ORAL | Status: DC

## 2013-08-21 NOTE — Progress Notes (Signed)
Subjective:    Patient ID: Anna Fuller, female    DOB: 11/24/1945, 67 y.o.   MRN: [id number]  HPI Pleasant 67 year old female who first presented here November 2013 for primary care. She is followed by Dr. Clelia Croft for essentil thrombocytosis. She is treated with Hydrea. She has a JAK2 positive myeloproliferative disorder diagnosed in 2007. Bone marrow in 2008 short early signs of myeloproliferative disorder. Anagrelide was tried initially but patient did not tolerate it. Patient has been able to keep platelet counts around 400,000 with Hydrea alternating every other day 1000 mg and 1500 mg. She feels well. Plans to go to [facility] for Thanksgiving with her sister. Sees oncologist about every 2 months.  She is allergic to contrast dye, cortisone, Penicillin  Past Medical History: History of hyperlipidemia treated with simvastatin. Is on Premarin. Does not want to be off estrogen replacement.  Hospitalized with fever of unknown origin in 67. Cholecystectomy 1983. Hysterectomy with bilateral oophorectomy 1981. Right wrist surgery 2008. Foot surgery on both feet 1965, 1968, 1969. Has had left knee and right knee surgeries.  History of adenomatous colon polyps. Last colonoscopy June 2011 by Dr. Arlyce Dice. Next colonoscopy due 2015.  Social history: Patient is single and retired. Completed 2 years of college. Patient does not smoke or consume alcohol. Enjoys walking and doing yard work.  Family history: Her sister is Anna Fuller. No other siblings. Father died at age 57 with complications of diabetes. Mother died at age 5 of pulmonary fibrosis.  Immunizations: Tetanus immunization 2006, Pneumovax immunization 2011, Zostavax vaccine 2011.  Medications: Hydrea, simvastatin, baby aspirin daily.      Review of Systems  Constitutional: Negative.   All other systems reviewed and are negative.       Objective:   Physical Exam  Vitals reviewed. Constitutional: She is oriented to  person, place, and time. She appears well-developed. No distress.  HENT:  Head: Normocephalic and atraumatic.  Right Ear: External ear normal.  Left Ear: External ear normal.  Mouth/Throat: Oropharynx is clear and moist. No oropharyngeal exudate.  Eyes: Conjunctivae and EOM are normal. Pupils are equal, round, and reactive to light. Right eye exhibits no discharge. Left eye exhibits no discharge. No scleral icterus.  Neck: Neck supple. No JVD present. No thyromegaly present.  Cardiovascular: Normal rate, regular rhythm and normal heart sounds.   Pulmonary/Chest: Effort normal and breath sounds normal. No respiratory distress. She has no wheezes. She has no rales. She exhibits no tenderness.  Breasts normal female without masses  Abdominal: Soft. Bowel sounds are normal. She exhibits no distension and no mass. There is no tenderness. There is no rebound and no guarding.  Genitourinary:  Deferred status post TAH/BSO  Musculoskeletal: Normal range of motion. She exhibits no edema.  Lymphadenopathy:    She has no cervical adenopathy.  Neurological: She is alert and oriented to person, place, and time. She has normal reflexes. No cranial nerve deficit. Coordination normal.  Skin: Skin is warm and dry. No rash noted. She is not diaphoretic.  Psychiatric: She has a normal mood and affect. Her behavior is normal. Judgment and thought content normal.          Assessment & Plan:  Essential thrombocytosis-JAK2 myeloproliferative disorder  Hyperlipidemia  History of estrogen replacement  History of adenomatous colon polyps  Weight loss-closely followed by oncologist. Has lost 5-1/2 pounds since last visit here a year ago. Says she's eating well.  Plan: Return in one year or as needed. Continue close followup  with oncologist.       Subjective:   Patient presents for Medicare Annual/Subsequent preventive examination.   Review Past Medical/Family/Social: see above   Risk Factors   Current exercise habits: -yardwork Dietary issues discussed: has lost weight recently- eating more now  Cardiac risk factors: hyperlipidemia and  Depression Screen  (Note: if answer to either of the following is "Yes", a more complete depression screening is indicated)   Over the past two weeks, have you felt down, depressed or hopeless? No  Over the past two weeks, have you felt little interest or pleasure in doing things? No Have you lost interest or pleasure in daily life? No Do you often feel hopeless? No Do you cry easily over simple problems? No   Activities of Daily Living  In your present state of health, do you have any difficulty performing the following activities?:   Driving? No  Managing money? No  Feeding yourself? No  Getting from bed to chair? No  Climbing a flight of stairs? No  Preparing food and eating?: No  Bathing or showering? No  Getting dressed: No  Getting to the toilet? No  Using the toilet:No  Moving around from place to place: No  In the past year have you fallen or had a near fall?:No  Are you sexually active? No  Do you have more than one partner? No   Hearing Difficulties: No  Do you often ask people to speak up or repeat themselves? No  Do you experience ringing or noises in your ears? No  Do you have difficulty understanding soft or whispered voices? No  Do you feel that you have a problem with memory? No Do you often misplace items? No    Home Safety:  Do you have a smoke alarm at your residence? Yes Do you have grab bars in the bathroom? yes Do you have throw rugs in your house? no   Cognitive Testing  Alert? Yes Normal Appearance?Yes  Oriented to person? Yes Place? Yes  Time? Yes  Recall of three objects? Yes  Can perform simple calculations? Yes  Displays appropriate judgment?Yes  Can read the correct time from a watch face?Yes   List the Names of Other Physician/Practitioners you currently use:  See referral list for the  physicians patient is currently seeing.  Dr. Clelia Croft   Review of Systems:   Objective:     General appearance: Appears stated age. Head: Normocephalic, without obvious abnormality, atraumatic  Eyes: conj clear, EOMi PEERLA  Ears: normal TM's and external ear canals both ears  Nose: Nares normal. Septum midline. Mucosa normal. No drainage or sinus tenderness.  Throat: lips, mucosa, and tongue normal; teeth and gums normal  Neck: no adenopathy, no carotid bruit, no JVD, supple, symmetrical, trachea midline and thyroid not enlarged, symmetric, no tenderness/mass/nodules  No CVA tenderness.  Lungs: clear to auscultation bilaterally  Breasts: normal appearance, no masses or tenderness. Heart: regular rate and rhythm, S1, S2 normal, no murmur, click, rub or gallop  Abdomen: soft, non-tender; bowel sounds normal; no masses, no organomegaly  Musculoskeletal: ROM normal in all joints, no crepitus, no deformity, Normal muscle strengthen. Back  is symmetric, no curvature. Skin: Skin color, texture, turgor normal. No rashes or lesions  Lymph nodes: Cervical, supraclavicular, and axillary nodes normal.  Neurologic: CN 2 -12 Normal, Normal symmetric reflexes. Normal coordination and gait  Psych: Alert & Oriented x 3, Mood appear stable.    Assessment:    Annual wellness medicare exam  Plan:    During the course of the visit the patient was educated and counseled about appropriate screening and preventive services including:  Colonoscopy due next year Flu vaccine up to date      Patient Instructions (the written plan) was given to the patient.  Medicare Attestation  I have personally reviewed:  The patient's medical and social history  Their use of alcohol, tobacco or illicit drugs  Their current medications and supplements  The patient's functional ability including ADLs,fall risks, home safety risks, cognitive, and hearing and visual impairment  Diet and physical activities   Evidence for depression or mood disorders  The patient's weight, height, BMI, and visual acuity have been recorded in the chart. I have made referrals, counseling, and provided education to the patient based on review of the above and I have provided the patient with a written personalized care plan for preventive services.

## 2013-08-21 NOTE — Patient Instructions (Addendum)
Continue same meds and return in one year. Colonoscopy due next year.

## 2013-08-24 ENCOUNTER — Encounter: Payer: Self-pay | Admitting: Internal Medicine

## 2013-09-28 ENCOUNTER — Ambulatory Visit (HOSPITAL_BASED_OUTPATIENT_CLINIC_OR_DEPARTMENT_OTHER): Payer: Medicare Other | Admitting: Oncology

## 2013-09-28 ENCOUNTER — Telehealth: Payer: Self-pay | Admitting: Oncology

## 2013-09-28 ENCOUNTER — Other Ambulatory Visit (HOSPITAL_BASED_OUTPATIENT_CLINIC_OR_DEPARTMENT_OTHER): Payer: Medicare Other

## 2013-09-28 VITALS — BP 129/67 | HR 63 | Temp 97.3°F | Resp 18 | Ht 62.0 in | Wt 125.1 lb

## 2013-09-28 DIAGNOSIS — D47Z9 Other specified neoplasms of uncertain behavior of lymphoid, hematopoietic and related tissue: Secondary | ICD-10-CM

## 2013-09-28 DIAGNOSIS — R634 Abnormal weight loss: Secondary | ICD-10-CM

## 2013-09-28 DIAGNOSIS — D473 Essential (hemorrhagic) thrombocythemia: Secondary | ICD-10-CM

## 2013-09-28 DIAGNOSIS — D75839 Thrombocytosis, unspecified: Secondary | ICD-10-CM

## 2013-09-28 LAB — COMPREHENSIVE METABOLIC PANEL (CC13)
ALK PHOS: 63 U/L (ref 40–150)
ALT: 19 U/L (ref 0–55)
ANION GAP: 11 meq/L (ref 3–11)
AST: 22 U/L (ref 5–34)
Albumin: 4 g/dL (ref 3.5–5.0)
BILIRUBIN TOTAL: 0.51 mg/dL (ref 0.20–1.20)
BUN: 16.3 mg/dL (ref 7.0–26.0)
CO2: 26 meq/L (ref 22–29)
CREATININE: 0.9 mg/dL (ref 0.6–1.1)
Calcium: 9.6 mg/dL (ref 8.4–10.4)
Chloride: 102 mEq/L (ref 98–109)
Glucose: 91 mg/dl (ref 70–140)
Potassium: 4.3 mEq/L (ref 3.5–5.1)
SODIUM: 140 meq/L (ref 136–145)
Total Protein: 7.1 g/dL (ref 6.4–8.3)

## 2013-09-28 LAB — CBC WITH DIFFERENTIAL/PLATELET
BASO%: 0.7 % (ref 0.0–2.0)
Basophils Absolute: 0 10*3/uL (ref 0.0–0.1)
EOS%: 0.8 % (ref 0.0–7.0)
Eosinophils Absolute: 0.1 10*3/uL (ref 0.0–0.5)
HCT: 44.9 % (ref 34.8–46.6)
HGB: 15.2 g/dL (ref 11.6–15.9)
LYMPH%: 28.2 % (ref 14.0–49.7)
MCH: 39.1 pg — ABNORMAL HIGH (ref 25.1–34.0)
MCHC: 33.8 g/dL (ref 31.5–36.0)
MCV: 115.8 fL — AB (ref 79.5–101.0)
MONO#: 0.6 10*3/uL (ref 0.1–0.9)
MONO%: 9.1 % (ref 0.0–14.0)
NEUT#: 4.1 10*3/uL (ref 1.5–6.5)
NEUT%: 61.2 % (ref 38.4–76.8)
Platelets: 533 10*3/uL — ABNORMAL HIGH (ref 145–400)
RBC: 3.88 10*6/uL (ref 3.70–5.45)
RDW: 13.2 % (ref 11.2–14.5)
WBC: 6.8 10*3/uL (ref 3.9–10.3)
lymph#: 1.9 10*3/uL (ref 0.9–3.3)

## 2013-09-28 NOTE — Progress Notes (Signed)
Hematology and Oncology Follow Up Visit  Anna Fuller [id number] [birth year] 67 y.o. 09/28/2013 9:09 AM      Principle Diagnosis: This is a 68 year old female with essential thrombocythemia.  She has JAK2 positive myeloproliferative disorder diagnosed 2007.  Prior Therapy: 1. She is status post anagrelide therapy discontinued due to do intolerance. 2. Status post bone marrow biopsy in August 2008 which showed early signs of myeloproliferative disorder.  Current therapy: She is on hydroxyurea alternating 1000 and 1500 mg every other day, has been treated with hydroxyurea since 2009.   Interim History:  Anna Fuller presents today for a followup visit. She is doing well since her last visit. She is not having any nausea or vomiting. She does report grade 1 fatigue related to her hydroxyurea.  She had not reported any oral ulcers.  Did not report any lesions.  Had not had any major changes in her performance status or activity level.  She has continued to enjoy good quality of life at the time being. No bleeding or thrombotic events noted. No new complications with Hydroxyurea. No new illness or hospitalizations since her last visit. No bleeding or clotting complications. She did report losing weight without decline in her apatite but that has improved and gained 1 pound since the last visit. But she was able to maintain better this time around. She reports some occasional numbness in her middle finger as well as her back of her head when she lies down. She does not report missing any doses of hydroxyurea. She reports of occasional abdominal discomfort and will be getting a colonoscopy this spring.   Medications: I have reviewed the patient's current medications. Current Outpatient Prescriptions  Medication Sig Dispense Refill  . aspirin 81 MG tablet Take 81 mg by mouth daily.        . cholecalciferol (VITAMIN D) 1000 UNITS tablet Take 2,000 Units by mouth daily.      Marland Kitchen estrogens, conjugated,  (PREMARIN) 0.3 MG tablet Take 1 tablet (0.3 mg total) by mouth daily. Take daily for 21 days then do not take for 7 days.  30 tablet  11  . hydroxyurea (HYDREA) 500 MG capsule Take one tab twice a day.  Every other night take an additional tablet at night.  (Alternate 1039m and 15014m.  120 capsule  3  . simvastatin (ZOCOR) 40 MG tablet Take 1 tablet (40 mg total) by mouth at bedtime.  30 tablet  11   No current facility-administered medications for this visit.    Allergies:  Allergies  Allergen Reactions  . Iodine Anaphylaxis    Contrast dye  . Codeine     REACTION: severe nausea/vomiting  . Penicillins     Past Medical History, Surgical history, Social history, and Family History were reviewed and updated.  Review of Systems: Remaining ROS negative. Physical Exam: Blood pressure 129/67, pulse 63, temperature 97.3 F (36.3 C), temperature source Oral, resp. rate 18, height 5' 2" (1.575 m), weight 125 lb 1.6 oz (56.745 kg). ECOG: 1 General appearance: alert Head: Normocephalic, without obvious abnormality, atraumatic Neck: no adenopathy, no carotid bruit, no JVD, supple, symmetrical, trachea midline and thyroid not enlarged, symmetric, no tenderness/mass/nodules Lymph nodes: Cervical, supraclavicular, and axillary nodes normal. Heart:regular rate and rhythm, S1, S2 normal, no murmur, click, rub or gallop Lung:chest clear, no wheezing, rales, normal symmetric air entry, Heart exam - S1, S2 normal, no murmur, no gallop, rate regular Abdomin: soft, non-tender, without masses or organomegaly EXT:no erythema, induration, or nodules  Lab Results: Lab Results  Component Value Date   WBC 6.8 09/28/2013   HGB 15.2 09/28/2013   HCT 44.9 09/28/2013   MCV 115.8* 09/28/2013   PLT 533* 09/28/2013     Chemistry      Component Value Date/Time   NA 138 08/03/2013 0857   NA 143 08/17/2012 0915   K 4.0 08/03/2013 0857   K 4.1 08/17/2012 0915   CL 105 01/30/2013 0825   CL 107 08/17/2012 0915    CO2 24 08/03/2013 0857   CO2 29 08/17/2012 0915   BUN 13.6 08/03/2013 0857   BUN 14 08/17/2012 0915   CREATININE 0.9 08/03/2013 0857   CREATININE 0.79 08/17/2012 0915   CREATININE 0.94 04/11/2012 0839      Component Value Date/Time   CALCIUM 9.8 08/03/2013 0857   CALCIUM 9.2 08/17/2012 0915   ALKPHOS 67 08/03/2013 0857   ALKPHOS 53 08/17/2012 0915   AST 23 08/03/2013 0857   AST 17 08/17/2012 0915   ALT 15 08/03/2013 0857   ALT 11 08/17/2012 0915   BILITOT 0.61 08/03/2013 0857   BILITOT 0.4 08/17/2012 0915      Impression and Plan:  This is a pleasant 67-year-old female with the following issues. 1. Myeloproliferative disorder presenting with essential thrombocythemia.  She is currently on hydroxyurea alternating between 1000 and 1500 mg.  Platelet count is slightly higher today but still under excellent control..  The plan is to continue on the current dose and regimen and continue to monitor every 2 months. I continued to discuss with her the effect of long term use of hydroxyurea. She wants to continue with the treatment for now.  2. Thrombosis prophylaxis.  Continue to be on aspirin.   3. Weight loss: She gained 1 pound since the last visit, and will be getting a colonoscopy the spring.     SHADAD,FIRAS, MD 1/2/20159:09 AM 

## 2013-09-28 NOTE — Telephone Encounter (Signed)
gv pt appt schedule for March 2015.  °

## 2013-10-05 ENCOUNTER — Encounter: Payer: Self-pay | Admitting: Oncology

## 2013-11-28 ENCOUNTER — Telehealth: Payer: Self-pay | Admitting: Oncology

## 2013-11-28 ENCOUNTER — Ambulatory Visit (HOSPITAL_BASED_OUTPATIENT_CLINIC_OR_DEPARTMENT_OTHER): Payer: Medicare Other | Admitting: Oncology

## 2013-11-28 ENCOUNTER — Other Ambulatory Visit (HOSPITAL_BASED_OUTPATIENT_CLINIC_OR_DEPARTMENT_OTHER): Payer: Medicare Other

## 2013-11-28 ENCOUNTER — Encounter: Payer: Self-pay | Admitting: Oncology

## 2013-11-28 VITALS — BP 116/47 | HR 66 | Temp 97.2°F | Resp 18 | Ht 62.0 in | Wt 131.1 lb

## 2013-11-28 DIAGNOSIS — D473 Essential (hemorrhagic) thrombocythemia: Secondary | ICD-10-CM | POA: Diagnosis not present

## 2013-11-28 DIAGNOSIS — D75839 Thrombocytosis, unspecified: Secondary | ICD-10-CM

## 2013-11-28 LAB — CBC WITH DIFFERENTIAL/PLATELET
BASO%: 0.8 % (ref 0.0–2.0)
BASOS ABS: 0 10*3/uL (ref 0.0–0.1)
EOS%: 1 % (ref 0.0–7.0)
Eosinophils Absolute: 0.1 10*3/uL (ref 0.0–0.5)
HEMATOCRIT: 43.4 % (ref 34.8–46.6)
HEMOGLOBIN: 14.7 g/dL (ref 11.6–15.9)
LYMPH%: 26.7 % (ref 14.0–49.7)
MCH: 39.1 pg — AB (ref 25.1–34.0)
MCHC: 33.9 g/dL (ref 31.5–36.0)
MCV: 115.5 fL — ABNORMAL HIGH (ref 79.5–101.0)
MONO#: 0.5 10*3/uL (ref 0.1–0.9)
MONO%: 9.6 % (ref 0.0–14.0)
NEUT#: 3.5 10*3/uL (ref 1.5–6.5)
NEUT%: 61.9 % (ref 38.4–76.8)
PLATELETS: 492 10*3/uL — AB (ref 145–400)
RBC: 3.76 10*6/uL (ref 3.70–5.45)
RDW: 13.2 % (ref 11.2–14.5)
WBC: 5.7 10*3/uL (ref 3.9–10.3)
lymph#: 1.5 10*3/uL (ref 0.9–3.3)

## 2013-11-28 LAB — COMPREHENSIVE METABOLIC PANEL (CC13)
ALT: 22 U/L (ref 0–55)
ANION GAP: 9 meq/L (ref 3–11)
AST: 27 U/L (ref 5–34)
Albumin: 3.9 g/dL (ref 3.5–5.0)
Alkaline Phosphatase: 63 U/L (ref 40–150)
BILIRUBIN TOTAL: 0.38 mg/dL (ref 0.20–1.20)
BUN: 15.7 mg/dL (ref 7.0–26.0)
CO2: 27 mEq/L (ref 22–29)
Calcium: 9.4 mg/dL (ref 8.4–10.4)
Chloride: 106 mEq/L (ref 98–109)
Creatinine: 0.9 mg/dL (ref 0.6–1.1)
Glucose: 93 mg/dl (ref 70–140)
Potassium: 4.1 mEq/L (ref 3.5–5.1)
Sodium: 142 mEq/L (ref 136–145)
Total Protein: 6.8 g/dL (ref 6.4–8.3)

## 2013-11-28 MED ORDER — HYDROXYUREA 500 MG PO CAPS: ORAL_CAPSULE | ORAL | Status: DC

## 2013-11-28 NOTE — Progress Notes (Signed)
Hematology and Oncology Follow Up Visit  Anna Fuller [id number] [birth year] 68 y.o. 11/28/2013 8:24 AM      Principle Diagnosis: This is a 68 year old female with essential thrombocythemia.  She has JAK2 positive myeloproliferative disorder diagnosed 2007.  Prior Therapy: 1. She is status post anagrelide therapy discontinued due to do intolerance. 2. Status post bone marrow biopsy in August 2008 which showed early signs of myeloproliferative disorder.  Current therapy: She is on hydroxyurea alternating 1000 and 1500 mg every other day, has been treated with hydroxyurea since 2009.   Interim History:  Ms. Jollie presents today for a followup visit. She has not reported any new issues since the last visit. She does report grade 1 fatigue related to her hydroxyurea.  She had not reported any oral ulcers.  Did not report any lesions.  Had not had any major changes in her performance status or activity level.  She has continued to enjoy good quality of life at the time being. No bleeding or thrombotic events noted. No new complications with Hydroxyurea. No new illness or hospitalizations since her last visit. No bleeding or clotting complications. She still have some issues with sleep at times but for the most part new major changes in her clinical status. She continues to be adherent to hydroxyurea at this time.   Medications: I have reviewed the patient's current medications. Current Outpatient Prescriptions  Medication Sig Dispense Refill  . aspirin 81 MG tablet Take 81 mg by mouth daily.        . cholecalciferol (VITAMIN D) 1000 UNITS tablet Take 2,000 Units by mouth daily.      Marland Kitchen estrogens, conjugated, (PREMARIN) 0.3 MG tablet Take 1 tablet (0.3 mg total) by mouth daily. Take daily for 21 days then do not take for 7 days.  30 tablet  11  . hydroxyurea (HYDREA) 500 MG capsule Take one tab twice a day.  Every other night take an additional tablet at night.  (Alternate 1083m and 15054m.   120 capsule  3  . simvastatin (ZOCOR) 40 MG tablet Take 1 tablet (40 mg total) by mouth at bedtime.  30 tablet  11       Allergies:  Allergies  Allergen Reactions  . Iodine Anaphylaxis    Contrast dye  . Codeine     REACTION: severe nausea/vomiting  . Penicillins     Past Medical History, Surgical history, Social history, and Family History were reviewed and updated.  Review of Systems: Remaining ROS negative. Physical Exam: Blood pressure 116/47, pulse 66, temperature 97.2 F (36.2 C), temperature source Oral, resp. rate 18, height 5' 2"  (1.575 m), weight 131 lb 1.6 oz (59.467 kg), SpO2 99.00%. ECOG: 1 General appearance: alert oriented and appears in no active distress. Head: Normocephalic, without obvious abnormality, atraumatic no abnormalities noted. Neck: no adenopathy, no carotid bruit, no JVD, supple. Lymph nodes: Cervical, supraclavicular, and axillary nodes normal. Heart:regular rate and rhythm, S1, S2 normal, no murmur, click, rub or gallop Lung:chest clear, no wheezing, rales.  Abdomin: soft, non-tender, without masses or organomegaly EXT:no erythema, induration, or nodules Neurological examination: Showed no deficits she has normal gait.  Lab Results: Lab Results  Component Value Date   WBC 5.7 11/28/2013   HGB 14.7 11/28/2013   HCT 43.4 11/28/2013   MCV 115.5* 11/28/2013   PLT 492* 11/28/2013     Chemistry      Component Value Date/Time   NA 140 09/28/2013 0829   NA 143 08/17/2012 0915  K 4.3 09/28/2013 0829   K 4.1 08/17/2012 0915   CL 105 01/30/2013 0825   CL 107 08/17/2012 0915   CO2 26 09/28/2013 0829   CO2 29 08/17/2012 0915   BUN 16.3 09/28/2013 0829   BUN 14 08/17/2012 0915   CREATININE 0.9 09/28/2013 0829   CREATININE 0.79 08/17/2012 0915   CREATININE 0.94 04/11/2012 0839      Component Value Date/Time   CALCIUM 9.6 09/28/2013 0829   CALCIUM 9.2 08/17/2012 0915   ALKPHOS 63 09/28/2013 0829   ALKPHOS 53 08/17/2012 0915   AST 22 09/28/2013 0829   AST 17  08/17/2012 0915   ALT 19 09/28/2013 0829   ALT 11 08/17/2012 0915   BILITOT 0.51 09/28/2013 0829   BILITOT 0.4 08/17/2012 0915      Impression and Plan:  This is a pleasant 68 year old female with the following issues. 1. Myeloproliferative disorder presenting with essential thrombocythemia.  She is currently on hydroxyurea alternating between 1000 and 1500 mg.  Platelet count is still under excellent control. The laboratory values were discussed with her today indicating a platelet count of 492.  The plan is to continue on the current dose and regimen and continue to monitor every 2 months. I continued to discuss with her the effect of long term use of hydroxyurea. She wants to continue with the treatment for now.  2. Thrombosis prophylaxis.  Continue to be on aspirin.   3. Weight loss: He seems to be rather stable at this time. 4. Followup: In 2 months.     [facility], MD 3/4/20158:24 AM

## 2013-11-28 NOTE — Telephone Encounter (Signed)
gv and printed appt sched and avs for pt for May...the patient did not want print out

## 2014-01-16 DIAGNOSIS — H43819 Vitreous degeneration, unspecified eye: Secondary | ICD-10-CM | POA: Diagnosis not present

## 2014-01-16 DIAGNOSIS — H5210 Myopia, unspecified eye: Secondary | ICD-10-CM | POA: Diagnosis not present

## 2014-01-16 DIAGNOSIS — H524 Presbyopia: Secondary | ICD-10-CM | POA: Diagnosis not present

## 2014-02-05 ENCOUNTER — Other Ambulatory Visit (HOSPITAL_BASED_OUTPATIENT_CLINIC_OR_DEPARTMENT_OTHER): Payer: Medicare Other

## 2014-02-05 ENCOUNTER — Encounter: Payer: Self-pay | Admitting: Oncology

## 2014-02-05 ENCOUNTER — Telehealth: Payer: Self-pay | Admitting: Oncology

## 2014-02-05 ENCOUNTER — Ambulatory Visit (HOSPITAL_BASED_OUTPATIENT_CLINIC_OR_DEPARTMENT_OTHER): Payer: Medicare Other | Admitting: Oncology

## 2014-02-05 VITALS — BP 113/47 | HR 64 | Temp 97.5°F | Resp 18 | Ht 62.0 in | Wt 132.2 lb

## 2014-02-05 DIAGNOSIS — R61 Generalized hyperhidrosis: Secondary | ICD-10-CM | POA: Diagnosis not present

## 2014-02-05 DIAGNOSIS — D473 Essential (hemorrhagic) thrombocythemia: Secondary | ICD-10-CM | POA: Diagnosis not present

## 2014-02-05 DIAGNOSIS — D75839 Thrombocytosis, unspecified: Secondary | ICD-10-CM

## 2014-02-05 DIAGNOSIS — Z7982 Long term (current) use of aspirin: Secondary | ICD-10-CM | POA: Diagnosis not present

## 2014-02-05 LAB — CBC WITH DIFFERENTIAL/PLATELET
BASO%: 0.6 % (ref 0.0–2.0)
BASOS ABS: 0 10*3/uL (ref 0.0–0.1)
EOS%: 1.5 % (ref 0.0–7.0)
Eosinophils Absolute: 0.1 10*3/uL (ref 0.0–0.5)
HEMATOCRIT: 39.8 % (ref 34.8–46.6)
HGB: 13.6 g/dL (ref 11.6–15.9)
LYMPH#: 1.7 10*3/uL (ref 0.9–3.3)
LYMPH%: 31.6 % (ref 14.0–49.7)
MCH: 38.1 pg — AB (ref 25.1–34.0)
MCHC: 34.2 g/dL (ref 31.5–36.0)
MCV: 111.5 fL — ABNORMAL HIGH (ref 79.5–101.0)
MONO#: 0.4 10*3/uL (ref 0.1–0.9)
MONO%: 8.3 % (ref 0.0–14.0)
NEUT#: 3.1 10*3/uL (ref 1.5–6.5)
NEUT%: 58 % (ref 38.4–76.8)
Platelets: 417 10*3/uL — ABNORMAL HIGH (ref 145–400)
RBC: 3.57 10*6/uL — ABNORMAL LOW (ref 3.70–5.45)
RDW: 13.1 % (ref 11.2–14.5)
WBC: 5.3 10*3/uL (ref 3.9–10.3)

## 2014-02-05 LAB — COMPREHENSIVE METABOLIC PANEL (CC13)
ALBUMIN: 3.9 g/dL (ref 3.5–5.0)
ALT: 17 U/L (ref 0–55)
ANION GAP: 13 meq/L — AB (ref 3–11)
AST: 24 U/L (ref 5–34)
Alkaline Phosphatase: 56 U/L (ref 40–150)
BUN: 17.2 mg/dL (ref 7.0–26.0)
CHLORIDE: 105 meq/L (ref 98–109)
CO2: 22 meq/L (ref 22–29)
CREATININE: 1 mg/dL (ref 0.6–1.1)
Calcium: 9.5 mg/dL (ref 8.4–10.4)
Glucose: 83 mg/dl (ref 70–140)
POTASSIUM: 3.9 meq/L (ref 3.5–5.1)
Sodium: 140 mEq/L (ref 136–145)
Total Bilirubin: 0.49 mg/dL (ref 0.20–1.20)
Total Protein: 6.4 g/dL (ref 6.4–8.3)

## 2014-02-05 NOTE — Progress Notes (Signed)
Hematology and Oncology Follow Up Visit  Anna Fuller [id number] 05/24/1946 68 y.o. 02/05/2014 8:40 AM      Principle Diagnosis: 68 year old female with essential thrombocythemia.  She has JAK2 positive myeloproliferative disorder diagnosed 2007.  Prior Therapy: 1. She is status post anagrelide therapy discontinued due to do intolerance. 2. Status post bone marrow biopsy in August 2008 which showed early signs of myeloproliferative disorder.  Current therapy: She is on hydroxyurea alternating 1000 and 1500 mg every other day, has been treated with hydroxyurea since 2009.   Interim History:  Anna Fuller presents today for a followup visit.  She does report since the last visit occasional night sweats which have not been consistent. She is not reporting any fevers or chills. She has reported no lymphadenopathy. No persistent daytime sweats or weight loss. She does report occasional  fatigue related to her hydroxyurea.  She had not reported any oral ulcers.  Did not report any lesions.  Had not had any major changes in her performance status or activity level.  She has continued to enjoy good quality of life at the time being. She has planted her spring garden without any difficulty. She did have flu like symptoms which have subsequently resolved at this time. She is not reporting no bleeding or thrombotic events noted. No new complications with Hydroxyurea. No bleeding or clotting complications. She continues to be compliant with her medication at this time. She has not reported any headaches or blurry vision or double vision. Has not reported any chest pain or difficulty breathing. Has not reported any nausea or vomiting. Her rest of her review of system is unremarkable.  Medications: I have reviewed the patient's current medications. Current Outpatient Prescriptions  Medication Sig Dispense Refill  . aspirin 81 MG tablet Take 81 mg by mouth daily.        . cholecalciferol (VITAMIN D) 1000  UNITS tablet Take 2,000 Units by mouth daily.      Marland Kitchen estrogens, conjugated, (PREMARIN) 0.3 MG tablet Take 1 tablet (0.3 mg total) by mouth daily. Take daily for 21 days then do not take for 7 days.  30 tablet  11  . hydroxyurea (HYDREA) 500 MG capsule Take one tab twice a day.  Every other night take an additional tablet at night.  (Alternate 1053m and 15021m.  120 capsule  3  . simvastatin (ZOCOR) 40 MG tablet Take 1 tablet (40 mg total) by mouth at bedtime.  30 tablet  11       Allergies: Reviewed with the patient again today. Allergies  Allergen Reactions  . Iodine Anaphylaxis    Contrast dye  . Codeine     REACTION: severe nausea/vomiting  . Penicillins         Physical Exam:  Blood pressure 113/47, pulse 64, temperature 97.5 F (36.4 C), temperature source Oral, resp. rate 18, height _0  (1.575 m), weight 132 lb 3.2 oz (59.966 kg), SpO2 98.00%. ECOG: 1 General appearance: alert oriented and appears in no active distress. He appears younger than her stated age. Head: Normocephalic, without obvious abnormality, atraumatic no abnormalities noted. No masses or lesions. Neck: no adenopathy, no carotid bruit, no JVD, supple. No thyromegaly. Lymph nodes: Cervical, supraclavicular, and axillary nodes normal. could not appreciate any lymphadenopathy in any other area. Heart:regular rate and rhythm, S1, S2 normal.  Lung:chest clear, no wheezing, rales. No dullness to percussion. Abdomin: soft, non-tender, without masses or organomegaly EXT:no erythema, induration, or nodules Neurological examination: Showed no deficits she  has normal gait. Skin examination: No rashes or lesions.  Lab Results: Lab Results  Component Value Date   WBC 5.3 02/05/2014   HGB 13.6 02/05/2014   HCT 39.8 02/05/2014   MCV 111.5* 02/05/2014   PLT 417* 02/05/2014     Chemistry      Component Value Date/Time   NA 142 11/28/2013 0750   NA 143 08/17/2012 0915   K 4.1 11/28/2013 0750   K 4.1 08/17/2012  0915   CL 105 01/30/2013 0825   CL 107 08/17/2012 0915   CO2 27 11/28/2013 0750   CO2 29 08/17/2012 0915   BUN 15.7 11/28/2013 0750   BUN 14 08/17/2012 0915   CREATININE 0.9 11/28/2013 0750   CREATININE 0.79 08/17/2012 0915   CREATININE 0.94 04/11/2012 0839      Component Value Date/Time   CALCIUM 9.4 11/28/2013 0750   CALCIUM 9.2 08/17/2012 0915   ALKPHOS 63 11/28/2013 0750   ALKPHOS 53 08/17/2012 0915   AST 27 11/28/2013 0750   AST 17 08/17/2012 0915   ALT 22 11/28/2013 0750   ALT 11 08/17/2012 0915   BILITOT 0.38 11/28/2013 0750   BILITOT 0.4 08/17/2012 0915      Impression and Plan:  This is a pleasant 68 year old female with the following issues.  1. Myeloproliferative disorder presenting with essential thrombocythemia.  She is currently on hydroxyurea alternating between 1000 and 1500 mg.  Platelet count today is 417 which is  still under control. The laboratory values were discussed with her in detail.  The plan is to continue on the current dose and regimen and continue to monitor every 2 months. I continued to discuss with her the effect of long term use of hydroxyurea. She wants to continue with the treatment for now.    2. Thrombosis prophylaxis.  Continue to be on aspirin.     3. Night sweats: Unclear etiology at this time there is no signs of symptoms to suggest a lymphoproliferative disorder. We will continue to monitor this closely and certainly will consider imaging studies and back problem persists or there is any other signs or symptoms of lymphoproliferative disorder.   4. Followup: In 2 months.     Wyatt Portela, MD 5/12/20158:40 AM

## 2014-02-05 NOTE — Telephone Encounter (Signed)
Gave pt appt for lab and MD on July 2015 °

## 2014-04-09 ENCOUNTER — Other Ambulatory Visit (HOSPITAL_BASED_OUTPATIENT_CLINIC_OR_DEPARTMENT_OTHER): Payer: Medicare Other

## 2014-04-09 ENCOUNTER — Encounter: Payer: Self-pay | Admitting: Oncology

## 2014-04-09 ENCOUNTER — Ambulatory Visit (HOSPITAL_BASED_OUTPATIENT_CLINIC_OR_DEPARTMENT_OTHER): Payer: Medicare Other | Admitting: Oncology

## 2014-04-09 ENCOUNTER — Telehealth: Payer: Self-pay | Admitting: Oncology

## 2014-04-09 VITALS — BP 113/52 | HR 67 | Temp 97.9°F | Resp 18 | Ht 62.0 in | Wt 134.4 lb

## 2014-04-09 DIAGNOSIS — D473 Essential (hemorrhagic) thrombocythemia: Secondary | ICD-10-CM

## 2014-04-09 DIAGNOSIS — D47Z9 Other specified neoplasms of uncertain behavior of lymphoid, hematopoietic and related tissue: Secondary | ICD-10-CM | POA: Diagnosis not present

## 2014-04-09 DIAGNOSIS — D75839 Thrombocytosis, unspecified: Secondary | ICD-10-CM

## 2014-04-09 LAB — COMPREHENSIVE METABOLIC PANEL (CC13)
ALT: 20 U/L (ref 0–55)
ANION GAP: 8 meq/L (ref 3–11)
AST: 24 U/L (ref 5–34)
Albumin: 3.9 g/dL (ref 3.5–5.0)
Alkaline Phosphatase: 63 U/L (ref 40–150)
BUN: 14.2 mg/dL (ref 7.0–26.0)
CALCIUM: 9.3 mg/dL (ref 8.4–10.4)
CO2: 28 meq/L (ref 22–29)
CREATININE: 0.9 mg/dL (ref 0.6–1.1)
Chloride: 105 mEq/L (ref 98–109)
GLUCOSE: 91 mg/dL (ref 70–140)
Potassium: 3.8 mEq/L (ref 3.5–5.1)
Sodium: 141 mEq/L (ref 136–145)
Total Bilirubin: 0.43 mg/dL (ref 0.20–1.20)
Total Protein: 6.7 g/dL (ref 6.4–8.3)

## 2014-04-09 LAB — CBC WITH DIFFERENTIAL/PLATELET
BASO%: 0.7 % (ref 0.0–2.0)
Basophils Absolute: 0.1 10*3/uL (ref 0.0–0.1)
EOS ABS: 0.1 10*3/uL (ref 0.0–0.5)
EOS%: 1.5 % (ref 0.0–7.0)
HEMATOCRIT: 43.2 % (ref 34.8–46.6)
HEMOGLOBIN: 14.6 g/dL (ref 11.6–15.9)
LYMPH#: 1.8 10*3/uL (ref 0.9–3.3)
LYMPH%: 25.8 % (ref 14.0–49.7)
MCH: 37.1 pg — ABNORMAL HIGH (ref 25.1–34.0)
MCHC: 33.8 g/dL (ref 31.5–36.0)
MCV: 109.6 fL — ABNORMAL HIGH (ref 79.5–101.0)
MONO#: 0.7 10*3/uL (ref 0.1–0.9)
MONO%: 10 % (ref 0.0–14.0)
NEUT%: 62 % (ref 38.4–76.8)
NEUTROS ABS: 4.3 10*3/uL (ref 1.5–6.5)
PLATELETS: 551 10*3/uL — AB (ref 145–400)
RBC: 3.94 10*6/uL (ref 3.70–5.45)
RDW: 13.5 % (ref 11.2–14.5)
WBC: 6.9 10*3/uL (ref 3.9–10.3)

## 2014-04-09 NOTE — Telephone Encounter (Signed)
gv adn pritned appt sched and avs for optf or Sept

## 2014-04-09 NOTE — Progress Notes (Signed)
Hematology and Oncology Follow Up Visit  Anna Fuller [id number] May 10, 1946 68 y.o. 04/09/2014 9:01 AM      Principle Diagnosis: 68 year old female with essential thrombocythemia.  She has JAK2 positive myeloproliferative disorder diagnosed 2007.  Prior Therapy: 1. She is status post anagrelide therapy discontinued due to do intolerance. 2. Status post bone marrow biopsy in August 2008 which showed early signs of myeloproliferative disorder.  Current therapy: She is on hydroxyurea alternating 1000 and 1500 mg every other day, has been treated with hydroxyurea since 2009.   Interim History:  Anna Fuller presents today for a followup visit I hurt self.  She has been doing well since the last visit. She continues to be active and has not reported any decline in her energy or performance status. She has not reported any hospitalization or illnesses. She is not reporting any fevers or chills. She has reported no lymphadenopathy. No persistent daytime sweats or weight loss. She does report occasional  fatigue related to her hydroxyurea.  She had not reported any oral ulcers.  Did not report any lesions. She has continued to enjoy good quality of life at the time being. She is not reporting no bleeding or thrombotic events noted. No new complications with Hydroxyurea. No bleeding or clotting complications. She continues to be compliant with her medication at this time. She has not reported any headaches or blurry vision or double vision. Has not reported any chest pain or difficulty breathing. Has not reported any nausea or vomiting. Her rest of her review of system is unremarkable.  Medications: I have reviewed the patient's current medications. Current Outpatient Prescriptions  Medication Sig Dispense Refill  . aspirin 81 MG tablet Take 81 mg by mouth daily.        . cholecalciferol (VITAMIN D) 1000 UNITS tablet Take 2,000 Units by mouth daily.      Marland Kitchen estrogens, conjugated, (PREMARIN) 0.3 MG  tablet Take 1 tablet (0.3 mg total) by mouth daily. Take daily for 21 days then do not take for 7 days.  30 tablet  11  . hydroxyurea (HYDREA) 500 MG capsule Take one tab twice a day.  Every other night take an additional tablet at night.  (Alternate 1074m and 15031m.  120 capsule  3  . simvastatin (ZOCOR) 40 MG tablet Take 1 tablet (40 mg total) by mouth at bedtime.  30 tablet  11       Allergies: Reviewed with the patient again today. Allergies  Allergen Reactions  . Iodine Anaphylaxis    Contrast dye  . Codeine     REACTION: severe nausea/vomiting  . Penicillins         Physical Exam:  Blood pressure 113/52, pulse 67, temperature 97.9 F (36.6 C), temperature source Oral, resp. rate 18, height _0  (1.575 m), weight 134 lb 6.4 oz (60.963 kg), SpO2 98.00%. ECOG: 1 General appearance: alert oriented. He appears younger than her stated age. Head: Normocephalic, without obvious abnormality. Neck: no adenopathy, no carotid bruit, no JVD, supple. No thyromegaly. Lymph nodes: Cervical, supraclavicular, and axillary nodes normal.  Heart:regular rate and rhythm, S1, S2 normal.  Lung:chest clear, no wheezing, rales. No dullness to percussion. Abdomin: soft, non-tender, without masses or organomegaly EXT:no erythema, induration, or nodules Neurological examination: Showed no deficits she has normal gait. Skin examination: No rashes or lesions.  Lab Results: Lab Results  Component Value Date   WBC 6.9 04/09/2014   HGB 14.6 04/09/2014   HCT 43.2 04/09/2014   MCV 109.6*  04/09/2014   PLT 551* 04/09/2014     Chemistry      Component Value Date/Time   NA 140 02/05/2014 0818   NA 143 08/17/2012 0915   K 3.9 02/05/2014 0818   K 4.1 08/17/2012 0915   CL 105 01/30/2013 0825   CL 107 08/17/2012 0915   CO2 22 02/05/2014 0818   CO2 29 08/17/2012 0915   BUN 17.2 02/05/2014 0818   BUN 14 08/17/2012 0915   CREATININE 1.0 02/05/2014 0818   CREATININE 0.79 08/17/2012 0915   CREATININE 0.94  04/11/2012 0839      Component Value Date/Time   CALCIUM 9.5 02/05/2014 0818   CALCIUM 9.2 08/17/2012 0915   ALKPHOS 56 02/05/2014 0818   ALKPHOS 53 08/17/2012 0915   AST 24 02/05/2014 0818   AST 17 08/17/2012 0915   ALT 17 02/05/2014 0818   ALT 11 08/17/2012 0915   BILITOT 0.49 02/05/2014 0818   BILITOT 0.4 08/17/2012 0915      Impression and Plan:  This is a pleasant 68 year old female with the following issues.  1. Myeloproliferative disorder presenting with essential thrombocythemia.  She is currently on hydroxyurea alternating between 1000 and 1500 mg.  Platelet count today is 551 which remians under control. The laboratory values were discussed with her in detail.  The plan is to continue on the current dose and regimen and continue to monitor every 2 months. I continued to discuss with her the effect of long term use of hydroxyurea. She wants to continue with the treatment for now.    2. Thrombosis prophylaxis.  Continue to be on aspirin.     3. Night sweats: resolved at this time.   4. Followup: In 2 months.     [facility], MD 7/14/20159:01 AM

## 2014-05-21 DIAGNOSIS — Z23 Encounter for immunization: Secondary | ICD-10-CM | POA: Diagnosis not present

## 2014-05-22 ENCOUNTER — Encounter: Payer: Self-pay | Admitting: *Deleted

## 2014-06-05 ENCOUNTER — Encounter: Payer: Self-pay | Admitting: Oncology

## 2014-06-05 ENCOUNTER — Ambulatory Visit (HOSPITAL_BASED_OUTPATIENT_CLINIC_OR_DEPARTMENT_OTHER): Payer: Medicare Other | Admitting: Oncology

## 2014-06-05 ENCOUNTER — Telehealth: Payer: Self-pay | Admitting: Oncology

## 2014-06-05 ENCOUNTER — Other Ambulatory Visit (HOSPITAL_BASED_OUTPATIENT_CLINIC_OR_DEPARTMENT_OTHER): Payer: Medicare Other

## 2014-06-05 VITALS — BP 129/46 | HR 64 | Temp 98.2°F | Resp 18 | Ht 62.0 in | Wt 135.8 lb

## 2014-06-05 DIAGNOSIS — D75839 Thrombocytosis, unspecified: Secondary | ICD-10-CM

## 2014-06-05 DIAGNOSIS — D473 Essential (hemorrhagic) thrombocythemia: Secondary | ICD-10-CM

## 2014-06-05 DIAGNOSIS — Z7982 Long term (current) use of aspirin: Secondary | ICD-10-CM

## 2014-06-05 LAB — CBC WITH DIFFERENTIAL/PLATELET
BASO%: 0.7 % (ref 0.0–2.0)
Basophils Absolute: 0.1 10*3/uL (ref 0.0–0.1)
EOS%: 1.9 % (ref 0.0–7.0)
Eosinophils Absolute: 0.1 10*3/uL (ref 0.0–0.5)
HCT: 44.7 % (ref 34.8–46.6)
HGB: 15.1 g/dL (ref 11.6–15.9)
LYMPH%: 27.3 % (ref 14.0–49.7)
MCH: 36.9 pg — AB (ref 25.1–34.0)
MCHC: 33.8 g/dL (ref 31.5–36.0)
MCV: 109.3 fL — ABNORMAL HIGH (ref 79.5–101.0)
MONO#: 0.7 10*3/uL (ref 0.1–0.9)
MONO%: 10.6 % (ref 0.0–14.0)
NEUT#: 4.1 10*3/uL (ref 1.5–6.5)
NEUT%: 59.5 % (ref 38.4–76.8)
Platelets: 482 10*3/uL — ABNORMAL HIGH (ref 145–400)
RBC: 4.09 10*6/uL (ref 3.70–5.45)
RDW: 13.6 % (ref 11.2–14.5)
WBC: 6.9 10*3/uL (ref 3.9–10.3)
lymph#: 1.9 10*3/uL (ref 0.9–3.3)

## 2014-06-05 LAB — COMPREHENSIVE METABOLIC PANEL (CC13)
ALBUMIN: 3.8 g/dL (ref 3.5–5.0)
ALT: 16 U/L (ref 0–55)
AST: 23 U/L (ref 5–34)
Alkaline Phosphatase: 64 U/L (ref 40–150)
Anion Gap: 10 mEq/L (ref 3–11)
BUN: 10.4 mg/dL (ref 7.0–26.0)
CALCIUM: 9.1 mg/dL (ref 8.4–10.4)
CHLORIDE: 104 meq/L (ref 98–109)
CO2: 24 mEq/L (ref 22–29)
Creatinine: 0.9 mg/dL (ref 0.6–1.1)
Glucose: 90 mg/dl (ref 70–140)
Potassium: 3.9 mEq/L (ref 3.5–5.1)
SODIUM: 139 meq/L (ref 136–145)
TOTAL PROTEIN: 6.7 g/dL (ref 6.4–8.3)
Total Bilirubin: 0.42 mg/dL (ref 0.20–1.20)

## 2014-06-05 MED ORDER — HYDROXYUREA 500 MG PO CAPS: ORAL_CAPSULE | ORAL | Status: DC

## 2014-06-05 NOTE — Progress Notes (Signed)
Hematology and Oncology Follow Up Visit  Anna Fuller [id number] [birth year] 68 y.o. 06/05/2014 8:54 AM      Principle Diagnosis: 68 year old female with essential thrombocythemia.  She has JAK2 positive myeloproliferative disorder diagnosed 2007.  Prior Therapy: 1. She is status post anagrelide therapy discontinued due to do intolerance. 2. Status post bone marrow biopsy in August 2008 which showed early signs of myeloproliferative disorder.  Current therapy: She is on hydroxyurea alternating 1000 and 1500 mg every other day, has been treated with hydroxyurea since 2009.   Interim History:  Anna Fuller presents today for a followup visit I hurt self.  She has been doing well since the last visit. She has reported some slight numbing sensation on her scalp after laying down for extended period of time. No other neurological symptoms. Mediport any headaches or blurry vision. No report any syncope or seizures. She continues to be active and has not reported any decline in her energy or performance status. She has not reported any hospitalization or illnesses. She is not reporting any fevers or chills. She has reported no lymphadenopathy. No persistent daytime sweats or weight loss. She does report occasional  fatigue related to her hydroxyurea.  She had not reported any oral ulcers.  Did not report any lesions. She has continued to enjoy good quality of life at the time being. She is not reporting no bleeding or thrombotic events noted. No new complications with Hydroxyurea. No bleeding or clotting complications. She continues to be compliant with her medication at this time. Has not reported any chest pain or difficulty breathing. Has not reported any nausea or vomiting. Her rest of her review of system is unremarkable.  Medications: I have reviewed the patient's current medications. Current Outpatient Prescriptions  Medication Sig Dispense Refill  . aspirin 81 MG tablet Take 81 mg by mouth  daily.        . cholecalciferol (VITAMIN D) 1000 UNITS tablet Take 2,000 Units by mouth daily.      Marland Kitchen estrogens, conjugated, (PREMARIN) 0.3 MG tablet Take 1 tablet (0.3 mg total) by mouth daily. Take daily for 21 days then do not take for 7 days.  30 tablet  11  . hydroxyurea (HYDREA) 500 MG capsule Take one tab twice a day.  Every other night take an additional tablet at night.  (Alternate 1074m and 15069m.  120 capsule  3  . simvastatin (ZOCOR) 40 MG tablet Take 1 tablet (40 mg total) by mouth at bedtime.  30 tablet  11       Allergies: Reviewed with the patient again today. Allergies  Allergen Reactions  . Iodine Anaphylaxis    Contrast dye  . Codeine     REACTION: severe nausea/vomiting  . Penicillins         Physical Exam:  Blood pressure 129/46, pulse 64, temperature 98.2 F (36.8 C), temperature source Oral, resp. rate 18, height 5' 2"  (1.575 m), weight 135 lb 12.8 oz (61.598 kg). ECOG: 1 General appearance: alert oriented. He appears younger than her stated age. Head: Normocephalic, without obvious abnormality. Neck: no adenopathy, no carotid bruit, no JVD, supple. No thyromegaly. Lymph nodes: Cervical, supraclavicular, and axillary nodes normal.  Heart:regular rate and rhythm, S1, S2 normal.  Lung:chest clear, no wheezing, rales. No dullness to percussion. Abdomin: soft, non-tender, without masses or organomegaly EXT:no erythema, induration, or nodules Neurological examination: Showed no deficits she has normal gait. Skin examination: No rashes or lesions.  Lab Results: Lab Results  Component Value  Date   WBC 6.9 06/05/2014   HGB 15.1 06/05/2014   HCT 44.7 06/05/2014   MCV 109.3* 06/05/2014   PLT 482* 06/05/2014     Chemistry      Component Value Date/Time   NA 141 04/09/2014 0829   NA 143 08/17/2012 0915   K 3.8 04/09/2014 0829   K 4.1 08/17/2012 0915   CL 105 01/30/2013 0825   CL 107 08/17/2012 0915   CO2 28 04/09/2014 0829   CO2 29 08/17/2012 0915   BUN 14.2  04/09/2014 0829   BUN 14 08/17/2012 0915   CREATININE 0.9 04/09/2014 0829   CREATININE 0.79 08/17/2012 0915   CREATININE 0.94 04/11/2012 0839      Component Value Date/Time   CALCIUM 9.3 04/09/2014 0829   CALCIUM 9.2 08/17/2012 0915   ALKPHOS 63 04/09/2014 0829   ALKPHOS 53 08/17/2012 0915   AST 24 04/09/2014 0829   AST 17 08/17/2012 0915   ALT 20 04/09/2014 0829   ALT 11 08/17/2012 0915   BILITOT 0.43 04/09/2014 0829   BILITOT 0.4 08/17/2012 0915      Impression and Plan:  This is a pleasant 68 year old female with the following issues.  1. Myeloproliferative disorder presenting with essential thrombocythemia.  She is currently on hydroxyurea alternating between 1000 and 1500 mg.  Platelet count today is 482 which remians under control. The laboratory values were discussed with her in detail.  The plan is to continue on the current dose and regimen and continue to monitor every 2 months. I continued to discuss with her the effect of long term use of hydroxyurea. She wants to continue with the treatment for now.    2. Thrombosis prophylaxis.  Continue to be on aspirin.     3. Scalp numbness: Unclear etiology could be related to hydroxyurea. We'll continue to monitor closely.    4. Followup: In 2 months.     [facility], MD 9/9/20158:54 AM

## 2014-06-05 NOTE — Telephone Encounter (Signed)
gv and printed appt sched and avs for pt for NOV °

## 2014-06-14 ENCOUNTER — Other Ambulatory Visit: Payer: Self-pay

## 2014-06-14 DIAGNOSIS — Z1231 Encounter for screening mammogram for malignant neoplasm of breast: Secondary | ICD-10-CM

## 2014-07-15 ENCOUNTER — Ambulatory Visit
Admission: RE | Admit: 2014-07-15 | Discharge: 2014-07-15 | Disposition: A | Discharge status: Home / Self Care | Payer: Medicare Other | Source: Ambulatory Visit

## 2014-07-15 DIAGNOSIS — Z1231 Encounter for screening mammogram for malignant neoplasm of breast: Secondary | ICD-10-CM

## 2014-07-31 ENCOUNTER — Telehealth: Payer: Self-pay | Admitting: Oncology

## 2014-07-31 ENCOUNTER — Ambulatory Visit (HOSPITAL_BASED_OUTPATIENT_CLINIC_OR_DEPARTMENT_OTHER): Payer: Medicare Other | Admitting: Oncology

## 2014-07-31 ENCOUNTER — Other Ambulatory Visit (HOSPITAL_BASED_OUTPATIENT_CLINIC_OR_DEPARTMENT_OTHER): Payer: Medicare Other

## 2014-07-31 VITALS — BP 127/57 | HR 65 | Temp 98.5°F | Resp 18 | Ht 62.0 in | Wt 135.5 lb

## 2014-07-31 DIAGNOSIS — D473 Essential (hemorrhagic) thrombocythemia: Secondary | ICD-10-CM

## 2014-07-31 DIAGNOSIS — C946 Myelodysplastic disease, not classified: Secondary | ICD-10-CM

## 2014-07-31 DIAGNOSIS — R200 Anesthesia of skin: Secondary | ICD-10-CM | POA: Diagnosis not present

## 2014-07-31 DIAGNOSIS — D75839 Thrombocytosis, unspecified: Secondary | ICD-10-CM

## 2014-07-31 LAB — CBC WITH DIFFERENTIAL/PLATELET
BASO%: 1 % (ref 0.0–2.0)
Basophils Absolute: 0.1 10*3/uL (ref 0.0–0.1)
EOS%: 2.3 % (ref 0.0–7.0)
Eosinophils Absolute: 0.1 10*3/uL (ref 0.0–0.5)
HCT: 48 % — ABNORMAL HIGH (ref 34.8–46.6)
HEMOGLOBIN: 15.9 g/dL (ref 11.6–15.9)
LYMPH%: 23.9 % (ref 14.0–49.7)
MCH: 36.6 pg — ABNORMAL HIGH (ref 25.1–34.0)
MCHC: 33.1 g/dL (ref 31.5–36.0)
MCV: 110.5 fL — AB (ref 79.5–101.0)
MONO#: 0.6 10*3/uL (ref 0.1–0.9)
MONO%: 9.8 % (ref 0.0–14.0)
NEUT#: 4.1 10*3/uL (ref 1.5–6.5)
NEUT%: 63 % (ref 38.4–76.8)
Platelets: 563 10*3/uL — ABNORMAL HIGH (ref 145–400)
RBC: 4.34 10*6/uL (ref 3.70–5.45)
RDW: 14.2 % (ref 11.2–14.5)
WBC: 6.5 10*3/uL (ref 3.9–10.3)
lymph#: 1.6 10*3/uL (ref 0.9–3.3)

## 2014-07-31 LAB — COMPREHENSIVE METABOLIC PANEL (CC13)
ALT: 16 U/L (ref 0–55)
ANION GAP: 10 meq/L (ref 3–11)
AST: 23 U/L (ref 5–34)
Albumin: 4 g/dL (ref 3.5–5.0)
Alkaline Phosphatase: 63 U/L (ref 40–150)
BILIRUBIN TOTAL: 0.5 mg/dL (ref 0.20–1.20)
BUN: 12.2 mg/dL (ref 7.0–26.0)
CO2: 26 meq/L (ref 22–29)
CREATININE: 1 mg/dL (ref 0.6–1.1)
Calcium: 9.5 mg/dL (ref 8.4–10.4)
Chloride: 104 mEq/L (ref 98–109)
Glucose: 90 mg/dl (ref 70–140)
Potassium: 4.1 mEq/L (ref 3.5–5.1)
Sodium: 140 mEq/L (ref 136–145)
Total Protein: 6.6 g/dL (ref 6.4–8.3)

## 2014-07-31 NOTE — Telephone Encounter (Signed)
gv and printed appt sched and avs for pt for Jan 2016 °

## 2014-07-31 NOTE — Progress Notes (Signed)
Hematology and Oncology Follow Up Visit  Anna Fuller [id number] [birth year] 68 y.o. 07/31/2014 9:27 AM      Principle Diagnosis: 68 year old female with essential thrombocythemia.  She has JAK2 positive myeloproliferative disorder diagnosed 2007.  Prior Therapy: 1. She is status post anagrelide therapy discontinued due to do intolerance. 2. Status post bone marrow biopsy in August 2008 which showed early signs of myeloproliferative disorder.  Current therapy: She is on hydroxyurea alternating 1000 and 1500 mg every other day, has been treated with hydroxyurea since 2009.   Interim History:  Anna Fuller presents today for a followup visit. Since the last visit, she has been doing well. She has reported some slight numbing sensation on her scalp which has not changed since the last visit. No other neurological symptoms. She reports no headaches or blurry vision. No report any syncope or seizures. She continues to be active and has not reported any decline in her energy or performance status. She is planning a trip to [place] in the next few weeks.She has not reported any hospitalization or illnesses. She is not reporting any fevers or chills. She has reported no lymphadenopathy. No persistent daytime sweats or weight loss. She does report occasional  fatigue related to her hydroxyurea.  She had not reported any oral ulcers.  Did not report any lesions. She has continued to enjoy good quality of life at the time being. She is not reporting no bleeding or thrombotic events noted. No new complications with Hydroxyurea. No bleeding or clotting complications. She continues to be compliant with her medication at this time. Has not reported any chest pain or difficulty breathing. Has not reported any nausea or vomiting. Her rest of her review of system is unremarkable.  Medications: I have reviewed the patient's current medications. Current Outpatient Prescriptions  Medication Sig Dispense Refill  . aspirin  81 MG tablet Take 81 mg by mouth daily.        . cholecalciferol (VITAMIN D) 1000 UNITS tablet Take 2,000 Units by mouth daily.      Marland Kitchen estrogens, conjugated, (PREMARIN) 0.3 MG tablet Take 1 tablet (0.3 mg total) by mouth daily. Take daily for 21 days then do not take for 7 days.  30 tablet  11  . hydroxyurea (HYDREA) 500 MG capsule Take one tab twice a day.  Every other night take an additional tablet at night.  (Alternate 1010m and 15021m.  120 capsule  3  . simvastatin (ZOCOR) 40 MG tablet Take 1 tablet (40 mg total) by mouth at bedtime.  30 tablet  11       Allergies: Reviewed with the patient again today. Allergies  Allergen Reactions  . Iodine Anaphylaxis    Contrast dye  . Codeine     REACTION: severe nausea/vomiting  . Penicillins         Physical Exam:  Blood pressure 127/57, pulse 65, temperature 98.5 F (36.9 C), temperature source Oral, resp. rate 18, height 5' 2"  (1.575 m), weight 135 lb 8 oz (61.462 kg), SpO2 100 %. ECOG: 1 General appearance: alert oriented. She appears well. Head: Normocephalic, without obvious abnormality. Neck: no adenopathy, no carotid bruit, no JVD, supple. No thyromegaly. Lymph nodes: Cervical, supraclavicular, and axillary nodes normal.  Heart:regular rate and rhythm, S1, S2 normal.  Lung:chest clear, no wheezing, rales. No dullness to percussion. Abdomin: soft, non-tender, without masses or organomegaly EXT:no erythema, induration, or nodules Neurological examination: Showed no deficits she has normal gait. Skin examination: No rashes or lesions.  Lab  Results: Lab Results  Component Value Date   WBC 6.5 07/31/2014   HGB 15.9 07/31/2014   HCT 48.0* 07/31/2014   MCV 110.5* 07/31/2014   PLT 563* 07/31/2014     Chemistry      Component Value Date/Time   NA 139 06/05/2014 0824   NA 143 08/17/2012 0915   K 3.9 06/05/2014 0824   K 4.1 08/17/2012 0915   CL 105 01/30/2013 0825   CL 107 08/17/2012 0915   CO2 24 06/05/2014 0824    CO2 29 08/17/2012 0915   BUN 10.4 06/05/2014 0824   BUN 14 08/17/2012 0915   CREATININE 0.9 06/05/2014 0824   CREATININE 0.79 08/17/2012 0915   CREATININE 0.94 04/11/2012 0839      Component Value Date/Time   CALCIUM 9.1 06/05/2014 0824   CALCIUM 9.2 08/17/2012 0915   ALKPHOS 64 06/05/2014 0824   ALKPHOS 53 08/17/2012 0915   AST 23 06/05/2014 0824   AST 17 08/17/2012 0915   ALT 16 06/05/2014 0824   ALT 11 08/17/2012 0915   BILITOT 0.42 06/05/2014 0824   BILITOT 0.4 08/17/2012 0915      Impression and Plan:  This is a pleasant 68 year old female with the following issues.  1. Myeloproliferative disorder presenting with essential thrombocythemia.  She is currently on hydroxyurea alternating between 1000 and 1500 mg.  Platelet count today is 563 which remians under control. The laboratory values were discussed with her in detail.  The plan is to continue on the current dose and regimen and continue to monitor every 2 months.    2. Thrombosis prophylaxis.  Continue to be on aspirin.     3. Scalp numbness: Unclear etiology could be related to hydroxyurea. No other changes at this time.   4. Followup: In 2 months.     [facility], MD 11/4/20159:27 AM

## 2014-09-01 ENCOUNTER — Other Ambulatory Visit: Payer: Self-pay | Admitting: Internal Medicine

## 2014-09-01 NOTE — Telephone Encounter (Signed)
Not seen since Nov 25,2014. Usually comes once yearly. Can refill until we have a CPE appt. which will be after the first of the year. Pt aware she needs to come yearly.

## 2014-09-02 NOTE — Telephone Encounter (Signed)
Lipitor sent to pharmacy with note for patient to call and make appointment.

## 2014-09-05 ENCOUNTER — Other Ambulatory Visit: Payer: PRIVATE HEALTH INSURANCE | Admitting: Internal Medicine

## 2014-09-05 DIAGNOSIS — E785 Hyperlipidemia, unspecified: Secondary | ICD-10-CM

## 2014-09-05 DIAGNOSIS — M8580 Other specified disorders of bone density and structure, unspecified site: Secondary | ICD-10-CM | POA: Diagnosis not present

## 2014-09-05 DIAGNOSIS — D473 Essential (hemorrhagic) thrombocythemia: Secondary | ICD-10-CM | POA: Diagnosis not present

## 2014-09-05 DIAGNOSIS — E559 Vitamin D deficiency, unspecified: Secondary | ICD-10-CM | POA: Diagnosis not present

## 2014-09-05 LAB — COMPREHENSIVE METABOLIC PANEL
ALT: 16 U/L (ref 0–35)
AST: 23 U/L (ref 0–37)
Albumin: 3.9 g/dL (ref 3.5–5.2)
Alkaline Phosphatase: 93 U/L (ref 39–117)
BILIRUBIN TOTAL: 0.4 mg/dL (ref 0.2–1.2)
BUN: 13 mg/dL (ref 6–23)
CO2: 26 mEq/L (ref 19–32)
Calcium: 9.5 mg/dL (ref 8.4–10.5)
Chloride: 102 mEq/L (ref 96–112)
Creat: 0.85 mg/dL (ref 0.50–1.10)
GLUCOSE: 89 mg/dL (ref 70–99)
POTASSIUM: 5.4 meq/L — AB (ref 3.5–5.3)
Sodium: 137 mEq/L (ref 135–145)
TOTAL PROTEIN: 6.5 g/dL (ref 6.0–8.3)

## 2014-09-05 LAB — LIPID PANEL
CHOLESTEROL: 159 mg/dL (ref 0–200)
HDL: 53 mg/dL (ref >39)
LDL Cholesterol: 85 mg/dL (ref 0–99)
TRIGLYCERIDES: 107 mg/dL (ref <150)
Total CHOL/HDL Ratio: 3 Ratio
VLDL: 21 mg/dL (ref 0–40)

## 2014-09-05 LAB — TSH: TSH: 0.935 u[IU]/mL (ref 0.350–4.500)

## 2014-09-06 ENCOUNTER — Encounter: Payer: Self-pay | Admitting: Internal Medicine

## 2014-09-06 ENCOUNTER — Ambulatory Visit (INDEPENDENT_AMBULATORY_CARE_PROVIDER_SITE_OTHER): Payer: Medicare Other | Admitting: Internal Medicine

## 2014-09-06 VITALS — BP 100/68 | HR 89 | Temp 99.1°F | Ht 62.0 in | Wt 134.0 lb

## 2014-09-06 DIAGNOSIS — E785 Hyperlipidemia, unspecified: Secondary | ICD-10-CM | POA: Diagnosis not present

## 2014-09-06 DIAGNOSIS — Z0000 Encounter for general adult medical examination without abnormal findings: Secondary | ICD-10-CM

## 2014-09-06 DIAGNOSIS — IMO0001 Reserved for inherently not codable concepts without codable children: Secondary | ICD-10-CM

## 2014-09-06 DIAGNOSIS — D473 Essential (hemorrhagic) thrombocythemia: Secondary | ICD-10-CM | POA: Diagnosis not present

## 2014-09-06 DIAGNOSIS — R209 Unspecified disturbances of skin sensation: Secondary | ICD-10-CM | POA: Diagnosis not present

## 2014-09-06 DIAGNOSIS — D75839 Thrombocytosis, unspecified: Secondary | ICD-10-CM

## 2014-09-06 LAB — CBC WITH DIFFERENTIAL/PLATELET
Basophils Absolute: 0.1 10*3/uL (ref 0.0–0.1)
Basophils Relative: 1 % (ref 0–1)
EOS ABS: 0.2 10*3/uL (ref 0.0–0.7)
Eosinophils Relative: 2 % (ref 0–5)
HEMATOCRIT: 44.8 % (ref 36.0–46.0)
HEMOGLOBIN: 15.1 g/dL — AB (ref 12.0–15.0)
LYMPHS ABS: 1.7 10*3/uL (ref 0.7–4.0)
Lymphocytes Relative: 15 % (ref 12–46)
MCH: 36.7 pg — ABNORMAL HIGH (ref 26.0–34.0)
MCHC: 33.7 g/dL (ref 30.0–36.0)
MCV: 108.7 fL — ABNORMAL HIGH (ref 78.0–100.0)
MONOS PCT: 12 % (ref 3–12)
MPV: 8.5 fL — ABNORMAL LOW (ref 9.4–12.4)
Monocytes Absolute: 1.3 10*3/uL — ABNORMAL HIGH (ref 0.1–1.0)
Neutro Abs: 7.8 10*3/uL — ABNORMAL HIGH (ref 1.7–7.7)
Neutrophils Relative %: 70 % (ref 43–77)
Platelets: 990 10*3/uL — ABNORMAL HIGH (ref 150–400)
RBC: 4.12 MIL/uL (ref 3.87–5.11)
RDW: 15.3 % (ref 11.5–15.5)
WBC: 11.1 10*3/uL — ABNORMAL HIGH (ref 4.0–10.5)

## 2014-09-06 LAB — POCT URINALYSIS DIPSTICK
BILIRUBIN UA: NEGATIVE
Blood, UA: NEGATIVE
Glucose, UA: NEGATIVE
KETONES UA: NEGATIVE
LEUKOCYTES UA: NEGATIVE
NITRITE UA: NEGATIVE
Protein, UA: NEGATIVE
Spec Grav, UA: 1.02
Urobilinogen, UA: NEGATIVE
pH, UA: 5

## 2014-09-06 LAB — VITAMIN D 25 HYDROXY (VIT D DEFICIENCY, FRACTURES): VIT D 25 HYDROXY: 27 ng/mL — AB (ref 30–100)

## 2014-09-06 MED ORDER — HYDROXYUREA 500 MG PO CAPS: ORAL_CAPSULE | ORAL | Status: DC

## 2014-09-06 MED ORDER — SIMVASTATIN 40 MG PO TABS: 40.0000 mg | ORAL_TABLET | Freq: Every day | ORAL | Status: DC

## 2014-09-06 NOTE — Patient Instructions (Addendum)
Check Vitamin B12 level. Consider MRI of brain for numbness of ears and posterior scalp. See Dr. Alen Blew regarding elevated platelet count.

## 2014-09-06 NOTE — Progress Notes (Signed)
Subjective:    Patient ID: Anna Fuller, female    DOB: 08-Jan-1946, 68 y.o.   MRN: [id number]  HPI  68 year old White Female in today for health maintenance exam and evaluation of medical issues. She first presented here November 2013 for primary care. She is followed by Dr. should odd for essential thrombocytosis. She has a JAK2 positive myeloproliferative disorder diagnosed in 2007. Bone marrow in 2008 showed early signs of myeloproliferative disorder. Anagrelide was tried initially but patient did not tolerate it. Subsequently was changed to Hydrea. Platelet counts were able to be controlled around 400,000 with Hydrea.  She is allergic to contrast dye, cortisone, penicillin  Past medical history: History of hyperlipidemia treated with simvastatin. She is on Premarin. Does not want to be taken off of estrogen replacement.  Hospitalized with fever of unknown Anna Fuller. Cholecystectomy 1983. Hysterectomy with bilateral oophorectomy 1981. Right wrist surgery 2008. Foot surgery on both feet 1965, 1968, 1969. Had left knee and right knee surgeries.  History of and numbness: Polyps. Last colonoscopy June 2011 by Dr. Deatra Fuller.   Social history: Patient is single and retired. She completed 2 years of college. Does not smoke or consume alcohol. Enjoys walking and doing yard work.  Family history: Her sister is Anna Fuller who is also a patient here. No other siblings. Father died at age 50 with complications of diabetes. Mother died at 42 of pulmonary fibrosis.  Immunizations: Tetanus immunization 2006, Pneumovax immunization 2011, Zostavax vaccine 2011.  Medications: Hydrea, simvastatin, baby aspirin daily.    Review of Systems  Constitutional: Negative.   Neurological:       Complaining of numbness of ears and scalp.  All other systems reviewed and are negative.      Objective:   Physical Exam  Constitutional: She is oriented to person, place, and time. She appears  well-developed and well-nourished. No distress.  HENT:  Head: Normocephalic and atraumatic.  Right Ear: External ear normal.  Left Ear: External ear normal.  Mouth/Throat: Oropharynx is clear and moist. No oropharyngeal exudate.  Eyes: Conjunctivae are normal. Pupils are equal, round, and reactive to light. Right eye exhibits no discharge. No scleral icterus.  Neck: Neck supple. No JVD present. No thyromegaly present.  Cardiovascular: Normal rate, regular rhythm, normal heart sounds and intact distal pulses.   No murmur heard. Pulmonary/Chest: Effort normal and breath sounds normal. No respiratory distress. She has no wheezes. She has no rales. She exhibits no tenderness.  Breasts normal female  Abdominal: Soft. Bowel sounds are normal. She exhibits no distension and no mass. There is no tenderness. There is no rebound and no guarding.  Genitourinary:  Deferred status post hysterectomy with BSO 1981  Musculoskeletal: Normal range of motion. She exhibits no edema.  Lymphadenopathy:    She has no cervical adenopathy.  Neurological: She is alert and oriented to person, place, and time. She has normal reflexes. She displays normal reflexes. No cranial nerve deficit. Coordination normal.  Decreased sensation to pinprick and light touch years and scalp  Skin: Skin is warm and dry. No rash noted. She is not diaphoretic.  Psychiatric: She has a normal mood and affect. Her behavior is normal. Judgment and thought content normal.  Vitals reviewed.         Assessment & Plan:  Essential thrombocytosis /JaK 2 positive myeloproliferative disorder-current platelet can't reported as 990,000. Faxed results immediately to Dr. Alen Fuller. She has appointment to be seen there in early January.  Hyperlipidemia-stable on statin  History  of colon polyps-last colonoscopy 2011  Unexplained numbness of ears and scalp-consider MRI  Plan: Return in one year or as needed.  Subjective:   Patient presents for  Medicare Annual/Subsequent preventive examination.  Review Past Medical/Family/Social: See above   Risk Factors  Current exercise habits: Gardening Dietary issues discussed: Low fat low carbohydrate  Cardiac risk factors: Hyperlipidemia  Depression Screen  (Note: if answer to either of the following is "Yes", a more complete depression screening is indicated)   Over the past two weeks, have you felt down, depressed or hopeless? No  Over the past two weeks, have you felt little interest or pleasure in doing things? No Have you lost interest or pleasure in daily life? No Do you often feel hopeless? No Do you cry easily over simple problems? No   Activities of Daily Living  In your present state of health, do you have any difficulty performing the following activities?:   Driving? No  Managing money? No  Feeding yourself? No  Getting from bed to chair? No  Climbing a flight of stairs? No  Preparing food and eating?: No  Bathing or showering? No  Getting dressed: No  Getting to the toilet? No  Using the toilet:No  Moving around from place to place: No  In the past year have you fallen or had a near fall?:No  Are you sexually active? No  Do you have more than one partner? No   Hearing Difficulties: No  Do you often ask people to speak up or repeat themselves? No  Do you experience ringing or noises in your ears? No  Do you have difficulty understanding soft or whispered voices? No  Do you feel that you have a problem with memory? No Do you often misplace items? No    Home Safety:  Do you have a smoke alarm at your residence? Yes Do you have grab bars in the bathroom? Yes Do you have throw rugs in your house? No   Cognitive Testing  Alert? Yes Normal Appearance?Yes  Oriented to person? Yes Place? Yes  Time? Yes  Recall of three objects? Yes  Can perform simple calculations? Yes  Displays appropriate judgment?Yes  Can read the correct time from a watch face?Yes    List the Names of Other Physician/Practitioners you currently use:  See referral list for the physicians patient is currently seeing.  Hematologist   Review of Systems: See above   Objective:     General appearance: Appears stated age  Head: Normocephalic, without obvious abnormality, atraumatic  Eyes: conj clear, EOMi PEERLA  Ears: normal TM's and external ear canals both ears  Nose: Nares normal. Septum midline. Mucosa normal. No drainage or sinus tenderness.  Throat: lips, mucosa, and tongue normal; teeth and gums normal  Neck: no adenopathy, no carotid bruit, no JVD, supple, symmetrical, trachea midline and thyroid not enlarged, symmetric, no tenderness/mass/nodules  No CVA tenderness.  Lungs: clear to auscultation bilaterally  Breasts: normal appearance, no masses or tenderness Heart: regular rate and rhythm, S1, S2 normal, no murmur, click, rub or gallop  Abdomen: soft, non-tender; bowel sounds normal; no masses, no organomegaly  Musculoskeletal: ROM normal in all joints, no crepitus, no deformity, Normal muscle strengthen. Back  is symmetric, no curvature. Skin: Skin color, texture, turgor normal. No rashes or lesions  Lymph nodes: Cervical, supraclavicular, and axillary nodes normal.  Neurologic: CN 2 -12 Normal, Normal symmetric reflexes. Normal coordination and gait  Psych: Alert & Oriented x 3, Mood appear stable.  Assessment:    Annual wellness medicare exam   Plan:    During the course of the visit the patient was educated and counseled about appropriate screening and preventive services including:  Annual mammogram  Colonoscopy due 2016  Prevnar      Patient Instructions (the written plan) was given to the patient.  Medicare Attestation  I have personally reviewed:  The patient's medical and social history  Their use of alcohol, tobacco or illicit drugs  Their current medications and supplements  The patient's functional ability including  ADLs,fall risks, home safety risks, cognitive, and hearing and visual impairment  Diet and physical activities  Evidence for depression or mood disorders  The patient's weight, height, BMI, and visual acuity have been recorded in the chart. I have made referrals, counseling, and provided education to the patient based on review of the above and I have provided the patient with a written personalized care plan for preventive services.

## 2014-09-07 ENCOUNTER — Other Ambulatory Visit: Payer: Self-pay | Admitting: Internal Medicine

## 2014-09-09 ENCOUNTER — Encounter: Payer: Self-pay | Admitting: Oncology

## 2014-09-10 NOTE — Telephone Encounter (Signed)
Printed and to Dr. Hazeline Junker in-coming call desk folder.

## 2014-09-11 ENCOUNTER — Telehealth: Payer: Self-pay | Admitting: Medical Oncology

## 2014-09-11 NOTE — Telephone Encounter (Signed)
F/U to pt's email. Return call to patient, MD reviewed patient's email/concern regarding increase in platelets. Informed patient, per MD, that increase is likely related to the cold she is getting over. Patient expressed understanding. Confirmed upcoming appt and knows to contact office/clinic with further questions/concerns.

## 2014-10-02 ENCOUNTER — Other Ambulatory Visit (HOSPITAL_BASED_OUTPATIENT_CLINIC_OR_DEPARTMENT_OTHER): Payer: Medicare Other

## 2014-10-02 ENCOUNTER — Ambulatory Visit (HOSPITAL_BASED_OUTPATIENT_CLINIC_OR_DEPARTMENT_OTHER): Payer: Medicare Other | Admitting: Oncology

## 2014-10-02 ENCOUNTER — Telehealth: Payer: Self-pay | Admitting: Oncology

## 2014-10-02 VITALS — BP 106/66 | HR 66 | Temp 97.7°F | Resp 18 | Ht 62.0 in | Wt 136.1 lb

## 2014-10-02 DIAGNOSIS — D75839 Thrombocytosis, unspecified: Secondary | ICD-10-CM

## 2014-10-02 DIAGNOSIS — D473 Essential (hemorrhagic) thrombocythemia: Secondary | ICD-10-CM

## 2014-10-02 LAB — CBC WITH DIFFERENTIAL/PLATELET
BASO%: 0.9 % (ref 0.0–2.0)
Basophils Absolute: 0.1 10*3/uL (ref 0.0–0.1)
EOS%: 2.9 % (ref 0.0–7.0)
Eosinophils Absolute: 0.2 10*3/uL (ref 0.0–0.5)
HCT: 47 % — ABNORMAL HIGH (ref 34.8–46.6)
HGB: 15.4 g/dL (ref 11.6–15.9)
LYMPH%: 27.3 % (ref 14.0–49.7)
MCH: 36.8 pg — ABNORMAL HIGH (ref 25.1–34.0)
MCHC: 32.8 g/dL (ref 31.5–36.0)
MCV: 112.2 fL — ABNORMAL HIGH (ref 79.5–101.0)
MONO#: 0.6 10*3/uL (ref 0.1–0.9)
MONO%: 7.1 % (ref 0.0–14.0)
NEUT%: 61.8 % (ref 38.4–76.8)
NEUTROS ABS: 4.9 10*3/uL (ref 1.5–6.5)
Platelets: 600 10*3/uL — ABNORMAL HIGH (ref 145–400)
RBC: 4.19 10*6/uL (ref 3.70–5.45)
RDW: 14.6 % — AB (ref 11.2–14.5)
WBC: 7.9 10*3/uL (ref 3.9–10.3)
lymph#: 2.2 10*3/uL (ref 0.9–3.3)

## 2014-10-02 LAB — COMPREHENSIVE METABOLIC PANEL (CC13)
ALT: 18 U/L (ref 0–55)
AST: 23 U/L (ref 5–34)
Albumin: 3.9 g/dL (ref 3.5–5.0)
Alkaline Phosphatase: 80 U/L (ref 40–150)
Anion Gap: 8 meq/L (ref 3–11)
BUN: 11.7 mg/dL (ref 7.0–26.0)
CO2: 27 meq/L (ref 22–29)
Calcium: 9 mg/dL (ref 8.4–10.4)
Chloride: 106 meq/L (ref 98–109)
Creatinine: 1 mg/dL (ref 0.6–1.1)
EGFR: 59 ml/min/1.73 m2 — ABNORMAL LOW (ref >90)
Glucose: 97 mg/dL (ref 70–140)
Potassium: 3.7 meq/L (ref 3.5–5.1)
Sodium: 141 meq/L (ref 136–145)
Total Bilirubin: 0.45 mg/dL (ref 0.20–1.20)
Total Protein: 6.9 g/dL (ref 6.4–8.3)

## 2014-10-02 NOTE — Addendum Note (Signed)
Addended by: Randolm Idol on: 10/02/2014 09:45 AM   Modules accepted: Medications

## 2014-10-02 NOTE — Progress Notes (Signed)
Hematology and Oncology Follow Up Visit  Anna Fuller [id number] August 30, 1946 69 y.o. 10/02/2014 9:28 AM      Principle Diagnosis: 69 year old female with essential thrombocythemia.  She has JAK2 positive myeloproliferative disorder diagnosed 2007.  Prior Therapy: 1. She is status post anagrelide therapy discontinued due to do intolerance. 2. Status post bone marrow biopsy in August 2008 which showed early signs of myeloproliferative disorder.  Current therapy: She is on hydroxyurea alternating 1000 and 1500 mg every other day, has been treated with hydroxyurea since 2009.   Interim History:  Anna Fuller presents today for a followup visit. She reports no new complaints since the last visit. She reported to travel to [place] during the early part of December and returned around December 1. She did have a repeat CBC with Dr. Renold Genta on December 10 and platelet count was 990. She is asymptomatic from that rapid increase. She did report as mentioned recent travel and potentially an upper respiratory tract infection. Although she did not have a fever but she did have a cough and occasional congestion. These symptoms have resolved at this time. She has reported some slight numbing sensation on her scalp which has not changed since the last visit. No other neurological symptoms. She reports no headaches or blurry vision. No report any syncope or seizures. She continues to be active and has not reported any decline in her energy or performance status. She has not reported any hospitalization or illnesses. She is not reporting any fevers or chills. She has reported no lymphadenopathy. No persistent daytime sweats or weight loss. She does report occasional  fatigue related to her hydroxyurea.  She had not reported any oral ulcers.  Did not report any lesions. She has continued to enjoy good quality of life at the time being. She is not reporting no bleeding or thrombotic events noted. No new complications with  Hydroxyurea. No bleeding or clotting complications. She continues to be compliant with her medication at this time. Has not reported any chest pain or difficulty breathing. Has not reported any nausea or vomiting. Her rest of her review of system is unremarkable.  Medications: I have reviewed the patient's current medications. Current Outpatient Prescriptions  Medication Sig Dispense Refill  . aspirin 81 MG tablet Take 81 mg by mouth daily.        . cholecalciferol (VITAMIN D) 1000 UNITS tablet Take 2,000 Units by mouth daily.      Marland Kitchen estrogens, conjugated, (PREMARIN) 0.3 MG tablet Take 1 tablet (0.3 mg total) by mouth daily. Take daily for 21 days then do not take for 7 days.  30 tablet  11  . hydroxyurea (HYDREA) 500 MG capsule Take one tab twice a day.  Every other night take an additional tablet at night.  (Alternate 1087m and 150108m.  120 capsule  3  . simvastatin (ZOCOR) 40 MG tablet Take 1 tablet (40 mg total) by mouth at bedtime.  30 tablet  11       Allergies: Reviewed with the patient again today. Allergies  Allergen Reactions  . Iodine Anaphylaxis    Contrast dye  . Codeine     REACTION: severe nausea/vomiting  . Penicillins         Physical Exam:  Blood pressure 106/66, pulse 66, temperature 97.7 F (36.5 C), temperature source Oral, resp. rate 18, height 5' 2"  (1.575 m), weight 136 lb 1.6 oz (61.735 kg), SpO2 100 %. ECOG: 1 General appearance: alert oriented. She appears well. Head: Normocephalic, without obvious  abnormality. Neck: no adenopathy, no carotid bruit, no JVD, supple. No thyromegaly. Lymph nodes: Cervical, supraclavicular, and axillary nodes normal.  Heart:regular rate and rhythm, S1, S2 normal.  Lung:chest clear, no wheezing, rales. No dullness to percussion. Abdomin: soft, non-tender, without masses or organomegaly EXT:no erythema, induration, or nodules Neurological examination: Showed no deficits she has normal gait. Skin examination: No rashes  or lesions.  Lab Results: Lab Results  Component Value Date   WBC 7.9 10/02/2014   HGB 15.4 10/02/2014   HCT 47.0* 10/02/2014   MCV 112.2* 10/02/2014   PLT 600* 10/02/2014     Chemistry      Component Value Date/Time   NA 141 10/02/2014 0830   NA 137 09/05/2014 0904   K 3.7 10/02/2014 0830   K 5.4* 09/05/2014 0904   CL 102 09/05/2014 0904   CL 105 01/30/2013 0825   CO2 27 10/02/2014 0830   CO2 26 09/05/2014 0904   BUN 11.7 10/02/2014 0830   BUN 13 09/05/2014 0904   CREATININE 1.0 10/02/2014 0830   CREATININE 0.85 09/05/2014 0904   CREATININE 0.94 04/11/2012 0839      Component Value Date/Time   CALCIUM 9.0 10/02/2014 0830   CALCIUM 9.5 09/05/2014 0904   ALKPHOS 80 10/02/2014 0830   ALKPHOS 93 09/05/2014 0904   AST 23 10/02/2014 0830   AST 23 09/05/2014 0904   ALT 18 10/02/2014 0830   ALT 16 09/05/2014 0904   BILITOT 0.45 10/02/2014 0830   BILITOT 0.4 09/05/2014 0904      Impression and Plan:  This is a pleasant 69 year old female with the following issues.  1. Myeloproliferative disorder presenting with essential thrombocythemia.  She is currently on hydroxyurea alternating between 1000 and 1500 mg.  Platelet count today is 600 which remians under control. It is unclear to me why her platelet count went to 919. It is possible it could be related to a infection or chronic inflammatory process. The laboratory values were discussed with her in detail.  The plan is to continue on the current dose and regimen and continue to monitor every 2 months. I see no need to change her dosing at this time.   2. Thrombosis prophylaxis. Continue to be on aspirin.     3. Scalp numbness: Unclear etiology could be related to hydroxyurea. No other changes at this time.   4. Followup: In 2 months.     [facility], MD 1/6/20169:28 AM

## 2014-10-02 NOTE — Telephone Encounter (Signed)
pt did not want printed out....pt wrote appts down.Marland KitchenMarland Kitchen

## 2014-12-04 ENCOUNTER — Telehealth: Payer: Self-pay | Admitting: Oncology

## 2014-12-04 ENCOUNTER — Ambulatory Visit (HOSPITAL_BASED_OUTPATIENT_CLINIC_OR_DEPARTMENT_OTHER): Payer: Medicare Other | Admitting: Oncology

## 2014-12-04 ENCOUNTER — Other Ambulatory Visit (HOSPITAL_BASED_OUTPATIENT_CLINIC_OR_DEPARTMENT_OTHER): Payer: Medicare Other

## 2014-12-04 VITALS — BP 119/54 | HR 69 | Temp 98.0°F | Resp 18 | Ht 62.0 in | Wt 137.2 lb

## 2014-12-04 DIAGNOSIS — D75839 Thrombocytosis, unspecified: Secondary | ICD-10-CM

## 2014-12-04 DIAGNOSIS — R200 Anesthesia of skin: Secondary | ICD-10-CM | POA: Diagnosis not present

## 2014-12-04 DIAGNOSIS — D473 Essential (hemorrhagic) thrombocythemia: Secondary | ICD-10-CM | POA: Diagnosis not present

## 2014-12-04 LAB — CBC WITH DIFFERENTIAL/PLATELET
BASO%: 1.6 % (ref 0.0–2.0)
BASOS ABS: 0.1 10*3/uL (ref 0.0–0.1)
EOS%: 1.3 % (ref 0.0–7.0)
Eosinophils Absolute: 0.1 10*3/uL (ref 0.0–0.5)
HCT: 47.4 % — ABNORMAL HIGH (ref 34.8–46.6)
HGB: 15.6 g/dL (ref 11.6–15.9)
LYMPH%: 30 % (ref 14.0–49.7)
MCH: 36.4 pg — AB (ref 25.1–34.0)
MCHC: 32.8 g/dL (ref 31.5–36.0)
MCV: 111 fL — ABNORMAL HIGH (ref 79.5–101.0)
MONO#: 0.6 10*3/uL (ref 0.1–0.9)
MONO%: 9.1 % (ref 0.0–14.0)
NEUT%: 58 % (ref 38.4–76.8)
NEUTROS ABS: 3.7 10*3/uL (ref 1.5–6.5)
Platelets: 589 10*3/uL — ABNORMAL HIGH (ref 145–400)
RBC: 4.28 10*6/uL (ref 3.70–5.45)
RDW: 13.9 % (ref 11.2–14.5)
WBC: 6.5 10*3/uL (ref 3.9–10.3)
lymph#: 1.9 10*3/uL (ref 0.9–3.3)

## 2014-12-04 LAB — COMPREHENSIVE METABOLIC PANEL (CC13)
ALT: 19 U/L (ref 0–55)
ANION GAP: 11 meq/L (ref 3–11)
AST: 27 U/L (ref 5–34)
Albumin: 4 g/dL (ref 3.5–5.0)
Alkaline Phosphatase: 63 U/L (ref 40–150)
BUN: 10.7 mg/dL (ref 7.0–26.0)
CALCIUM: 9.5 mg/dL (ref 8.4–10.4)
CO2: 26 meq/L (ref 22–29)
Chloride: 104 mEq/L (ref 98–109)
Creatinine: 0.9 mg/dL (ref 0.6–1.1)
EGFR: 62 mL/min/{1.73_m2} — ABNORMAL LOW (ref >90)
GLUCOSE: 88 mg/dL (ref 70–140)
POTASSIUM: 4.2 meq/L (ref 3.5–5.1)
SODIUM: 140 meq/L (ref 136–145)
Total Bilirubin: 0.51 mg/dL (ref 0.20–1.20)
Total Protein: 6.9 g/dL (ref 6.4–8.3)

## 2014-12-04 NOTE — Telephone Encounter (Signed)
Pt confirmed labs/ov per 03/09 POF, gave pt AVS.... KJ  °

## 2014-12-04 NOTE — Progress Notes (Signed)
Hematology and Oncology Follow Up Visit  Anna Fuller [id number] [birth year] 69 y.o. 12/04/2014 9:04 AM      Principle Diagnosis: 69 year old female with essential thrombocythemia.  She has JAK2 positive myeloproliferative disorder diagnosed 2007.  Prior Therapy: 1. She is status post anagrelide therapy discontinued due to do intolerance. 2. Status post bone marrow biopsy in August 2008 which showed early signs of myeloproliferative disorder.  Current therapy: She is on hydroxyurea alternating 1000 and 1500 mg every other day, has been treated with hydroxyurea since 2009.   Interim History:  Anna Fuller presents today for a followup visit. She continues to do very well since the last visit. She does report occasional insomnia and poor sleeping hygiene but otherwise she is completely asymptomatic. She has reported some slight numbing sensation on her scalp which has not changed since the last visit. She is planning another cruise in November 2016. No other neurological symptoms. She reports no headaches or blurry vision. No report any syncope or seizures. She continues to be active and has not reported any decline in her energy or performance status. She has not reported any hospitalization or illnesses. She is not reporting any fevers or chills. She has reported no lymphadenopathy. No persistent daytime sweats or weight loss. She does report occasional  fatigue related to her hydroxyurea.  She had not reported any oral ulcers.  Did not report any lesions. She has continued to enjoy good quality of life at the time being. She is not reporting no bleeding or thrombotic events noted. No new complications with Hydroxyurea. No bleeding or clotting complications. She continues to be compliant with her medication at this time. Has not reported any chest pain or difficulty breathing. Has not reported any nausea or vomiting. Her rest of her review of system is unremarkable.  Medications: I have reviewed the  patient's current medications. Current Outpatient Prescriptions  Medication Sig Dispense Refill  . aspirin 81 MG tablet Take 81 mg by mouth daily.        . cholecalciferol (VITAMIN D) 1000 UNITS tablet Take 2,000 Units by mouth daily.      Marland Kitchen estrogens, conjugated, (PREMARIN) 0.3 MG tablet Take 1 tablet (0.3 mg total) by mouth daily. Take daily for 21 days then do not take for 7 days.  30 tablet  11  . hydroxyurea (HYDREA) 500 MG capsule Take one tab twice a day.  Every other night take an additional tablet at night.  (Alternate 1035m and 15081m.  120 capsule  3  . simvastatin (ZOCOR) 40 MG tablet Take 1 tablet (40 mg total) by mouth at bedtime.  30 tablet  11       Allergies: Reviewed with the patient again today. Allergies  Allergen Reactions  . Codeine Nausea And Vomiting  . Iodine Anaphylaxis    Contrast dye  . Penicillins         Physical Exam:  Blood pressure 119/54, pulse 69, temperature 98 F (36.7 C), temperature source Oral, resp. rate 18, height _0  (1.575 m), weight 137 lb 3.2 oz (62.234 kg). ECOG: 1 General appearance: alert oriented. She appears well. Head: Normocephalic, without obvious abnormality. Neck: no adenopathy, no carotid bruit, no JVD, supple. No thyromegaly. Lymph nodes: Cervical, supraclavicular, and axillary nodes normal.  Heart:regular rate and rhythm, S1, S2 normal.  Lung:chest clear, no wheezing, rales. No dullness to percussion. Abdomin: soft, non-tender, without masses or organomegaly EXT:no erythema, induration, or nodules Neurological examination: Showed no deficits she has normal gait. Skin examination:  No rashes or lesions.  Lab Results: Lab Results  Component Value Date   WBC 6.5 12/04/2014   HGB 15.6 12/04/2014   HCT 47.4* 12/04/2014   MCV 111.0* 12/04/2014   PLT 589* 12/04/2014     Chemistry      Component Value Date/Time   NA 141 10/02/2014 0830   NA 137 09/05/2014 0904   K 3.7 10/02/2014 0830   K 5.4* 09/05/2014 0904    CL 102 09/05/2014 0904   CL 105 01/30/2013 0825   CO2 27 10/02/2014 0830   CO2 26 09/05/2014 0904   BUN 11.7 10/02/2014 0830   BUN 13 09/05/2014 0904   CREATININE 1.0 10/02/2014 0830   CREATININE 0.85 09/05/2014 0904   CREATININE 0.94 04/11/2012 0839      Component Value Date/Time   CALCIUM 9.0 10/02/2014 0830   CALCIUM 9.5 09/05/2014 0904   ALKPHOS 80 10/02/2014 0830   ALKPHOS 93 09/05/2014 0904   AST 23 10/02/2014 0830   AST 23 09/05/2014 0904   ALT 18 10/02/2014 0830   ALT 16 09/05/2014 0904   BILITOT 0.45 10/02/2014 0830   BILITOT 0.4 09/05/2014 0904      Impression and Plan:  This is a pleasant 69 year old female with the following issues.  1. Myeloproliferative disorder presenting with essential thrombocythemia.  She is currently on hydroxyurea alternating between 1000 and 1500 mg.  Platelet count today is 589 which remians under control. It is unclear to me why her platelet count went to 919 few months ago. It is possible it could be related to a infection or chronic inflammatory process. At this time, it appears that her platelet count is declining and we'll continue with the same dose and schedule.   2. Thrombosis prophylaxis. Continue to be on aspirin.     3. Scalp numbness: Unclear etiology could be related to hydroxyurea. No other changes at this time.   4. Followup: In 2 months.     [facility], MD 3/9/20169:04 AM

## 2014-12-23 ENCOUNTER — Encounter: Payer: Self-pay | Admitting: Internal Medicine

## 2015-01-31 ENCOUNTER — Telehealth: Payer: Self-pay | Admitting: Oncology

## 2015-01-31 ENCOUNTER — Ambulatory Visit (HOSPITAL_BASED_OUTPATIENT_CLINIC_OR_DEPARTMENT_OTHER): Payer: Medicare Other | Admitting: Oncology

## 2015-01-31 ENCOUNTER — Other Ambulatory Visit (HOSPITAL_BASED_OUTPATIENT_CLINIC_OR_DEPARTMENT_OTHER): Payer: Medicare Other

## 2015-01-31 VITALS — BP 126/46 | HR 60 | Temp 97.4°F | Resp 18 | Ht 62.0 in | Wt 137.7 lb

## 2015-01-31 DIAGNOSIS — D75839 Thrombocytosis, unspecified: Secondary | ICD-10-CM

## 2015-01-31 DIAGNOSIS — D473 Essential (hemorrhagic) thrombocythemia: Secondary | ICD-10-CM

## 2015-01-31 LAB — COMPREHENSIVE METABOLIC PANEL (CC13)
ALT: 17 U/L (ref 0–55)
ANION GAP: 13 meq/L — AB (ref 3–11)
AST: 23 U/L (ref 5–34)
Albumin: 4.1 g/dL (ref 3.5–5.0)
Alkaline Phosphatase: 69 U/L (ref 40–150)
BUN: 13.2 mg/dL (ref 7.0–26.0)
CALCIUM: 9.3 mg/dL (ref 8.4–10.4)
CHLORIDE: 104 meq/L (ref 98–109)
CO2: 23 mEq/L (ref 22–29)
Creatinine: 0.9 mg/dL (ref 0.6–1.1)
EGFR: 69 mL/min/{1.73_m2} — AB (ref >90)
Glucose: 86 mg/dl (ref 70–140)
Potassium: 4.3 mEq/L (ref 3.5–5.1)
Sodium: 140 mEq/L (ref 136–145)
Total Bilirubin: 0.57 mg/dL (ref 0.20–1.20)
Total Protein: 6.9 g/dL (ref 6.4–8.3)

## 2015-01-31 LAB — CBC WITH DIFFERENTIAL/PLATELET
BASO%: 1.3 % (ref 0.0–2.0)
BASOS ABS: 0.1 10*3/uL (ref 0.0–0.1)
EOS%: 1.3 % (ref 0.0–7.0)
Eosinophils Absolute: 0.1 10*3/uL (ref 0.0–0.5)
HEMATOCRIT: 48.9 % — AB (ref 34.8–46.6)
HGB: 16.7 g/dL — ABNORMAL HIGH (ref 11.6–15.9)
LYMPH#: 2 10*3/uL (ref 0.9–3.3)
LYMPH%: 29.6 % (ref 14.0–49.7)
MCH: 37 pg — ABNORMAL HIGH (ref 25.1–34.0)
MCHC: 34.2 g/dL (ref 31.5–36.0)
MCV: 108.4 fL — ABNORMAL HIGH (ref 79.5–101.0)
MONO#: 0.6 10*3/uL (ref 0.1–0.9)
MONO%: 8.5 % (ref 0.0–14.0)
NEUT#: 4 10*3/uL (ref 1.5–6.5)
NEUT%: 59.3 % (ref 38.4–76.8)
Platelets: 601 10*3/uL — ABNORMAL HIGH (ref 145–400)
RBC: 4.51 10*6/uL (ref 3.70–5.45)
RDW: 13.5 % (ref 11.2–14.5)
WBC: 6.8 10*3/uL (ref 3.9–10.3)

## 2015-01-31 MED ORDER — HYDROXYUREA 500 MG PO CAPS: ORAL_CAPSULE | ORAL | Status: DC

## 2015-01-31 NOTE — Progress Notes (Signed)
Hematology and Oncology Follow Up Visit  Anna Fuller [id number] 1946/01/07 69 y.o. 01/31/2015 9:58 AM      Principle Diagnosis: 69 year old female with essential thrombocythemia.  She has JAK2 positive myeloproliferative disorder diagnosed 2007.  Prior Therapy: 1. She is status post anagrelide therapy discontinued due to do intolerance. 2. Status post bone marrow biopsy in August 2008 which showed early signs of myeloproliferative disorder.  Current therapy: She is on hydroxyurea alternating 1000 and 1500 mg every other day, has been treated with hydroxyurea since 2009.   Interim History:  Anna Fuller presents today for a followup visit. Since the last visit, she is doing relatively well. She has reported some intermittent left-sided neck pain that appear to be muscular in nature. She does not report any neurological symptoms or headaches.   She reports no blurry vision. No report any syncope or seizures. She continues to be active and has not reported any decline in her energy or performance status. She has not reported any hospitalization or illnesses. She is not reporting any fevers or chills. She has reported no lymphadenopathy. She does report occasional  fatigue related to her hydroxyurea.  She had not reported any oral ulcers. She is not reporting no bleeding or thrombotic events noted. No bleeding or clotting complications. She continues to be compliant with her medication at this time. Has not reported any chest pain or difficulty breathing. Has not reported any vomiting but does report occasional nausea. Her rest of her review of system is unremarkable.  Medications: I have reviewed the patient's current medications. Current Outpatient Prescriptions  Medication Sig Dispense Refill  . aspirin 81 MG tablet Take 81 mg by mouth daily.        . cholecalciferol (VITAMIN D) 1000 UNITS tablet Take 2,000 Units by mouth daily.      Marland Kitchen estrogens, conjugated, (PREMARIN) 0.3 MG tablet Take 1 tablet  (0.3 mg total) by mouth daily. Take daily for 21 days then do not take for 7 days.  30 tablet  11  . hydroxyurea (HYDREA) 500 MG capsule Take one tab twice a day.  Every other night take an additional tablet at night.  (Alternate 1094m and 15089m.  120 capsule  3  . simvastatin (ZOCOR) 40 MG tablet Take 1 tablet (40 mg total) by mouth at bedtime.  30 tablet  11       Allergies: Reviewed with the patient again today. Allergies  Allergen Reactions  . Codeine Nausea And Vomiting  . Iodine Anaphylaxis    Contrast dye  . Penicillins      Physical Exam:  Blood pressure 126/46, pulse 60, temperature 97.4 F (36.3 C), temperature source Oral, resp. rate 18, height _0  (1.575 m), weight 137 lb 11.2 oz (62.46 kg), SpO2 100 %. ECOG: 1 General appearance: alert oriented. Well-appearing not in any distress. Head: Normocephalic, without obvious abnormality. Neck: no adenopathy, no carotid bruit, no JVD, supple. No thyromegaly. Lymph nodes: Cervical, supraclavicular, and axillary nodes normal.  Heart:regular rate and rhythm, S1, S2 normal.  Lung:chest clear, no wheezing, rales. No dullness to percussion. Abdomin: soft, non-tender, without masses or organomegaly EXT:no erythema, induration, or nodules Neurological examination: Showed no deficits she has normal gait. Skin examination: No rashes or lesions.  Lab Results: Lab Results  Component Value Date   WBC 6.8 01/31/2015   HGB 16.7* 01/31/2015   HCT 48.9* 01/31/2015   MCV 108.4* 01/31/2015   PLT 601* 01/31/2015     Chemistry  Component Value Date/Time   NA 140 12/04/2014 0840   NA 137 09/05/2014 0904   K 4.2 12/04/2014 0840   K 5.4* 09/05/2014 0904   CL 102 09/05/2014 0904   CL 105 01/30/2013 0825   CO2 26 12/04/2014 0840   CO2 26 09/05/2014 0904   BUN 10.7 12/04/2014 0840   BUN 13 09/05/2014 0904   CREATININE 0.9 12/04/2014 0840   CREATININE 0.85 09/05/2014 0904   CREATININE 0.94 04/11/2012 0839      Component  Value Date/Time   CALCIUM 9.5 12/04/2014 0840   CALCIUM 9.5 09/05/2014 0904   ALKPHOS 63 12/04/2014 0840   ALKPHOS 93 09/05/2014 0904   AST 27 12/04/2014 0840   AST 23 09/05/2014 0904   ALT 19 12/04/2014 0840   ALT 16 09/05/2014 0904   BILITOT 0.51 12/04/2014 0840   BILITOT 0.4 09/05/2014 0904      Impression and Plan:  This is a pleasant 69 year old female with the following issues.  1. Myeloproliferative disorder presenting with essential thrombocythemia.  She is currently on hydroxyurea alternating between 1000 and 1500 mg.  Platelet count today is 602 which remians under control.  The plan is to continue with the current dose and schedule without any modification. We will consider change the dose if she has persistent elevation of platelet count above 800.   2. Thrombosis prophylaxis. Continue to be on aspirin.     3. Neck pain: Appeared to be muscular in nature and I recommended conservative management at this time.   4. Followup: In 2 months.     [facility], MD 5/6/20169:58 AM

## 2015-01-31 NOTE — Telephone Encounter (Signed)
Patient refused avs & calendar. Will verify MyChart

## 2015-02-05 ENCOUNTER — Encounter: Payer: Self-pay | Admitting: Gastroenterology

## 2015-03-24 ENCOUNTER — Other Ambulatory Visit: Payer: Self-pay

## 2015-03-28 ENCOUNTER — Other Ambulatory Visit (HOSPITAL_BASED_OUTPATIENT_CLINIC_OR_DEPARTMENT_OTHER): Payer: Medicare Other

## 2015-03-28 ENCOUNTER — Telehealth: Payer: Self-pay | Admitting: Oncology

## 2015-03-28 ENCOUNTER — Ambulatory Visit (HOSPITAL_BASED_OUTPATIENT_CLINIC_OR_DEPARTMENT_OTHER): Payer: Medicare Other | Admitting: Oncology

## 2015-03-28 VITALS — BP 125/46 | HR 80 | Temp 98.6°F | Resp 18 | Ht 62.0 in | Wt 138.4 lb

## 2015-03-28 DIAGNOSIS — D75839 Thrombocytosis, unspecified: Secondary | ICD-10-CM

## 2015-03-28 DIAGNOSIS — D473 Essential (hemorrhagic) thrombocythemia: Secondary | ICD-10-CM

## 2015-03-28 DIAGNOSIS — Z7982 Long term (current) use of aspirin: Secondary | ICD-10-CM

## 2015-03-28 LAB — CBC WITH DIFFERENTIAL/PLATELET
BASO%: 1 % (ref 0.0–2.0)
BASOS ABS: 0.1 10*3/uL (ref 0.0–0.1)
EOS%: 1.8 % (ref 0.0–7.0)
Eosinophils Absolute: 0.1 10*3/uL (ref 0.0–0.5)
HCT: 46.1 % (ref 34.8–46.6)
HGB: 15.6 g/dL (ref 11.6–15.9)
LYMPH#: 2.1 10*3/uL (ref 0.9–3.3)
LYMPH%: 30.6 % (ref 14.0–49.7)
MCH: 36.7 pg — ABNORMAL HIGH (ref 25.1–34.0)
MCHC: 33.9 g/dL (ref 31.5–36.0)
MCV: 108.4 fL — ABNORMAL HIGH (ref 79.5–101.0)
MONO#: 0.7 10*3/uL (ref 0.1–0.9)
MONO%: 9.5 % (ref 0.0–14.0)
NEUT#: 4 10*3/uL (ref 1.5–6.5)
NEUT%: 57.1 % (ref 38.4–76.8)
Platelets: 591 10*3/uL — ABNORMAL HIGH (ref 145–400)
RBC: 4.25 10*6/uL (ref 3.70–5.45)
RDW: 14.8 % — AB (ref 11.2–14.5)
WBC: 7 10*3/uL (ref 3.9–10.3)

## 2015-03-28 LAB — COMPREHENSIVE METABOLIC PANEL (CC13)
ALT: 21 U/L (ref 0–55)
AST: 27 U/L (ref 5–34)
Albumin: 3.9 g/dL (ref 3.5–5.0)
Alkaline Phosphatase: 74 U/L (ref 40–150)
Anion Gap: 11 mEq/L (ref 3–11)
BILIRUBIN TOTAL: 0.54 mg/dL (ref 0.20–1.20)
BUN: 11.8 mg/dL (ref 7.0–26.0)
CO2: 24 mEq/L (ref 22–29)
Calcium: 9.4 mg/dL (ref 8.4–10.4)
Chloride: 105 mEq/L (ref 98–109)
Creatinine: 1 mg/dL (ref 0.6–1.1)
EGFR: 59 mL/min/{1.73_m2} — AB (ref >90)
GLUCOSE: 90 mg/dL (ref 70–140)
Potassium: 4 mEq/L (ref 3.5–5.1)
SODIUM: 141 meq/L (ref 136–145)
Total Protein: 6.7 g/dL (ref 6.4–8.3)

## 2015-03-28 NOTE — Telephone Encounter (Signed)
sched appts...the patient aware....she didi not want print out

## 2015-03-28 NOTE — Progress Notes (Signed)
Hematology and Oncology Follow Up Visit  Anna Fuller [id number] [birth year] 69 y.o. 03/28/2015 8:21 AM      Principle Diagnosis: 69 year old female with essential thrombocythemia.  She has JAK2 positive myeloproliferative disorder diagnosed 2007.  Prior Therapy: 1. She is status post anagrelide therapy discontinued due to do intolerance. 2. Status post bone marrow biopsy in August 2008 which showed early signs of myeloproliferative disorder.  Current therapy: She is on hydroxyurea alternating 1000 and 1500 mg every other day, has been treated with hydroxyurea since 2009.   Interim History:  Anna Fuller presents today for a followup visit. Since the last visit, she continues to do very well without any new complaints. She continues to tolerate hydroxyurea without any new complications. She has not reported any recent bleeding or clotting tendencies. Had not reported any hospitalization or illnesses. She continues to perform activities of daily living without any decline. She reports that she is sleeping better after using over-the-counter melatonin.  She reports no blurry vision. No report any syncope or seizures. She is not reporting any fevers or chills. She has reported no lymphadenopathy. She does report occasional  fatigue related to her hydroxyurea.  She had not reported any oral ulcers. She is not reporting no bleeding or thrombotic events noted. No bleeding or clotting complications. She continues to be compliant with her medication at this time. Has not reported any chest pain or difficulty breathing. Has not reported any vomiting but does report occasional nausea. Her rest of her review of system is unremarkable.  Medications: I have reviewed the patient's current medications. Current Outpatient Prescriptions  Medication Sig Dispense Refill  . aspirin 81 MG tablet Take 81 mg by mouth daily.        . cholecalciferol (VITAMIN D) 1000 UNITS tablet Take 2,000 Units by mouth daily.      Marland Kitchen  estrogens, conjugated, (PREMARIN) 0.3 MG tablet Take 1 tablet (0.3 mg total) by mouth daily. Take daily for 21 days then do not take for 7 days.  30 tablet  11  . hydroxyurea (HYDREA) 500 MG capsule Take one tab twice a day.  Every other night take an additional tablet at night.  (Alternate 1031m and 15035m.  120 capsule  3  . simvastatin (ZOCOR) 40 MG tablet Take 1 tablet (40 mg total) by mouth at bedtime.  30 tablet  11       Allergies: Reviewed with the patient again today. Allergies  Allergen Reactions  . Codeine Nausea And Vomiting  . Iodine Anaphylaxis    Contrast dye  . Penicillins      Physical Exam:  Blood pressure 125/46, pulse 80, temperature 98.6 F (37 C), temperature source Oral, resp. rate 18, height 5' 2"  (1.575 m), weight 138 lb 6.4 oz (62.778 kg), SpO2 97 %. ECOG: 1 General appearance: alert oriented. Healthy-appearing woman not in any distress. Head: Normocephalic, without obvious abnormality. Neck: no adenopathy, no carotid bruit, no JVD, supple. No thyromegaly. Lymph nodes: Cervical, supraclavicular, and axillary nodes normal.  Heart:regular rate and rhythm, S1, S2 normal.  Lung:chest clear, no wheezing, rales. No dullness to percussion. Abdomin: soft, non-tender, without masses or organomegaly EXT:no erythema, induration, or nodules Neurological examination: Showed no deficits she has normal gait. Skin examination: No rashes or lesions.  Lab Results: Lab Results  Component Value Date   WBC 7.0 03/28/2015   HGB 15.6 03/28/2015   HCT 46.1 03/28/2015   MCV 108.4* 03/28/2015   PLT 591* 03/28/2015     Chemistry  Component Value Date/Time   NA 140 01/31/2015 0935   NA 137 09/05/2014 0904   K 4.3 01/31/2015 0935   K 5.4* 09/05/2014 0904   CL 102 09/05/2014 0904   CL 105 01/30/2013 0825   CO2 23 01/31/2015 0935   CO2 26 09/05/2014 0904   BUN 13.2 01/31/2015 0935   BUN 13 09/05/2014 0904   CREATININE 0.9 01/31/2015 0935   CREATININE 0.85  09/05/2014 0904   CREATININE 0.94 04/11/2012 0839      Component Value Date/Time   CALCIUM 9.3 01/31/2015 0935   CALCIUM 9.5 09/05/2014 0904   ALKPHOS 69 01/31/2015 0935   ALKPHOS 93 09/05/2014 0904   AST 23 01/31/2015 0935   AST 23 09/05/2014 0904   ALT 17 01/31/2015 0935   ALT 16 09/05/2014 0904   BILITOT 0.57 01/31/2015 0935   BILITOT 0.4 09/05/2014 0904      Impression and Plan:  This is a pleasant 69 year old female with the following issues.  1. Myeloproliferative disorder presenting with essential thrombocythemia.  She is currently on hydroxyurea alternating between 1000 and 1500 mg.  Platelet count today is 591which remians under control.  The plan is to continue with the current dose and schedule without any modification. We will consider change the dose if she has persistent elevation of platelet count above 800. She is at low risk of thrombosis at this time.   2. Thrombosis prophylaxis. Continue to be on aspirin. She is scheduled to have a colonoscopy in the near future and I see no problems with aspirin at this time. For precautionary reason, I have recommended holding the aspirin for 3 days before the procedure.   3. Neck pain: Improved at this time.   4. Followup: In 2 months.     [facility], MD 7/1/20168:21 AM

## 2015-04-10 ENCOUNTER — Ambulatory Visit (AMBULATORY_SURGERY_CENTER): Payer: Self-pay

## 2015-04-10 VITALS — Ht 63.0 in | Wt 141.0 lb

## 2015-04-10 DIAGNOSIS — Z860100 Personal history of colon polyps, unspecified: Secondary | ICD-10-CM

## 2015-04-10 MED ORDER — SUPREP BOWEL PREP KIT 17.5-3.13-1.6 GM/177ML PO SOLN: 1.0000 | Freq: Once | ORAL | Status: DC

## 2015-04-10 NOTE — Progress Notes (Signed)
No allergies to eggs or soy No diet/weight loss meds No home oxygen No past problems with anesthesia  Does not want to see emmi video; has had this procedure "a bunch of times over the last 20 yrs"

## 2015-04-23 ENCOUNTER — Ambulatory Visit (INDEPENDENT_AMBULATORY_CARE_PROVIDER_SITE_OTHER): Payer: Medicare Other | Admitting: Internal Medicine

## 2015-04-23 VITALS — BP 120/56 | HR 68

## 2015-04-23 DIAGNOSIS — Z23 Encounter for immunization: Secondary | ICD-10-CM

## 2015-04-23 NOTE — Progress Notes (Signed)
Patient presents today for Prevnar vaccine. VS Stable. Patient tolerated injection well.

## 2015-04-24 ENCOUNTER — Ambulatory Visit (AMBULATORY_SURGERY_CENTER): Payer: Medicare Other | Admitting: Gastroenterology

## 2015-04-24 ENCOUNTER — Encounter: Payer: Self-pay | Admitting: Gastroenterology

## 2015-04-24 VITALS — BP 128/63 | HR 62 | Temp 96.5°F | Resp 61 | Ht 63.0 in | Wt 141.0 lb

## 2015-04-24 DIAGNOSIS — D126 Benign neoplasm of colon, unspecified: Secondary | ICD-10-CM

## 2015-04-24 DIAGNOSIS — D122 Benign neoplasm of ascending colon: Secondary | ICD-10-CM | POA: Diagnosis not present

## 2015-04-24 DIAGNOSIS — D120 Benign neoplasm of cecum: Secondary | ICD-10-CM | POA: Diagnosis not present

## 2015-04-24 DIAGNOSIS — D123 Benign neoplasm of transverse colon: Secondary | ICD-10-CM

## 2015-04-24 DIAGNOSIS — Z1211 Encounter for screening for malignant neoplasm of colon: Secondary | ICD-10-CM | POA: Diagnosis not present

## 2015-04-24 DIAGNOSIS — D124 Benign neoplasm of descending colon: Secondary | ICD-10-CM | POA: Diagnosis not present

## 2015-04-24 DIAGNOSIS — Z86010 Personal history of colonic polyps: Secondary | ICD-10-CM | POA: Diagnosis present

## 2015-04-24 DIAGNOSIS — K5730 Diverticulosis of large intestine without perforation or abscess without bleeding: Secondary | ICD-10-CM

## 2015-04-24 MED ORDER — SODIUM CHLORIDE 0.9 % IV SOLN: 500.0000 mL | INTRAVENOUS | Status: DC

## 2015-04-24 MED ORDER — SODIUM CHLORIDE 0.9 % IV SOLN
500.0000 mL | INTRAVENOUS | Status: DC
Start: 2015-04-24 — End: 2015-04-25

## 2015-04-24 NOTE — Progress Notes (Signed)
Vocal order given by Dr. Erskine Emery at 09:11 that the pt needs to hold NSAIDs medications for 2 weeks.  Yes may continue taking aspirin 81 mg per Dr. Deatra Ina.  maw  No problems noted in the recovery room. maw

## 2015-04-24 NOTE — Patient Instructions (Signed)
YOU HAD AN ENDOSCOPIC PROCEDURE TODAY AT [facility]:   Refer to the procedure report that was given to you for any specific questions about what was found during the examination.  If the procedure report does not answer your questions, please call your gastroenterologist to clarify.  If you requested that your care partner not be given the details of your procedure findings, then the procedure report has been included in a sealed envelope for you to review at your convenience later.  YOU SHOULD EXPECT: Some feelings of bloating in the abdomen. Passage of more gas than usual.  Walking can help get rid of the air that was put into your GI tract during the procedure and reduce the bloating. If you had a lower endoscopy (such as a colonoscopy or flexible sigmoidoscopy) you may notice spotting of blood in your stool or on the toilet paper. If you underwent a bowel prep for your procedure, you may not have a normal bowel movement for a few days.  Please Note:  You might notice some irritation and congestion in your nose or some drainage.  This is from the oxygen used during your procedure.  There is no need for concern and it should clear up in a day or so.  SYMPTOMS TO REPORT IMMEDIATELY:   Following lower endoscopy (colonoscopy or flexible sigmoidoscopy):  Excessive amounts of blood in the stool  Significant tenderness or worsening of abdominal pains  Swelling of the abdomen that is new, acute  Fever of 100F or higher  For urgent or emergent issues, a gastroenterologist can be reached at any hour by calling [phone].   DIET: Your first meal following the procedure should be a small meal and then it is ok to progress to your normal diet. Heavy or fried foods are harder to digest and may make you feel nauseous or bloated.  Likewise, meals heavy in dairy and vegetables can increase bloating.  Drink plenty of fluids but you should avoid alcoholic beverages for 24  hours.  ACTIVITY:  You should plan to take it easy for the rest of today and you should NOT DRIVE or use heavy machinery until tomorrow (because of the sedation medicines used during the test).    FOLLOW UP: Our staff will call the number listed on your records the next business day following your procedure to check on you and address any questions or concerns that you may have regarding the information given to you following your procedure. If we do not reach you, we will leave a message.  However, if you are feeling well and you are not experiencing any problems, there is no need to return our call.  We will assume that you have returned to your regular daily activities without incident.  If any biopsies were taken you will be contacted by phone or by letter within the next 1-3 weeks.  Please call us at [phone] if you have not heard about the biopsies in 3 weeks.    SIGNATURES/CONFIDENTIALITY: You and/or your care partner have signed paperwork which will be entered into your electronic medical record.  These signatures attest to the fact that that the information above on your After Visit Summary has been reviewed and is understood.  Full responsibility of the confidentiality of this discharge information lies with you and/or your care-partner.     Handouts were given to your care partner on polyps, diverticulosis and a high fiber diet with liberal fluid intake. Hold any NSAIDS  medications for 2 weeks. You may continue taking aspirin 81 mg per Dr. Deatra Ina. You may resume your current medications today. Await biopsy results. Please call if any questions or concerns.

## 2015-04-24 NOTE — Op Note (Signed)
[place]  [address] [place], [id number]   COLONOSCOPY PROCEDURE REPORT  PATIENT: Anna Fuller, Anna Fuller  MR#: [id number] BIRTHDATE: August 17, 1946 , 89  yrs. old GENDER: female ENDOSCOPIST: Inda Castle, MD REFERRED FK:CLEX Parke Simmers, M.D. PROCEDURE DATE:  04/24/2015 PROCEDURE:   Colonoscopy, surveillance , Colonoscopy with cold biopsy polypectomy, and Colonoscopy with snare polypectomy First Screening Colonoscopy - Avg.  risk and is 50 yrs.  old or older - No.  Prior Negative Screening - Now for repeat screening. N/A  History of Adenoma - Now for follow-up colonoscopy & has been > or = to 3 yrs.  Yes hx of adenoma.  Has been 3 or more years since last colonoscopy.  Polyps removed today? Yes ASA CLASS:   Class II INDICATIONS:PH Colon Adenoma. 2011 MEDICATIONS: Monitored anesthesia care and Propofol 300 mg IV  DESCRIPTION OF PROCEDURE:   After the risks benefits and alternatives of the procedure were thoroughly explained, informed consent was obtained.  The digital rectal exam revealed no abnormalities of the rectum.   The LB [id number]  endoscope was introduced through the anus and advanced to the cecum, which was identified by both the appendix and ileocecal valve. No adverse events experienced.   The quality of the prep was (Suprep was used) excellent.  The instrument was then slowly withdrawn as the colon was fully examined. Estimated blood loss is zero unless otherwise noted in this procedure report.      COLON FINDINGS: There was mild diverticulosis noted in the sigmoid colon.   A flat polyp measuring 2 mm in size was found at the cecum.  A polypectomy was performed with cold forceps.   A sessile polyp measuring 2 mm in size was found in the ascending colon.  A polypectomy was performed with cold forceps.   A sessile polyp measuring 3 mm in size was found at the hepatic flexure.  A polypectomy was performed with a cold snare.  The resection  was complete, the polyp tissue was completely retrieved and sent to histology.   A sessile polyp measuring 6 mm in size was found at the splenic flexure.  A polypectomy was performed with a cold snare.  The resection was complete, the polyp tissue was completely retrieved and sent to histology.   Two sessile polyps measuring 4 mm in size were found in the descending colon.  Polypectomies were performed with a cold snare.  The resection was complete, the polyp tissue was completely retrieved and sent to histology.  Retroflexed views revealed no abnormalities. The time to cecum = 5.1 Withdrawal time = 12.5   The scope was withdrawn and the procedure completed. COMPLICATIONS: There were no immediate complications.  ENDOSCOPIC IMPRESSION: 1.   Mild diverticulosis was noted in the sigmoid colon 2.   Flat polyp was found at the cecum; polypectomy was performed with cold forceps 3.   Sessile polyp was found in the ascending colon; polypectomy was performed with cold forceps 4.   Sessile polyp was found at the hepatic flexure; polypectomy was performed with a cold snare 5.   Sessile polyp was found at the splenic flexure; polypectomy was performed with a cold snare 6.   Two sessile polyps were found in the descending colon; polypectomies were performed with a cold snare  RECOMMENDATIONS: Repeat Colonoscopy in 3 years.  eSigned:  Inda Castle, MD 04/24/2015 9:08 AM   cc:   PATIENT NAME:  Anna Fuller, Anna Fuller MR#: [id number]

## 2015-04-24 NOTE — Progress Notes (Signed)
Called to room to assist during endoscopic procedure.  Patient ID and intended procedure confirmed with present staff. Received instructions for my participation in the procedure from the performing physician.  

## 2015-04-24 NOTE — Progress Notes (Signed)
Transferred to recovery room. A/O x3, pleased with MAC.  VSS.  Report to Annette, RN. 

## 2015-04-25 ENCOUNTER — Telehealth: Payer: Self-pay | Admitting: Emergency Medicine

## 2015-04-25 NOTE — Telephone Encounter (Signed)
  Follow up Call-  Call back number 04/24/2015  Post procedure Call Back phone  # [phone]  Permission to leave phone message Yes     Patient questions:  Do you have a fever, pain , or abdominal swelling? No. Pain Score  0 *  Have you tolerated food without any problems? Yes.    Have you been able to return to your normal activities? Yes.    Do you have any questions about your discharge instructions: Diet   No. Medications  No. Follow up visit  No.  Do you have questions or concerns about your Care? No.  Actions: * If pain score is 4 or above: No action needed, pain <4.

## 2015-04-29 ENCOUNTER — Encounter: Payer: Self-pay | Admitting: Gastroenterology

## 2015-05-29 ENCOUNTER — Telehealth: Payer: Self-pay | Admitting: Oncology

## 2015-05-29 ENCOUNTER — Ambulatory Visit (HOSPITAL_BASED_OUTPATIENT_CLINIC_OR_DEPARTMENT_OTHER): Payer: Medicare Other | Admitting: Oncology

## 2015-05-29 ENCOUNTER — Other Ambulatory Visit (HOSPITAL_BASED_OUTPATIENT_CLINIC_OR_DEPARTMENT_OTHER): Payer: Medicare Other

## 2015-05-29 VITALS — BP 130/49 | HR 59 | Temp 98.2°F | Resp 18 | Wt 137.6 lb

## 2015-05-29 DIAGNOSIS — Z23 Encounter for immunization: Secondary | ICD-10-CM | POA: Diagnosis not present

## 2015-05-29 DIAGNOSIS — D473 Essential (hemorrhagic) thrombocythemia: Secondary | ICD-10-CM

## 2015-05-29 DIAGNOSIS — D75839 Thrombocytosis, unspecified: Secondary | ICD-10-CM

## 2015-05-29 LAB — CBC WITH DIFFERENTIAL/PLATELET
BASO%: 0.6 % (ref 0.0–2.0)
BASOS ABS: 0 10*3/uL (ref 0.0–0.1)
EOS ABS: 0.1 10*3/uL (ref 0.0–0.5)
EOS%: 0.7 % (ref 0.0–7.0)
HEMATOCRIT: 44.3 % (ref 34.8–46.6)
HEMOGLOBIN: 15.3 g/dL (ref 11.6–15.9)
LYMPH#: 2 10*3/uL (ref 0.9–3.3)
LYMPH%: 29.3 % (ref 14.0–49.7)
MCH: 37.5 pg — AB (ref 25.1–34.0)
MCHC: 34.5 g/dL (ref 31.5–36.0)
MCV: 108.6 fL — ABNORMAL HIGH (ref 79.5–101.0)
MONO#: 0.6 10*3/uL (ref 0.1–0.9)
MONO%: 9.4 % (ref 0.0–14.0)
NEUT#: 4 10*3/uL (ref 1.5–6.5)
NEUT%: 60 % (ref 38.4–76.8)
PLATELETS: 544 10*3/uL — AB (ref 145–400)
RBC: 4.08 10*6/uL (ref 3.70–5.45)
RDW: 14.5 % (ref 11.2–14.5)
WBC: 6.7 10*3/uL (ref 3.9–10.3)

## 2015-05-29 LAB — COMPREHENSIVE METABOLIC PANEL (CC13)
ALBUMIN: 3.8 g/dL (ref 3.5–5.0)
ALK PHOS: 64 U/L (ref 40–150)
ALT: 15 U/L (ref 0–55)
ANION GAP: 10 meq/L (ref 3–11)
AST: 25 U/L (ref 5–34)
BUN: 8.8 mg/dL (ref 7.0–26.0)
CALCIUM: 9.4 mg/dL (ref 8.4–10.4)
CO2: 25 mEq/L (ref 22–29)
Chloride: 105 mEq/L (ref 98–109)
Creatinine: 0.9 mg/dL (ref 0.6–1.1)
EGFR: 63 mL/min/{1.73_m2} — AB (ref >90)
Glucose: 91 mg/dl (ref 70–140)
Potassium: 3.6 mEq/L (ref 3.5–5.1)
Sodium: 141 mEq/L (ref 136–145)
Total Bilirubin: 0.54 mg/dL (ref 0.20–1.20)
Total Protein: 6.5 g/dL (ref 6.4–8.3)

## 2015-05-29 NOTE — Telephone Encounter (Signed)
Pt aware of appts....did not want print oug

## 2015-05-29 NOTE — Progress Notes (Signed)
Hematology and Oncology Follow Up Visit  Anna Fuller [id number] 1946-08-05 69 y.o. 05/29/2015 8:27 AM      Principle Diagnosis: 69 year old female with essential thrombocythemia.  She has JAK2 positive myeloproliferative disorder diagnosed 2007.  Prior Therapy: 1. She is status post anagrelide therapy discontinued due to do intolerance. 2. Status post bone marrow biopsy in August 2008 which showed early signs of myeloproliferative disorder.  Current therapy: She is on hydroxyurea alternating 1000 and 1500 mg every other day. She has been treated with hydroxyurea since 2009.   Interim History:  Anna Fuller presents today for a followup visit. Since the last visit, she reports no new complaints. She underwent a colonoscopy in 03/2015 and found to have multiple polyps that were removed. No malignancy identified. She continues to tolerate hydroxyurea without any new complications. She continues to have scalp numbness which happens periodically. Some associated with any neurological symptoms. It's not associated with any visual disturbances or any deficits. She has not reported any recent bleeding or clotting tendencies. Had not reported any hospitalization or illnesses. She continues to perform activities of daily living without any decline.   She reports no blurry vision. No report any syncope or seizures. She is not reporting any fevers or chills. She has reported no lymphadenopathy. She does report occasional  fatigue related to her hydroxyurea.  She had not reported any oral ulcers. She is not reporting no bleeding or thrombotic events noted. No bleeding or clotting complications. She continues to be compliant with her medication at this time. Has not reported any chest pain or difficulty breathing. Has not reported any vomiting but does report occasional nausea. Her rest of her review of system is unremarkable.  Medications: I have reviewed the patient's current medications. Current Outpatient  Prescriptions  Medication Sig Dispense Refill  . aspirin 81 MG tablet Take 81 mg by mouth daily.        . cholecalciferol (VITAMIN D) 1000 UNITS tablet Take 2,000 Units by mouth daily.      Marland Kitchen estrogens, conjugated, (PREMARIN) 0.3 MG tablet Take 1 tablet (0.3 mg total) by mouth daily. Take daily for 21 days then do not take for 7 days.  30 tablet  11  . hydroxyurea (HYDREA) 500 MG capsule Take one tab twice a day.  Every other night take an additional tablet at night.  (Alternate 1072m and 15074m.  120 capsule  3  . simvastatin (ZOCOR) 40 MG tablet Take 1 tablet (40 mg total) by mouth at bedtime.  30 tablet  11       Allergies: Reviewed with the patient again today. Allergies  Allergen Reactions  . Codeine Nausea And Vomiting  . Iodine Anaphylaxis    Contrast dye  . Penicillins      Physical Exam:  Blood pressure 130/49, pulse 59, temperature 98.2 F (36.8 C), temperature source Oral, resp. rate 18, weight 137 lb 9 oz (62.398 kg). ECOG: 1 General appearance: alert oriented. Not in any distress. Head: Normocephalic, without obvious abnormality. Neck: no adenopathy, no carotid bruit, no JVD, supple. No thyromegaly. Lymph nodes: Cervical, supraclavicular, and axillary nodes normal.  Heart:regular rate and rhythm, S1, S2 normal.  Lung:chest clear, no wheezing, rales. No dullness to percussion. Abdomin: soft, non-tender, without masses or organomegaly EXT:no erythema, induration, or nodules Neurological examination: Showed no motor, sensory or deep tendon reflex abnormalities. Gait is normal. Skin examination: No rashes or lesions.  Lab Results: Lab Results  Component Value Date   WBC 6.7 05/29/2015  HGB 15.3 05/29/2015   HCT 44.3 05/29/2015   MCV 108.6* 05/29/2015   PLT 544* 05/29/2015     Chemistry      Component Value Date/Time   NA 141 03/28/2015 0740   NA 137 09/05/2014 0904   K 4.0 03/28/2015 0740   K 5.4* 09/05/2014 0904   CL 102 09/05/2014 0904   CL 105  01/30/2013 0825   CO2 24 03/28/2015 0740   CO2 26 09/05/2014 0904   BUN 11.8 03/28/2015 0740   BUN 13 09/05/2014 0904   CREATININE 1.0 03/28/2015 0740   CREATININE 0.85 09/05/2014 0904   CREATININE 0.94 04/11/2012 0839      Component Value Date/Time   CALCIUM 9.4 03/28/2015 0740   CALCIUM 9.5 09/05/2014 0904   ALKPHOS 74 03/28/2015 0740   ALKPHOS 93 09/05/2014 0904   AST 27 03/28/2015 0740   AST 23 09/05/2014 0904   ALT 21 03/28/2015 0740   ALT 16 09/05/2014 0904   BILITOT 0.54 03/28/2015 0740   BILITOT 0.4 09/05/2014 0904      Impression and Plan:  This is a pleasant 69 year old female with the following issues.  1. Myeloproliferative disorder presenting with essential thrombocythemia and JAK-2 positive mutation.  She is currently on hydroxyurea alternating between 1000 and 1500 mg.  Platelet count today is 544which remians under control.  The plan is to continue with the current dose and schedule without any modification. We will consider change the dose if she has persistent elevation of platelet count above 800.    2. Thrombosis prophylaxis. Continue to be on aspirin. She is a very low risk of thrombosis in the future.   3. Scalp numbness: Unclear etiology at this time could be related to hydroxyurea. We'll continue to monitor moving forward.   4. Followup: In 2 months.     [facility], MD 9/1/20168:27 AM

## 2015-06-10 ENCOUNTER — Ambulatory Visit (INDEPENDENT_AMBULATORY_CARE_PROVIDER_SITE_OTHER): Payer: Medicare Other | Admitting: Internal Medicine

## 2015-06-10 ENCOUNTER — Encounter: Payer: Self-pay | Admitting: Internal Medicine

## 2015-06-10 ENCOUNTER — Other Ambulatory Visit: Payer: Self-pay

## 2015-06-10 VITALS — BP 120/64 | HR 79 | Temp 98.1°F | Wt 138.0 lb

## 2015-06-10 DIAGNOSIS — Z1231 Encounter for screening mammogram for malignant neoplasm of breast: Secondary | ICD-10-CM

## 2015-06-10 DIAGNOSIS — R8290 Unspecified abnormal findings in urine: Secondary | ICD-10-CM

## 2015-06-10 DIAGNOSIS — R8299 Other abnormal findings in urine: Secondary | ICD-10-CM | POA: Diagnosis not present

## 2015-06-10 DIAGNOSIS — R309 Painful micturition, unspecified: Secondary | ICD-10-CM | POA: Diagnosis not present

## 2015-06-10 DIAGNOSIS — N390 Urinary tract infection, site not specified: Secondary | ICD-10-CM

## 2015-06-10 DIAGNOSIS — R300 Dysuria: Secondary | ICD-10-CM | POA: Diagnosis not present

## 2015-06-10 LAB — POCT URINALYSIS DIPSTICK
Bilirubin, UA: NEGATIVE
Glucose, UA: NEGATIVE
KETONES UA: NEGATIVE
Nitrite, UA: POSITIVE
PROTEIN UA: NEGATIVE
UROBILINOGEN UA: NEGATIVE
pH, UA: 6

## 2015-06-10 MED ORDER — CIPROFLOXACIN HCL 250 MG PO TABS: 250.0000 mg | ORAL_TABLET | Freq: Two times a day (BID) | ORAL | Status: DC

## 2015-06-10 NOTE — Patient Instructions (Signed)
Cipro 250 mg twice daily for 7 days. Return in 7-10 days for follow-up.

## 2015-06-10 NOTE — Progress Notes (Signed)
   Subjective:    Patient ID: Anna Fuller, female    DOB: 02-23-1946, 69 y.o.   MRN: [id number]  HPI Patient says she's never had a urinary tract infection. On Friday, September 9, she developed some issues with difficulty urinating and some suprapubic pressure. Did not have dysuria. No nausea vomiting fever or shaking chills. Subsequently saw some small amount of hematuria in urine. Was very uncomfortable last night.  She has a history of JAK 2 positive myeloproliferative disorder diagnosed in 2007 followed by Hematologist. Platelet counts are high and controlled with Hydrea.    Review of Systems     Objective:   Physical Exam C dipstick urine results. No CVA tenderness. She is afebrile.       Assessment & Plan:  Acute urinary tract infection  Plan: Urine culture sent. Start Cipro 250 mg twice daily for 7 days. Follow-up in 7-10 days. Purchase Azo-Standard over-the-counter for bladder spasms.

## 2015-06-13 LAB — URINE CULTURE

## 2015-06-17 ENCOUNTER — Ambulatory Visit: Payer: Medicare Other | Admitting: Internal Medicine

## 2015-06-18 ENCOUNTER — Ambulatory Visit (INDEPENDENT_AMBULATORY_CARE_PROVIDER_SITE_OTHER): Payer: Medicare Other | Admitting: Internal Medicine

## 2015-06-18 ENCOUNTER — Encounter: Payer: Self-pay | Admitting: Internal Medicine

## 2015-06-18 VITALS — BP 114/58 | HR 69 | Temp 97.9°F | Wt 138.0 lb

## 2015-06-18 DIAGNOSIS — Z87440 Personal history of urinary (tract) infections: Secondary | ICD-10-CM | POA: Diagnosis not present

## 2015-06-18 LAB — POCT URINALYSIS DIPSTICK
Bilirubin, UA: NEGATIVE
Glucose, UA: NEGATIVE
Ketones, UA: NEGATIVE
LEUKOCYTES UA: NEGATIVE
NITRITE UA: NEGATIVE
PH UA: 6
PROTEIN UA: NEGATIVE
RBC UA: NEGATIVE
Spec Grav, UA: <=1.005
UROBILINOGEN UA: NEGATIVE

## 2015-06-21 NOTE — Progress Notes (Signed)
   Subjective:    Patient ID: Anna Fuller, female    DOB: 02/03/1946, 69 y.o.   MRN: [id number]  HPI Patient with history of JAK2 myeloproliferative disorder and essential thrombocytosis followed by hematologist. Was treated recently on September 13 with Cipro for urinary tract infection. This is her first urinary tract infection. Culture grew 80,000 colonies per milliliter E. Coli sensitive to Cipro. She's feeling much better. No urinary symptoms.    Review of Systems     Objective:   Physical Exam  Urine dipstick is now normal. No CVA tenderness.      Assessment & Plan:  Urinary tract infection resolved  Essential thrombocytosis  JAK 2 myeloproliferative disorder  Plan: Return December for physical examination

## 2015-06-21 NOTE — Patient Instructions (Signed)
It was a pleasure to see you today. Urinary tract infection has resolved

## 2015-07-18 ENCOUNTER — Ambulatory Visit
Admission: RE | Admit: 2015-07-18 | Discharge: 2015-07-18 | Disposition: A | Discharge status: Home / Self Care | Payer: Medicare Other | Source: Ambulatory Visit

## 2015-07-18 DIAGNOSIS — Z1231 Encounter for screening mammogram for malignant neoplasm of breast: Secondary | ICD-10-CM | POA: Diagnosis not present

## 2015-07-30 ENCOUNTER — Telehealth: Payer: Self-pay | Admitting: Oncology

## 2015-07-30 ENCOUNTER — Ambulatory Visit (HOSPITAL_BASED_OUTPATIENT_CLINIC_OR_DEPARTMENT_OTHER): Payer: Medicare Other | Admitting: Oncology

## 2015-07-30 ENCOUNTER — Other Ambulatory Visit (HOSPITAL_BASED_OUTPATIENT_CLINIC_OR_DEPARTMENT_OTHER): Payer: Medicare Other

## 2015-07-30 VITALS — BP 123/48 | HR 63 | Temp 97.7°F | Resp 18 | Ht 63.0 in | Wt 138.0 lb

## 2015-07-30 DIAGNOSIS — D473 Essential (hemorrhagic) thrombocythemia: Secondary | ICD-10-CM

## 2015-07-30 DIAGNOSIS — D75839 Thrombocytosis, unspecified: Secondary | ICD-10-CM

## 2015-07-30 DIAGNOSIS — Z7982 Long term (current) use of aspirin: Secondary | ICD-10-CM | POA: Diagnosis not present

## 2015-07-30 LAB — CBC WITH DIFFERENTIAL/PLATELET
BASO%: 0.7 % (ref 0.0–2.0)
BASOS ABS: 0.1 10*3/uL (ref 0.0–0.1)
EOS%: 0.9 % (ref 0.0–7.0)
Eosinophils Absolute: 0.1 10*3/uL (ref 0.0–0.5)
HCT: 44 % (ref 34.8–46.6)
HGB: 15 g/dL (ref 11.6–15.9)
LYMPH%: 28.2 % (ref 14.0–49.7)
MCH: 37.9 pg — AB (ref 25.1–34.0)
MCHC: 34.1 g/dL (ref 31.5–36.0)
MCV: 111.1 fL — ABNORMAL HIGH (ref 79.5–101.0)
MONO#: 0.7 10*3/uL (ref 0.1–0.9)
MONO%: 9.8 % (ref 0.0–14.0)
NEUT#: 4.1 10*3/uL (ref 1.5–6.5)
NEUT%: 60.4 % (ref 38.4–76.8)
PLATELETS: 569 10*3/uL — AB (ref 145–400)
RBC: 3.96 10*6/uL (ref 3.70–5.45)
RDW: 14.4 % (ref 11.2–14.5)
WBC: 6.8 10*3/uL (ref 3.9–10.3)
lymph#: 1.9 10*3/uL (ref 0.9–3.3)

## 2015-07-30 LAB — COMPREHENSIVE METABOLIC PANEL (CC13)
ALBUMIN: 3.8 g/dL (ref 3.5–5.0)
ALK PHOS: 60 U/L (ref 40–150)
ALT: 14 U/L (ref 0–55)
AST: 23 U/L (ref 5–34)
Anion Gap: 8 mEq/L (ref 3–11)
BILIRUBIN TOTAL: 0.53 mg/dL (ref 0.20–1.20)
BUN: 9.8 mg/dL (ref 7.0–26.0)
CO2: 25 meq/L (ref 22–29)
Calcium: 9.2 mg/dL (ref 8.4–10.4)
Chloride: 107 mEq/L (ref 98–109)
Creatinine: 0.9 mg/dL (ref 0.6–1.1)
EGFR: 65 mL/min/{1.73_m2} — ABNORMAL LOW (ref >90)
GLUCOSE: 86 mg/dL (ref 70–140)
Potassium: 3.6 mEq/L (ref 3.5–5.1)
SODIUM: 140 meq/L (ref 136–145)
TOTAL PROTEIN: 6.6 g/dL (ref 6.4–8.3)

## 2015-07-30 NOTE — Telephone Encounter (Signed)
Made all appts..the patient ok and aware..The patient didi not want print out

## 2015-07-30 NOTE — Progress Notes (Signed)
Hematology and Oncology Follow Up Visit  Anna Fuller [id number] [birth year] 69 y.o. 07/30/2015 8:58 AM      Principle Diagnosis: 69 year old female with essential thrombocythemia.  She has JAK2 positive myeloproliferative disorder diagnosed 2007.  Prior Therapy: 1. She is status post anagrelide therapy discontinued due to do intolerance. 2. Status post bone marrow biopsy in August 2008 which showed early signs of myeloproliferative disorder.  Current therapy: She is on hydroxyurea alternating 1000 and 1500 mg every other day. She has been treated with hydroxyurea since 2009.   Interim History: Anna Fuller presents today for a followup visit. Since the last visit, she continues to do well without any major changes. She did develop an episode of hematuria and was diagnosed with a urinary tract infection. She took a course of Cipro and her hematuria resolved. She continues to tolerate hydroxyurea without any new complications. She continues to have scalp numbness which has not changed dramatically but does aggravates her at times. She denies any interference with her activity level or any neurological symptoms. It's not associated with any visual disturbances or any deficits. She remains compliant with her hydroxyurea despite the symptoms.  She has not reported any recent bleeding or clotting tendencies. Had not reported any hospitalization or illnesses. She continues to perform activities of daily living without any decline. She is planning a trip to [place] and feels she will be able to handle a trip without any difficulty.  She reports no blurry vision. No report any syncope or seizures. She is not reporting any fevers or chills. She has reported no lymphadenopathy. She had not reported any oral ulcers. She is not reporting no bleeding or thrombotic events noted. No bleeding or clotting complications. Has not reported any chest pain or difficulty breathing. Has not reported any vomiting but  does report occasional nausea. Her rest of her review of system is unremarkable.  Medications: I have reviewed the patient's current medications. Current Outpatient Prescriptions  Medication Sig Dispense Refill  . aspirin 81 MG tablet Take 81 mg by mouth daily.        . cholecalciferol (VITAMIN D) 1000 UNITS tablet Take 2,000 Units by mouth daily.      Fuller Kitchen estrogens, conjugated, (PREMARIN) 0.3 MG tablet Take 1 tablet (0.3 mg total) by mouth daily. Take daily for 21 days then do not take for 7 days.  30 tablet  11  . hydroxyurea (HYDREA) 500 MG capsule Take one tab twice a day.  Every other night take an additional tablet at night.  (Alternate 1057m and 1507m.  120 capsule  3  . simvastatin (ZOCOR) 40 MG tablet Take 1 tablet (40 mg total) by mouth at bedtime.  30 tablet  11       Allergies: Reviewed with the patient again today. Allergies  Allergen Reactions  . Codeine Nausea And Vomiting  . Iodine Anaphylaxis    Contrast dye  . Penicillins      Physical Exam:  Blood pressure 123/48, pulse 63, temperature 97.7 F (36.5 C), temperature source Oral, resp. rate 18, height 5' 3"  (1.6 m), weight 138 lb (62.596 kg), SpO2 98 %. ECOG: 1 General appearance: alert oriented. Not in any distress. Head: Normocephalic, without obvious abnormality. No scalp also 0 lesions. Neck: no adenopathy, no carotid bruit, no JVD, supple. No thyromegaly. Lymph nodes: Cervical, supraclavicular, and axillary nodes normal.  Heart:regular rate and rhythm, S1, S2 normal.  Lung:chest clear, no wheezing, rales. No dullness to percussion. Abdomin: soft, non-tender, without masses  or organomegaly EXT:no erythema, induration, or nodules Neurological examination: Showed no deficits. Gait is normal. Skin examination: No rashes or lesions.  Lab Results: Lab Results  Component Value Date   WBC 6.8 07/30/2015   HGB 15.0 07/30/2015   HCT 44.0 07/30/2015   MCV 111.1* 07/30/2015   PLT 569* 07/30/2015      Chemistry      Component Value Date/Time   NA 141 05/29/2015 0756   NA 137 09/05/2014 0904   K 3.6 05/29/2015 0756   K 5.4* 09/05/2014 0904   CL 102 09/05/2014 0904   CL 105 01/30/2013 0825   CO2 25 05/29/2015 0756   CO2 26 09/05/2014 0904   BUN 8.8 05/29/2015 0756   BUN 13 09/05/2014 0904   CREATININE 0.9 05/29/2015 0756   CREATININE 0.85 09/05/2014 0904   CREATININE 0.94 04/11/2012 0839      Component Value Date/Time   CALCIUM 9.4 05/29/2015 0756   CALCIUM 9.5 09/05/2014 0904   ALKPHOS 64 05/29/2015 0756   ALKPHOS 93 09/05/2014 0904   AST 25 05/29/2015 0756   AST 23 09/05/2014 0904   ALT 15 05/29/2015 0756   ALT 16 09/05/2014 0904   BILITOT 0.54 05/29/2015 0756   BILITOT 0.4 09/05/2014 0904      Impression and Plan:  This is a pleasant 69 year old female with the following issues.  1. Myeloproliferative disorder presenting with essential thrombocythemia and JAK-2 positive mutation.  She is currently on hydroxyurea alternating between 1000 and 1500 mg.  Platelet count today is 569 which remians under control. The plan is to continue with the current dose and schedule without any modification. We will consider change the dose if she has persistent elevation of platelet count above 800.  2. Thrombosis prophylaxis. Continue to be on aspirin. She has no thrombosis history puts her risk for future thrombosis to be low. 3. Scalp numbness: Unclear etiology at this time could be related to hydroxyurea. We'll continue to monitor moving forward. 4. Hematuria: Appears to be related to a urinary tract infection which have resolved. I did encourage her to report any recurrence of this episode as she might need a urology referral for possible evaluation. 5. Follow-up: Will be in 2 months.        [id number],OIPPG, MD 11/2/20168:58 AM

## 2015-09-11 ENCOUNTER — Other Ambulatory Visit: Payer: Medicare Other | Admitting: Internal Medicine

## 2015-09-11 ENCOUNTER — Other Ambulatory Visit: Payer: Self-pay | Admitting: Internal Medicine

## 2015-09-11 DIAGNOSIS — Z0000 Encounter for general adult medical examination without abnormal findings: Secondary | ICD-10-CM | POA: Diagnosis not present

## 2015-09-11 DIAGNOSIS — Z79899 Other long term (current) drug therapy: Secondary | ICD-10-CM | POA: Diagnosis not present

## 2015-09-11 DIAGNOSIS — D473 Essential (hemorrhagic) thrombocythemia: Secondary | ICD-10-CM | POA: Diagnosis not present

## 2015-09-11 DIAGNOSIS — E559 Vitamin D deficiency, unspecified: Secondary | ICD-10-CM

## 2015-09-11 DIAGNOSIS — E785 Hyperlipidemia, unspecified: Secondary | ICD-10-CM | POA: Diagnosis not present

## 2015-09-11 DIAGNOSIS — Z1159 Encounter for screening for other viral diseases: Secondary | ICD-10-CM | POA: Diagnosis not present

## 2015-09-11 LAB — CBC WITH DIFFERENTIAL/PLATELET
BASOS ABS: 0.1 10*3/uL (ref 0.0–0.1)
BASOS PCT: 1 % (ref 0–1)
Eosinophils Absolute: 0.1 10*3/uL (ref 0.0–0.7)
Eosinophils Relative: 2 % (ref 0–5)
HCT: 45.8 % (ref 36.0–46.0)
HEMOGLOBIN: 15.6 g/dL — AB (ref 12.0–15.0)
Lymphocytes Relative: 31 % (ref 12–46)
Lymphs Abs: 2.2 10*3/uL (ref 0.7–4.0)
MCH: 37.9 pg — ABNORMAL HIGH (ref 26.0–34.0)
MCHC: 34.1 g/dL (ref 30.0–36.0)
MCV: 111.2 fL — ABNORMAL HIGH (ref 78.0–100.0)
MONO ABS: 0.6 10*3/uL (ref 0.1–1.0)
MPV: 8.9 fL (ref 8.6–12.4)
Monocytes Relative: 9 % (ref 3–12)
NEUTROS ABS: 4.1 10*3/uL (ref 1.7–7.7)
NEUTROS PCT: 57 % (ref 43–77)
Platelets: 575 10*3/uL — ABNORMAL HIGH (ref 150–400)
RBC: 4.12 MIL/uL (ref 3.87–5.11)
RDW: 15.3 % (ref 11.5–15.5)
WBC: 7.2 10*3/uL (ref 4.0–10.5)

## 2015-09-11 LAB — COMPLETE METABOLIC PANEL WITH GFR
ALBUMIN: 4.3 g/dL (ref 3.6–5.1)
ALK PHOS: 56 U/L (ref 33–130)
ALT: 13 U/L (ref 6–29)
AST: 21 U/L (ref 10–35)
BUN: 16 mg/dL (ref 7–25)
CHLORIDE: 103 mmol/L (ref 98–110)
CO2: 26 mmol/L (ref 20–31)
Calcium: 9.3 mg/dL (ref 8.6–10.4)
Creat: 0.89 mg/dL (ref 0.60–0.93)
GFR, Est African American: 75 mL/min (ref >=60)
GFR, Est Non African American: 65 mL/min (ref >=60)
GLUCOSE: 71 mg/dL (ref 65–99)
POTASSIUM: 4.9 mmol/L (ref 3.5–5.3)
SODIUM: 138 mmol/L (ref 135–146)
Total Bilirubin: 0.6 mg/dL (ref 0.2–1.2)
Total Protein: 6.8 g/dL (ref 6.1–8.1)

## 2015-09-11 LAB — LIPID PANEL
Cholesterol: 168 mg/dL (ref 125–200)
HDL: 66 mg/dL (ref >=46)
LDL CALC: 76 mg/dL (ref <130)
TRIGLYCERIDES: 130 mg/dL (ref <150)
Total CHOL/HDL Ratio: 2.5 Ratio (ref <=5.0)
VLDL: 26 mg/dL (ref <30)

## 2015-09-11 LAB — TSH: TSH: 2.854 u[IU]/mL (ref 0.350–4.500)

## 2015-09-12 ENCOUNTER — Encounter: Payer: Self-pay | Admitting: Internal Medicine

## 2015-09-12 ENCOUNTER — Ambulatory Visit (INDEPENDENT_AMBULATORY_CARE_PROVIDER_SITE_OTHER): Payer: Medicare Other | Admitting: Internal Medicine

## 2015-09-12 VITALS — BP 112/78 | HR 60 | Temp 97.8°F | Resp 20 | Ht 63.0 in | Wt 136.0 lb

## 2015-09-12 DIAGNOSIS — E785 Hyperlipidemia, unspecified: Secondary | ICD-10-CM

## 2015-09-12 DIAGNOSIS — D473 Essential (hemorrhagic) thrombocythemia: Secondary | ICD-10-CM

## 2015-09-12 DIAGNOSIS — Z1589 Genetic susceptibility to other disease: Secondary | ICD-10-CM

## 2015-09-12 DIAGNOSIS — Z0000 Encounter for general adult medical examination without abnormal findings: Secondary | ICD-10-CM | POA: Diagnosis not present

## 2015-09-12 LAB — POCT URINALYSIS DIPSTICK
Bilirubin, UA: NEGATIVE
Glucose, UA: NEGATIVE
Ketones, UA: NEGATIVE
LEUKOCYTES UA: NEGATIVE
NITRITE UA: NEGATIVE
PH UA: 5
PROTEIN UA: NEGATIVE
RBC UA: NEGATIVE
Spec Grav, UA: 1.01
Urobilinogen, UA: 0.2

## 2015-09-12 LAB — VITAMIN D 25 HYDROXY (VIT D DEFICIENCY, FRACTURES): VIT D 25 HYDROXY: 32 ng/mL (ref 30–100)

## 2015-09-12 MED ORDER — ESTROGENS CONJUGATED 0.3 MG PO TABS: ORAL_TABLET | ORAL | Status: DC

## 2015-09-12 MED ORDER — SIMVASTATIN 40 MG PO TABS: 40.0000 mg | ORAL_TABLET | Freq: Every day | ORAL | Status: DC

## 2015-09-12 NOTE — Progress Notes (Signed)
Subjective:    Patient ID: Anna Fuller, female    DOB: 03/08/1946, 69 y.o.   MRN: [id number]  HPI 69 year old White Female in today for health maintenance exam and evaluation of medical issues. Followed by Dr. Alen Blew for   Essential Thrombocytosis. She has a history of JAK2  positive myeloproliferative disorder diagnosed in 2007. Bone marrow in 2008 showed early signs of myeloproliferative disorder. Anagrelide was tried initially but patient did not tolerate it. Subsequently was changed to Hydrea. Platelet counts were able to be controlled around the 400,000 range with Hydrea.  She is allergic to contrast dye, cortisone, penicillin.  Past medical history: History of hyperlipidemia treated with simvastatin. She is on Premarin. Does not want to be taken off of estrogen replacement.  Hospitalized with fever of unknown origin in 36. Cholecystectomy 1983. Hysterectomy with bilateral oophorectomy 1981. Right wrist surgery 2008. Foot surgery of both feet 1965, 1968, 1969. Has had  left and right knee surgeries.  Social history: Patient is single and retired. 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 She completed 2 years of college. Does not smoke or consume alcohol. Enjoys walking and doing yard work. Just returned from trip to [place] which she enjoyed with her sister.  Family history: Her sister is Raimey Whitefield who is also a patient here. No other siblings. Father died at age 54 with complications of diabetes. Mother died at age 75 of pulmonary fibrosis.  She had tetanus immunization in 2006 but had a significant local reaction so that will not be repeated unless she is injured. Pneumovax immunization 2011. Zostavax vaccine 2011. Prevnar July 2016.    Review of Systems  Constitutional: Negative.   Respiratory: Negative.   Cardiovascular: Negative.   Genitourinary: Negative.   Psychiatric/Behavioral: Negative.       Objective:   Physical Exam  Constitutional: She is oriented to person, place, and time. She appears well-developed and well-nourished. No distress.  HENT:  Head: Normocephalic and atraumatic.  Right Ear: External ear normal.  Left Ear: External ear normal.  Mouth/Throat: Oropharynx is clear and moist. No oropharyngeal exudate.  Eyes: Conjunctivae and EOM are normal. Pupils are equal, round, and reactive to light. Right eye exhibits no discharge. Left eye exhibits no discharge. No scleral icterus.  Neck: Neck supple. No JVD present. No thyromegaly present.  Cardiovascular: Normal rate, regular rhythm and normal heart sounds.   No murmur heard. Pulmonary/Chest: Effort normal and breath sounds normal. No respiratory distress. She has no wheezes. She has no rales.  Breasts normal female without masses  Abdominal: Soft. Bowel sounds are normal. She exhibits no distension and no mass. There is no rebound and no guarding.  Genitourinary:   patient had hysterectomy with BSO 1981  Musculoskeletal: Normal range of motion. She exhibits no edema.  Lymphadenopathy:    She has no cervical adenopathy.  Neurological: She is alert and oriented to person, place, and time. She has normal reflexes. She displays normal reflexes. No cranial nerve deficit. Coordination normal.  Skin: Skin is warm and dry. No rash noted. She is not diaphoretic.  Psychiatric: She has a normal mood and affect. Her behavior is normal. Judgment and thought content normal.  Vitals reviewed.         Assessment & Plan:   Essential thrombocytosis  JAK 2  myeloproliferative disorder  Hyperlipidemia-normal lipid panel and liver functions on statin therapy  Estrogen replacement  Plan: Continue same medications and return in  one year. Medications refill for one year. Lab work reviewed and is entirely within normal limits. Platelet counts continued to be monitored by hematology.  Subjective:   Patient presents for Medicare  Annual/Subsequent preventive examination.  Review Past Medical/Family/Social: See above   Risk Factors  Current exercise habits: Regular exercise Dietary issues discussed: Low fat low carbohydrate  Cardiac risk factors: Hyperlipidemia  Depression Screen  (Note: if answer to either of the following is "Yes", a more complete depression screening is indicated)   Over the past two weeks, have you felt down, depressed or hopeless? No  Over the past two weeks, have you felt little interest or pleasure in doing things? No Have you lost interest or pleasure in daily life? No Do you often feel hopeless? No Do you cry easily over simple problems? No   Activities of Daily Living  In your present state of health, do you have any difficulty performing the following activities?:   Driving? No  Managing money? No  Feeding yourself? No  Getting from bed to chair? No  Climbing a flight of stairs? No  Preparing food and eating?: No  Bathing or showering? No  Getting dressed: No  Getting to the toilet? No  Using the toilet:No  Moving around from place to place: No  In the past year have you fallen or had a near fall?: Yes Are you sexually active? No  Do you have more than one partner? No   Hearing Difficulties: No  Do you often ask people to speak up or repeat themselves? No  Do you experience ringing or noises in your ears? No  Do you have difficulty understanding soft or whispered voices? No  Do you feel that you have a problem with memory? No Do you often misplace items? No    Home Safety:  Do you have a smoke alarm at your residence? Yes Do you have grab bars in the bathroom? No Do you have throw rugs in your house? No   Cognitive Testing  Alert? Yes Normal Appearance?Yes  Oriented to person? Yes Place? Yes  Time? Yes  Recall of three objects? Yes  Can perform simple calculations? Yes  Displays appropriate judgment?Yes  Can read the correct time from a watch face?Yes    List the Names of Other Physician/Practitioners you currently use:  See referral list for the physicians patient is currently seeing.  Dr. Alen Blew   Review of Systems: See above  Objective:     General appearance: Appears stated age  Head: Normocephalic, without obvious abnormality, atraumatic  Eyes: conj clear, EOMi PEERLA  Ears: normal TM's and external ear canals both ears  Nose: Nares normal. Septum midline. Mucosa normal. No drainage or sinus tenderness.  Throat: lips, mucosa, and tongue normal; teeth and gums normal  Neck: no adenopathy, no carotid bruit, no JVD, supple, symmetrical, trachea midline and thyroid not enlarged, symmetric, no tenderness/mass/nodules  No CVA tenderness.  Lungs: clear to auscultation bilaterally  Breasts: normal appearance, no masses or tenderness, top of the pacemaker on left upper chest. Incision well-healed. It is tender.  Heart: regular rate and rhythm, S1, S2 normal, no murmur, click, rub or gallop  Abdomen: soft, non-tender; bowel sounds normal; no masses, no organomegaly  Musculoskeletal: ROM normal in all joints, no crepitus, no deformity, Normal muscle strengthen. Back  is symmetric, no curvature. Skin: Skin color, texture, turgor normal. No rashes or lesions  Lymph nodes: Cervical, supraclavicular, and axillary nodes normal.  Neurologic: CN 2 -12 Normal,  Normal symmetric reflexes. Normal coordination and gait  Psych: Alert & Oriented x 3, Mood appear stable.    Assessment:    Annual wellness medicare exam   Plan:    During the course of the visit the patient was educated and counseled about appropriate screening and preventive services including:   Annual mammogram  Declines bone density study   Patient Instructions (the written plan) was given to the patient.  Medicare Attestation  I have personally reviewed:  The patient's medical and social history  Their use of alcohol, tobacco or illicit drugs  Their current  medications and supplements  The patient's functional ability including ADLs,fall risks, home safety risks, cognitive, and hearing and visual impairment  Diet and physical activities  Evidence for depression or mood disorders  The patient's weight, height, BMI, and visual acuity have been recorded in the chart. I have made referrals, counseling, and provided education to the patient based on review of the above and I have provided the patient with a written personalized care plan for preventive services.

## 2015-09-13 LAB — HEPATITIS C ANTIBODY: HCV Ab: NEGATIVE

## 2015-09-27 DIAGNOSIS — Z1589 Genetic susceptibility to other disease: Secondary | ICD-10-CM | POA: Insufficient documentation

## 2015-09-27 DIAGNOSIS — D473 Essential (hemorrhagic) thrombocythemia: Secondary | ICD-10-CM | POA: Insufficient documentation

## 2015-09-27 NOTE — Patient Instructions (Signed)
It was a pleasure to see you today. Return in one year or as needed. Continue same medications. 

## 2015-10-01 ENCOUNTER — Ambulatory Visit (HOSPITAL_BASED_OUTPATIENT_CLINIC_OR_DEPARTMENT_OTHER): Payer: Medicare Other | Admitting: Oncology

## 2015-10-01 ENCOUNTER — Telehealth: Payer: Self-pay | Admitting: Oncology

## 2015-10-01 ENCOUNTER — Other Ambulatory Visit (HOSPITAL_BASED_OUTPATIENT_CLINIC_OR_DEPARTMENT_OTHER): Payer: Medicare Other

## 2015-10-01 VITALS — BP 134/43 | HR 63 | Temp 98.0°F | Resp 18 | Ht 63.0 in | Wt 138.0 lb

## 2015-10-01 DIAGNOSIS — D75839 Thrombocytosis, unspecified: Secondary | ICD-10-CM

## 2015-10-01 DIAGNOSIS — D473 Essential (hemorrhagic) thrombocythemia: Secondary | ICD-10-CM

## 2015-10-01 LAB — COMPREHENSIVE METABOLIC PANEL
ALBUMIN: 3.8 g/dL (ref 3.5–5.0)
ALK PHOS: 56 U/L (ref 40–150)
ALT: 18 U/L (ref 0–55)
AST: 24 U/L (ref 5–34)
Anion Gap: 7 mEq/L (ref 3–11)
BILIRUBIN TOTAL: 0.54 mg/dL (ref 0.20–1.20)
BUN: 12.2 mg/dL (ref 7.0–26.0)
CO2: 25 mEq/L (ref 22–29)
Calcium: 9 mg/dL (ref 8.4–10.4)
Chloride: 108 mEq/L (ref 98–109)
Creatinine: 0.9 mg/dL (ref 0.6–1.1)
EGFR: 61 mL/min/{1.73_m2} — AB (ref >90)
GLUCOSE: 89 mg/dL (ref 70–140)
Potassium: 4.3 mEq/L (ref 3.5–5.1)
SODIUM: 141 meq/L (ref 136–145)
TOTAL PROTEIN: 6.6 g/dL (ref 6.4–8.3)

## 2015-10-01 LAB — CBC WITH DIFFERENTIAL/PLATELET
BASO%: 1 % (ref 0.0–2.0)
Basophils Absolute: 0.1 10*3/uL (ref 0.0–0.1)
EOS ABS: 0.1 10*3/uL (ref 0.0–0.5)
EOS%: 1.2 % (ref 0.0–7.0)
HCT: 45.9 % (ref 34.8–46.6)
HEMOGLOBIN: 15.3 g/dL (ref 11.6–15.9)
LYMPH%: 29.7 % (ref 14.0–49.7)
MCH: 37.5 pg — ABNORMAL HIGH (ref 25.1–34.0)
MCHC: 33.3 g/dL (ref 31.5–36.0)
MCV: 112.8 fL — AB (ref 79.5–101.0)
MONO#: 0.5 10*3/uL (ref 0.1–0.9)
MONO%: 8.1 % (ref 0.0–14.0)
NEUT%: 60 % (ref 38.4–76.8)
NEUTROS ABS: 4 10*3/uL (ref 1.5–6.5)
Platelets: 601 10*3/uL — ABNORMAL HIGH (ref 145–400)
RBC: 4.07 10*6/uL (ref 3.70–5.45)
RDW: 14.1 % (ref 11.2–14.5)
WBC: 6.7 10*3/uL (ref 3.9–10.3)
lymph#: 2 10*3/uL (ref 0.9–3.3)

## 2015-10-01 NOTE — Progress Notes (Signed)
Hematology and Oncology Follow Up Visit  Anna Fuller [id number] [birth year] 70 y.o. 10/01/2015 9:02 AM      Principle Diagnosis: 70 year old female with essential thrombocythemia.  She has JAK2 positive myeloproliferative disorder diagnosed 2007.  Prior Therapy: 1. She is status post anagrelide therapy discontinued due to do intolerance. 2. Status post bone marrow biopsy in August 2008 which showed early signs of myeloproliferative disorder.  Current therapy: She is on hydroxyurea alternating 1000 and 1500 mg every other day. She has been treated with hydroxyurea since 2009.   Interim History: Anna Fuller presents today for a followup visit. Since the last visit, she reports no new complaints. She recently traveled on a cruise in [place] and had no health issues before or after. She reports that numbness any doses of hydroxyurea. Denied any recent illness or infections. She continues to have scalp numbness which has not changed dramatically. She denies any interference with her activity level or any neurological symptoms. It's not associated with any visual disturbances or any deficits. She remains compliant with her hydroxyurea despite the symptoms.  She has not reported any recent bleeding or clotting tendencies.She continues to perform activities of daily living without any decline.   She reports no blurry vision. No report any syncope or seizures. She is not reporting any fevers or chills. She has reported no lymphadenopathy. She had not reported any oral ulcers. She is not reporting no bleeding or thrombotic events noted. No bleeding or clotting complications. Has not reported any chest pain or difficulty breathing. Has not reported any vomiting but does report occasional nausea. Her rest of her review of system is unremarkable.  Medications: I have reviewed the patient's current medications. Current Outpatient Prescriptions  Medication Sig Dispense Refill  . aspirin 81 MG tablet Take  81 mg by mouth daily.        . cholecalciferol (VITAMIN D) 1000 UNITS tablet Take 2,000 Units by mouth daily.      Marland Kitchen estrogens, conjugated, (PREMARIN) 0.3 MG tablet Take 1 tablet (0.3 mg total) by mouth daily. Take daily for 21 days then do not take for 7 days.  30 tablet  11  . hydroxyurea (HYDREA) 500 MG capsule Take one tab twice a day.  Every other night take an additional tablet at night.  (Alternate 1064m and 15025m.  120 capsule  3  . simvastatin (ZOCOR) 40 MG tablet Take 1 tablet (40 mg total) by mouth at bedtime.  30 tablet  11       Allergies: Reviewed with the patient again today. Allergies  Allergen Reactions  . Codeine Nausea And Vomiting  . Iodine Anaphylaxis    Contrast dye  . Penicillins      Physical Exam:  Blood pressure 134/43, pulse 63, temperature 98 F (36.7 C), temperature source Oral, resp. rate 18, height _0  (1.6 m), weight 138 lb (62.596 kg), SpO2 100 %. ECOG: 1 General appearance: alert oriented. well-appearing woman without distress. Head: Normocephalic, without obvious abnormality. No scalp lesions noted.  Neck: no adenopathy, no carotid bruit, no JVD, supple. No thyromegaly. Lymph nodes: Cervical, supraclavicular, and axillary nodes normal.  Heart:regular rate and rhythm, S1, S2 normal.  Lung:chest clear, no wheezing, rales. No dullness to percussion. Abdomin: soft, non-tender, without masses or organomegaly EXT:no erythema, induration, or nodules Neurological examination: Showed no deficits. Gait is normal. Skin examination: No rashes or lesions.  Lab Results: Lab Results  Component Value Date   WBC 6.7 10/01/2015   HGB 15.3 10/01/2015  HCT 45.9 10/01/2015   MCV 112.8* 10/01/2015   PLT 601* 10/01/2015     Chemistry      Component Value Date/Time   NA 138 09/11/2015 0856   NA 140 07/30/2015 0811   K 4.9 09/11/2015 0856   K 3.6 07/30/2015 0811   CL 103 09/11/2015 0856   CL 105 01/30/2013 0825   CO2 26 09/11/2015 0856   CO2 25  07/30/2015 0811   BUN 16 09/11/2015 0856   BUN 9.8 07/30/2015 0811   CREATININE 0.89 09/11/2015 0856   CREATININE 0.9 07/30/2015 0811   CREATININE 0.94 04/11/2012 0839      Component Value Date/Time   CALCIUM 9.3 09/11/2015 0856   CALCIUM 9.2 07/30/2015 0811   ALKPHOS 56 09/11/2015 0856   ALKPHOS 60 07/30/2015 0811   AST 21 09/11/2015 0856   AST 23 07/30/2015 0811   ALT 13 09/11/2015 0856   ALT 14 07/30/2015 0811   BILITOT 0.6 09/11/2015 0856   BILITOT 0.53 07/30/2015 0811      Impression and Plan:  This is a pleasant 70 year old female with the following issues.  1. Myeloproliferative disorder presenting with essential thrombocythemia and JAK-2 positive mutation.  She is currently on hydroxyurea alternating between 1000 and 1500 mg.  Platelet count today is 601 which remians under control. She has had that frequent fluctuations around the 600 range last 2 years. The plan is to continue with the current dose and schedule without any modification. We will consider change the dose if she has persistent elevation of platelet count above 800.  2. Thrombosis prophylaxis. Continue to be on aspirin. She has no thrombosis history puts her risk for future thrombosis to be low. 3. Scalp numbness: Unclear etiology at this time could be related to hydroxyurea. No major clinical changes. 4. Hematuria: This have resolved at this time. 5. Follow-up: Will be in 2 months.        [facility], MD 1/4/20179:02 AM

## 2015-10-01 NOTE — Telephone Encounter (Signed)
Gave patient avs report and appointments for March  °

## 2015-11-26 ENCOUNTER — Other Ambulatory Visit (HOSPITAL_BASED_OUTPATIENT_CLINIC_OR_DEPARTMENT_OTHER): Payer: Medicare Other

## 2015-11-26 ENCOUNTER — Telehealth: Payer: Self-pay | Admitting: Oncology

## 2015-11-26 ENCOUNTER — Ambulatory Visit (HOSPITAL_BASED_OUTPATIENT_CLINIC_OR_DEPARTMENT_OTHER): Payer: Medicare Other | Admitting: Oncology

## 2015-11-26 VITALS — BP 117/49 | HR 72 | Temp 98.3°F | Resp 18 | Ht 63.0 in | Wt 138.0 lb

## 2015-11-26 DIAGNOSIS — Z7982 Long term (current) use of aspirin: Secondary | ICD-10-CM | POA: Diagnosis not present

## 2015-11-26 DIAGNOSIS — D473 Essential (hemorrhagic) thrombocythemia: Secondary | ICD-10-CM

## 2015-11-26 LAB — CBC WITH DIFFERENTIAL/PLATELET
BASO%: 1 % (ref 0.0–2.0)
BASOS ABS: 0.1 10*3/uL (ref 0.0–0.1)
EOS ABS: 0.1 10*3/uL (ref 0.0–0.5)
EOS%: 1.3 % (ref 0.0–7.0)
HEMATOCRIT: 46.7 % — AB (ref 34.8–46.6)
HEMOGLOBIN: 15.9 g/dL (ref 11.6–15.9)
LYMPH#: 2.4 10*3/uL (ref 0.9–3.3)
LYMPH%: 30.1 % (ref 14.0–49.7)
MCH: 37.7 pg — AB (ref 25.1–34.0)
MCHC: 34 g/dL (ref 31.5–36.0)
MCV: 110.7 fL — AB (ref 79.5–101.0)
MONO#: 0.8 10*3/uL (ref 0.1–0.9)
MONO%: 10 % (ref 0.0–14.0)
NEUT#: 4.5 10*3/uL (ref 1.5–6.5)
NEUT%: 57.6 % (ref 38.4–76.8)
PLATELETS: 613 10*3/uL — AB (ref 145–400)
RBC: 4.22 10*6/uL (ref 3.70–5.45)
RDW: 13.6 % (ref 11.2–14.5)
WBC: 7.9 10*3/uL (ref 3.9–10.3)

## 2015-11-26 LAB — COMPREHENSIVE METABOLIC PANEL
ALBUMIN: 4.1 g/dL (ref 3.5–5.0)
ALK PHOS: 65 U/L (ref 40–150)
ALT: 16 U/L (ref 0–55)
ANION GAP: 9 meq/L (ref 3–11)
AST: 24 U/L (ref 5–34)
BUN: 11 mg/dL (ref 7.0–26.0)
CALCIUM: 9.4 mg/dL (ref 8.4–10.4)
CHLORIDE: 106 meq/L (ref 98–109)
CO2: 26 mEq/L (ref 22–29)
Creatinine: 1.1 mg/dL (ref 0.6–1.1)
EGFR: 51 mL/min/{1.73_m2} — AB (ref >90)
Glucose: 87 mg/dl (ref 70–140)
POTASSIUM: 3.8 meq/L (ref 3.5–5.1)
Sodium: 141 mEq/L (ref 136–145)
Total Bilirubin: 0.48 mg/dL (ref 0.20–1.20)
Total Protein: 7 g/dL (ref 6.4–8.3)

## 2015-11-26 MED ORDER — HYDROXYUREA 500 MG PO CAPS: ORAL_CAPSULE | ORAL | Status: DC

## 2015-11-26 NOTE — Telephone Encounter (Signed)
appt made and avs printed °

## 2015-11-26 NOTE — Progress Notes (Signed)
Hematology and Oncology Follow Up Visit  Anna Fuller [id number] 1946-06-03 70 y.o. 11/26/2015 8:17 AM      Principle Diagnosis: 70 year old female with essential thrombocythemia.  She has JAK2 positive myeloproliferative disorder diagnosed 2007.  Prior Therapy: 1. She is status post anagrelide therapy discontinued due to do intolerance. 2. Status post bone marrow biopsy in August 2008 which showed early signs of myeloproliferative disorder.  Current therapy: She is on hydroxyurea alternating 1000 and 1500 mg every other day. She has been treated with hydroxyurea since 2009.   Interim History: Anna Fuller presents today for a followup visit. Since the last visit, she reports doing well without any changes. She has been off Premarin and have done well off of it. She does not report any recent infections or hospitalizations. She denied any cold or flu symptoms. She continues to be active and performs activities of daily living without any decline.   She reports that numbness any doses of hydroxyurea. She continues to have scalp numbness which has not changed dramatically. She denies any interference with her activity level or any neurological symptoms. It's not associated with any visual disturbances or any deficits. She has not reported any recent bleeding or clotting tendencies.  She reports no blurry vision. No report any syncope or seizures. She is not reporting any fevers or chills. She has reported no lymphadenopathy. She had not reported any oral ulcers. Has not reported any chest pain or difficulty breathing. Has not reported any vomiting but does report occasional nausea. Her rest of her review of system is unremarkable.  Medications: I have reviewed the patient's current medications. Current Outpatient Prescriptions  Medication Sig Dispense Refill  . aspirin 81 MG tablet Take 81 mg by mouth daily.        . cholecalciferol (VITAMIN D) 1000 UNITS tablet Take 2,000 Units by mouth  daily.      Marland Kitchen estrogens, conjugated, (PREMARIN) 0.3 MG tablet Take 1 tablet (0.3 mg total) by mouth daily. Take daily for 21 days then do not take for 7 days.  30 tablet  11  . hydroxyurea (HYDREA) 500 MG capsule Take one tab twice a day.  Every other night take an additional tablet at night.  (Alternate 1092m and 15075m.  120 capsule  3  . simvastatin (ZOCOR) 40 MG tablet Take 1 tablet (40 mg total) by mouth at bedtime.  30 tablet  11       Allergies: Reviewed with the patient again today. Allergies  Allergen Reactions  . Codeine Nausea And Vomiting  . Iodine Anaphylaxis    Contrast dye  . Penicillins      Physical Exam:  Blood pressure 117/49, pulse 72, temperature 98.3 F (36.8 C), temperature source Oral, resp. rate 18, height 5' 3"  (1.6 m), weight 138 lb (62.596 kg), SpO2 100 %. ECOG: 1 General appearance: alert oriented. Well-appearing without distress. Head: Normocephalic, without obvious abnormality. No scalp lesions noted.  Neck: no thyroid masses or lymphadenopathy. Lymph nodes: Cervical, supraclavicular, and axillary nodes normal.  Heart:regular rate and rhythm, S1, S2 normal. No murmurs rubs or gallops. Lung:chest clear, no wheezing, rales. No dullness to percussion. Abdomin: soft, non-tender, without masses or organomegaly shifting dullness or ascites. EXT:no erythema, induration, or nodules. No edema Neurological examination: Showed no motor, sensory deficits. Normal gait. Skin examination: No rashes or lesions.  Lab Results: Lab Results  Component Value Date   WBC 7.9 11/26/2015   HGB 15.9 11/26/2015   HCT 46.7* 11/26/2015   MCV  110.7* 11/26/2015   PLT 613* 11/26/2015     Chemistry      Component Value Date/Time   NA 141 10/01/2015 0832   NA 138 09/11/2015 0856   K 4.3 10/01/2015 0832   K 4.9 09/11/2015 0856   CL 103 09/11/2015 0856   CL 105 01/30/2013 0825   CO2 25 10/01/2015 0832   CO2 26 09/11/2015 0856   BUN 12.2 10/01/2015 0832   BUN 16  09/11/2015 0856   CREATININE 0.9 10/01/2015 0832   CREATININE 0.89 09/11/2015 0856   CREATININE 0.94 04/11/2012 0839      Component Value Date/Time   CALCIUM 9.0 10/01/2015 0832   CALCIUM 9.3 09/11/2015 0856   ALKPHOS 56 10/01/2015 0832   ALKPHOS 56 09/11/2015 0856   AST 24 10/01/2015 0832   AST 21 09/11/2015 0856   ALT 18 10/01/2015 0832   ALT 13 09/11/2015 0856   BILITOT 0.54 10/01/2015 0832   BILITOT 0.6 09/11/2015 0856      Impression and Plan:  This is a pleasant 70 year old female with the following issues.  1. Myeloproliferative disorder presenting with essential thrombocythemia and JAK-2 positive mutation.  She is currently on hydroxyurea alternating between 1000 and 1500 mg.  Platelet count today is 613 and she is asymptomatic. The plan is to continue with the same dose and schedule and continue to monitor her counts closely. I will consider changing the dose of her platelet counts increases above 800.  2. Thrombosis prophylaxis. Continue to be on aspirin. She has no thrombosis history puts her risk for future thrombosis to be low. 3. Scalp numbness: Unclear etiology at this time could be related to hydroxyurea. She continues to be clinically stable and unchanged from previous visits. 4. Age-appropriate cancer screening: She is up-to-date at this time. 5. Follow-up: Will be in 2 months.        [facility], MD 3/1/20178:17 AM

## 2016-01-27 DIAGNOSIS — H353131 Nonexudative age-related macular degeneration, bilateral, early dry stage: Secondary | ICD-10-CM | POA: Diagnosis not present

## 2016-01-27 DIAGNOSIS — Z0100 Encounter for examination of eyes and vision without abnormal findings: Secondary | ICD-10-CM | POA: Diagnosis not present

## 2016-01-28 ENCOUNTER — Ambulatory Visit (HOSPITAL_BASED_OUTPATIENT_CLINIC_OR_DEPARTMENT_OTHER): Payer: Medicare Other | Admitting: Oncology

## 2016-01-28 ENCOUNTER — Other Ambulatory Visit (HOSPITAL_BASED_OUTPATIENT_CLINIC_OR_DEPARTMENT_OTHER): Payer: Medicare Other

## 2016-01-28 ENCOUNTER — Telehealth: Payer: Self-pay | Admitting: Oncology

## 2016-01-28 VITALS — BP 116/54 | HR 63 | Temp 98.4°F | Resp 18 | Wt 135.6 lb

## 2016-01-28 DIAGNOSIS — D473 Essential (hemorrhagic) thrombocythemia: Secondary | ICD-10-CM | POA: Diagnosis not present

## 2016-01-28 LAB — CBC WITH DIFFERENTIAL/PLATELET
BASO%: 0.8 % (ref 0.0–2.0)
Basophils Absolute: 0.1 10*3/uL (ref 0.0–0.1)
EOS ABS: 0.1 10*3/uL (ref 0.0–0.5)
EOS%: 1.1 % (ref 0.0–7.0)
HCT: 46.6 % (ref 34.8–46.6)
HGB: 16 g/dL — ABNORMAL HIGH (ref 11.6–15.9)
LYMPH%: 27.2 % (ref 14.0–49.7)
MCH: 37.6 pg — ABNORMAL HIGH (ref 25.1–34.0)
MCHC: 34.3 g/dL (ref 31.5–36.0)
MCV: 109.6 fL — AB (ref 79.5–101.0)
MONO#: 0.7 10*3/uL (ref 0.1–0.9)
MONO%: 9.3 % (ref 0.0–14.0)
NEUT#: 4.4 10*3/uL (ref 1.5–6.5)
NEUT%: 61.6 % (ref 38.4–76.8)
PLATELETS: 594 10*3/uL — AB (ref 145–400)
RBC: 4.25 10*6/uL (ref 3.70–5.45)
RDW: 14.4 % (ref 11.2–14.5)
WBC: 7.2 10*3/uL (ref 3.9–10.3)
lymph#: 2 10*3/uL (ref 0.9–3.3)

## 2016-01-28 LAB — COMPREHENSIVE METABOLIC PANEL
ALT: 19 U/L (ref 0–55)
ANION GAP: 10 meq/L (ref 3–11)
AST: 29 U/L (ref 5–34)
Albumin: 4.2 g/dL (ref 3.5–5.0)
Alkaline Phosphatase: 62 U/L (ref 40–150)
BUN: 8.6 mg/dL (ref 7.0–26.0)
CHLORIDE: 106 meq/L (ref 98–109)
CO2: 25 meq/L (ref 22–29)
Calcium: 9.6 mg/dL (ref 8.4–10.4)
Creatinine: 1.1 mg/dL (ref 0.6–1.1)
EGFR: 50 mL/min/{1.73_m2} — AB (ref >90)
Glucose: 90 mg/dl (ref 70–140)
Potassium: 4.1 mEq/L (ref 3.5–5.1)
SODIUM: 141 meq/L (ref 136–145)
Total Bilirubin: 0.57 mg/dL (ref 0.20–1.20)
Total Protein: 7.1 g/dL (ref 6.4–8.3)

## 2016-01-28 NOTE — Progress Notes (Signed)
Hematology and Oncology Follow Up Visit  Anna Fuller [id number] [birth year] 70 y.o. 01/28/2016 9:02 AM      Principle Diagnosis: 70 year old female with essential thrombocythemia.  She has JAK2 positive myeloproliferative disorder diagnosed 2007.  Prior Therapy: 1. She is status post anagrelide therapy discontinued due to do intolerance. 2. Status post bone marrow biopsy in August 2008 which showed early signs of myeloproliferative disorder.  Current therapy: She is on hydroxyurea alternating 1000 and 1500 mg every other day. She has been treated with hydroxyurea since 2009.   Interim History: Anna Fuller presents today for a followup visit. Since the last visit, she reports Increased her scalp numbness which is aggravating at times. She does not report any neurological symptoms including headaches, blurry vision or change in her mentation. She does not report any visual changes although she had been told that she is developing macular degeneration. She continues to be active and performs activities of daily living without any decline. She has been working outside diligently and has lost some weight because of her increased activity.  She denied any other complications related to hydroxyurea. She denied any mouth sores or ulcers. She denied any thrombotic or bleeding events.  She reports no blurry vision. No report any syncope or seizures. She is not reporting any fevers or chills. She does not report any appetite changes or decline in her performance status. She has reported no lymphadenopathy. She had not reported any oral ulcers. Has not reported any chest pain or difficulty breathing. Has not reported any vomiting but does report occasional nausea. She denied any frequency urgency or urination difficulties. Her rest of her review of system is unremarkable.  Medications: I have reviewed the patient's current medications. Current Outpatient Prescriptions  Medication Sig Dispense Refill   . aspirin 81 MG tablet Take 81 mg by mouth daily.        . cholecalciferol (VITAMIN D) 1000 UNITS tablet Take 2,000 Units by mouth daily.      Marland Kitchen estrogens, conjugated, (PREMARIN) 0.3 MG tablet Take 1 tablet (0.3 mg total) by mouth daily. Take daily for 21 days then do not take for 7 days.  30 tablet  11  . hydroxyurea (HYDREA) 500 MG capsule Take one tab twice a day.  Every other night take an additional tablet at night.  (Alternate 1049m and 15058m.  120 capsule  3  . simvastatin (ZOCOR) 40 MG tablet Take 1 tablet (40 mg total) by mouth at bedtime.  30 tablet  11       Allergies: Reviewed with the patient again today. Allergies  Allergen Reactions  . Codeine Nausea And Vomiting  . Iodine Anaphylaxis    Contrast dye  . Penicillins      Physical Exam:  Blood pressure 116/54, pulse 63, temperature 98.4 F (36.9 C), temperature source Oral, resp. rate 18, weight 135 lb 9 oz (61.491 kg), SpO2 99 %. ECOG: 1 General appearance: Pleasant-appearing woman without distress. Head: Normocephalic, without obvious abnormality. Sclerae anicteric. Oral ulcers without lesions. Neck: no thyroid masses or lymphadenopathy. Lymph nodes: Cervical, supraclavicular, and axillary nodes normal.  Heart:regular rate and rhythm, S1, S2 normal. No murmurs rubs or gallops. Lung:chest clear, no wheezing, rales. No dullness to percussion. Abdomin: soft, non-tender, without masses or organomegaly no rebound or guarding. EXT:no erythema, induration, or nodules. No edema Neurological examination: No deficits noted. Skin examination: No scalp lesions noted.  Lab Results: Lab Results  Component Value Date   WBC 7.2 01/28/2016   HGB  16.0* 01/28/2016   HCT 46.6 01/28/2016   MCV 109.6* 01/28/2016   PLT 594* 01/28/2016     Chemistry      Component Value Date/Time   NA 141 11/26/2015 0742   NA 138 09/11/2015 0856   K 3.8 11/26/2015 0742   K 4.9 09/11/2015 0856   CL 103 09/11/2015 0856   CL 105  01/30/2013 0825   CO2 26 11/26/2015 0742   CO2 26 09/11/2015 0856   BUN 11.0 11/26/2015 0742   BUN 16 09/11/2015 0856   CREATININE 1.1 11/26/2015 0742   CREATININE 0.89 09/11/2015 0856   CREATININE 0.94 04/11/2012 0839      Component Value Date/Time   CALCIUM 9.4 11/26/2015 0742   CALCIUM 9.3 09/11/2015 0856   ALKPHOS 65 11/26/2015 0742   ALKPHOS 56 09/11/2015 0856   AST 24 11/26/2015 0742   AST 21 09/11/2015 0856   ALT 16 11/26/2015 0742   ALT 13 09/11/2015 0856   BILITOT 0.48 11/26/2015 0742   BILITOT 0.6 09/11/2015 0856      Impression and Plan:  This is a pleasant 70 year old female with the following issues.  1. Myeloproliferative disorder presenting with essential thrombocythemia and JAK-2 positive mutation.  She is currently on hydroxyurea alternating between 1000 and 1500 mg.  Platelet count today is 595  and she is asymptomatic. The plan is to continue with the same dose and schedule and continue to monitor her counts closely. I will consider changing the dose of her platelet counts increases above 800. She had poor tolerance to anagrelide in the past and certainly would not tolerate interferon as a salvage therapy. At this point there is no need for any different salvage regimen. 2. Thrombosis prophylaxis. Continue to be on aspirin. She has no thrombosis history and that will decrease her risk of thrombosis moving forward. 3. Scalp numbness: Unclear etiology at this time could be related to hydroxyurea. She continues to be clinically stable. 4. Age-appropriate cancer screening: She is up-to-date at this time. 5. Follow-up: Will be in 2 months.        [facility], MD 5/3/20179:02 AM

## 2016-01-28 NOTE — Telephone Encounter (Signed)
PT REFUSED PRINT OUT OF SCHED & AVS... PT AWARE OF APPT

## 2016-03-24 ENCOUNTER — Ambulatory Visit (HOSPITAL_BASED_OUTPATIENT_CLINIC_OR_DEPARTMENT_OTHER): Payer: Medicare Other | Admitting: Oncology

## 2016-03-24 ENCOUNTER — Other Ambulatory Visit (HOSPITAL_BASED_OUTPATIENT_CLINIC_OR_DEPARTMENT_OTHER): Payer: Medicare Other

## 2016-03-24 ENCOUNTER — Telehealth: Payer: Self-pay | Admitting: Oncology

## 2016-03-24 VITALS — BP 112/50 | HR 67 | Temp 98.2°F | Resp 18 | Ht 63.0 in | Wt 136.9 lb

## 2016-03-24 DIAGNOSIS — Z7982 Long term (current) use of aspirin: Secondary | ICD-10-CM

## 2016-03-24 DIAGNOSIS — D473 Essential (hemorrhagic) thrombocythemia: Secondary | ICD-10-CM | POA: Diagnosis not present

## 2016-03-24 DIAGNOSIS — M898X9 Other specified disorders of bone, unspecified site: Secondary | ICD-10-CM

## 2016-03-24 LAB — CBC WITH DIFFERENTIAL/PLATELET
BASO%: 0.6 % (ref 0.0–2.0)
Basophils Absolute: 0.1 10*3/uL (ref 0.0–0.1)
EOS ABS: 0.1 10*3/uL (ref 0.0–0.5)
EOS%: 1.6 % (ref 0.0–7.0)
HCT: 45.1 % (ref 34.8–46.6)
HGB: 15.2 g/dL (ref 11.6–15.9)
LYMPH%: 29.5 % (ref 14.0–49.7)
MCH: 36.8 pg — AB (ref 25.1–34.0)
MCHC: 33.7 g/dL (ref 31.5–36.0)
MCV: 109.2 fL — AB (ref 79.5–101.0)
MONO#: 0.7 10*3/uL (ref 0.1–0.9)
MONO%: 9.2 % (ref 0.0–14.0)
NEUT%: 59.1 % (ref 38.4–76.8)
NEUTROS ABS: 4.7 10*3/uL (ref 1.5–6.5)
Platelets: 607 10*3/uL — ABNORMAL HIGH (ref 145–400)
RBC: 4.13 10*6/uL (ref 3.70–5.45)
RDW: 15 % — ABNORMAL HIGH (ref 11.2–14.5)
WBC: 7.9 10*3/uL (ref 3.9–10.3)
lymph#: 2.3 10*3/uL (ref 0.9–3.3)

## 2016-03-24 LAB — COMPREHENSIVE METABOLIC PANEL
ALT: 22 U/L (ref 0–55)
ANION GAP: 11 meq/L (ref 3–11)
AST: 27 U/L (ref 5–34)
Albumin: 4 g/dL (ref 3.5–5.0)
Alkaline Phosphatase: 73 U/L (ref 40–150)
BUN: 12.6 mg/dL (ref 7.0–26.0)
CHLORIDE: 104 meq/L (ref 98–109)
CO2: 26 meq/L (ref 22–29)
Calcium: 9.4 mg/dL (ref 8.4–10.4)
Creatinine: 1 mg/dL (ref 0.6–1.1)
EGFR: 57 mL/min/{1.73_m2} — AB (ref >90)
Glucose: 91 mg/dl (ref 70–140)
POTASSIUM: 4.4 meq/L (ref 3.5–5.1)
SODIUM: 140 meq/L (ref 136–145)
TOTAL PROTEIN: 6.9 g/dL (ref 6.4–8.3)
Total Bilirubin: 0.43 mg/dL (ref 0.20–1.20)

## 2016-03-24 NOTE — Telephone Encounter (Signed)
Gave and printed appt sched and avs for pt for Sept °

## 2016-03-24 NOTE — Progress Notes (Signed)
Hematology and Oncology Follow Up Visit  Anna Fuller [id number] [birth year] 70 y.o. 03/24/2016 8:16 AM      Principle Diagnosis: 70 year old female with essential thrombocythemia.  She has JAK2 positive myeloproliferative disorder diagnosed 2007.  Prior Therapy: 1. She is status post anagrelide therapy discontinued due to do intolerance. 2. Status post bone marrow biopsy in August 2008 which showed early signs of myeloproliferative disorder.  Current therapy: She is on hydroxyurea alternating 1000 and 1500 mg every other day. She has been treated with hydroxyurea since 2009.   Interim History: Anna Fuller presents today for a followup visit. Since the last visit, she reports more symptoms of night sweats and bone pain. She reports the symptoms have been periodic in nature for the last 6 months but more noticeable recently. Her appetite is about the same and denied any gross satiety. Her weights about the same. She denied any fevers or chills. She still performs activities of daily living without any decline. She reports he night sweats are interfering with her sleep but during the day she is completely functional. She has not taken any medication for her bone pain which have been periodic in nature.   She does not report any neurological symptoms including headaches, blurry vision or change in her mentation.  She denied any other complications related to hydroxyurea. She denied any mouth sores or ulcers. She denied any thrombotic or bleeding events.  She reports no blurry vision. No report any syncope or seizures. She is not reporting any fevers or chills. She does not report any appetite changes or decline in her performance status. She has reported no lymphadenopathy. She had not reported any oral ulcers. Has not reported any chest pain or difficulty breathing. Has not reported any vomiting. She denied any frequency urgency or urination difficulties. Her rest of her review of system is  unremarkable.  Medications: I have reviewed the patient's current medications. Current Outpatient Prescriptions  Medication Sig Dispense Refill  . aspirin 81 MG tablet Take 81 mg by mouth daily.        . cholecalciferol (VITAMIN D) 1000 UNITS tablet Take 2,000 Units by mouth daily.      Marland Kitchen estrogens, conjugated, (PREMARIN) 0.3 MG tablet Take 1 tablet (0.3 mg total) by mouth daily. Take daily for 21 days then do not take for 7 days.  30 tablet  11  . hydroxyurea (HYDREA) 500 MG capsule Take one tab twice a day.  Every other night take an additional tablet at night.  (Alternate 1075m and 15062m.  120 capsule  3  . simvastatin (ZOCOR) 40 MG tablet Take 1 tablet (40 mg total) by mouth at bedtime.  30 tablet  11       Allergies: Reviewed with the patient again today. Allergies  Allergen Reactions  . Codeine Nausea And Vomiting  . Iodine Anaphylaxis    Contrast dye  . Penicillins      Physical Exam:  Blood pressure 112/50, pulse 67, temperature 98.2 F (36.8 C), temperature source Oral, resp. rate 18, height _0  (1.6 m), weight 136 lb 14.4 oz (62.097 kg), SpO2 100 %. ECOG: 1 General appearance: , Awake woman without distress. Head:  Sclerae anicteric. Oral ulcers without lesions. Neck: no thyroid masses or lymphadenopathy. Lymph nodes: Cervical, supraclavicular, and axillary nodes normal.  Heart:regular rate and rhythm, S1, S2 normal. No murmurs rubs or gallops. Lung:chest clear, no wheezing, rales. No dullness to percussion. Abdomin: Soft, nontender without rebound or guarding. No splenomegaly palpated. EXT:no  erythema, induration, or nodules. No edema Neurological examination: No deficits noted. Skin examination: No scalp lesions noted.  Lab Results: Lab Results  Component Value Date   WBC 7.9 03/24/2016   HGB 15.2 03/24/2016   HCT 45.1 03/24/2016   MCV 109.2* 03/24/2016   PLT 607* 03/24/2016     Chemistry      Component Value Date/Time   NA 141 01/28/2016 0829    NA 138 09/11/2015 0856   K 4.1 01/28/2016 0829   K 4.9 09/11/2015 0856   CL 103 09/11/2015 0856   CL 105 01/30/2013 0825   CO2 25 01/28/2016 0829   CO2 26 09/11/2015 0856   BUN 8.6 01/28/2016 0829   BUN 16 09/11/2015 0856   CREATININE 1.1 01/28/2016 0829   CREATININE 0.89 09/11/2015 0856   CREATININE 0.94 04/11/2012 0839      Component Value Date/Time   CALCIUM 9.6 01/28/2016 0829   CALCIUM 9.3 09/11/2015 0856   ALKPHOS 62 01/28/2016 0829   ALKPHOS 56 09/11/2015 0856   AST 29 01/28/2016 0829   AST 21 09/11/2015 0856   ALT 19 01/28/2016 0829   ALT 13 09/11/2015 0856   BILITOT 0.57 01/28/2016 0829   BILITOT 0.6 09/11/2015 0856      Impression and Plan:  This is a pleasant 70 year old female with the following issues.  1. Myeloproliferative disorder presenting with essential thrombocythemia and JAK-2 positive mutation.  She is currently on hydroxyurea alternating between 1000 and 1500 mg.  Platelet count today is 607 and remains relatively stable. She is experiencing some constitutional symptoms including night sweats and bone pain. Her CBC has not changed in the last few years. Her weight have been relatively stable and no evidence of splenomegaly. It is difficult to attribute her symptoms to progressive multiple disorder given her laboratory testing and physical exam. Having said that, if she continues to have the symptoms we will need to stage her with CT scan and repeat bone marrow biopsy. These were discussed today and she is agreeable to do so if the symptoms persist in 2 months. 2. Thrombosis prophylaxis. Continue to be on aspirin. Her thrombosis risk is low given the lack of previous thrombosis episodes. 3. Scalp numbness: Unclear etiology at this time could be related to hydroxyurea. She continues to be clinically stable. 4. Age-appropriate cancer screening: She is up-to-date at this time. 5. Follow-up: Will be in 2 months.        [facility], MD 6/28/20178:16 AM

## 2016-05-10 DIAGNOSIS — Z23 Encounter for immunization: Secondary | ICD-10-CM | POA: Diagnosis not present

## 2016-06-03 ENCOUNTER — Other Ambulatory Visit (HOSPITAL_BASED_OUTPATIENT_CLINIC_OR_DEPARTMENT_OTHER): Payer: Medicare Other

## 2016-06-03 ENCOUNTER — Ambulatory Visit (HOSPITAL_BASED_OUTPATIENT_CLINIC_OR_DEPARTMENT_OTHER): Payer: Medicare Other | Admitting: Oncology

## 2016-06-03 ENCOUNTER — Telehealth: Payer: Self-pay | Admitting: Oncology

## 2016-06-03 VITALS — BP 125/60 | HR 66 | Temp 98.4°F | Resp 18 | Wt 137.1 lb

## 2016-06-03 DIAGNOSIS — Z7982 Long term (current) use of aspirin: Secondary | ICD-10-CM

## 2016-06-03 DIAGNOSIS — M898X9 Other specified disorders of bone, unspecified site: Secondary | ICD-10-CM

## 2016-06-03 DIAGNOSIS — D473 Essential (hemorrhagic) thrombocythemia: Secondary | ICD-10-CM

## 2016-06-03 LAB — CBC WITH DIFFERENTIAL/PLATELET
BASO%: 1.3 % (ref 0.0–2.0)
BASOS ABS: 0.1 10*3/uL (ref 0.0–0.1)
EOS ABS: 0.1 10*3/uL (ref 0.0–0.5)
EOS%: 1.6 % (ref 0.0–7.0)
HEMATOCRIT: 47.1 % — AB (ref 34.8–46.6)
HEMOGLOBIN: 15.9 g/dL (ref 11.6–15.9)
LYMPH#: 2 10*3/uL (ref 0.9–3.3)
LYMPH%: 26.1 % (ref 14.0–49.7)
MCH: 36.6 pg — AB (ref 25.1–34.0)
MCHC: 33.7 g/dL (ref 31.5–36.0)
MCV: 108.7 fL — AB (ref 79.5–101.0)
MONO#: 0.6 10*3/uL (ref 0.1–0.9)
MONO%: 7.6 % (ref 0.0–14.0)
NEUT#: 4.9 10*3/uL (ref 1.5–6.5)
NEUT%: 63.4 % (ref 38.4–76.8)
PLATELETS: 769 10*3/uL — AB (ref 145–400)
RBC: 4.33 10*6/uL (ref 3.70–5.45)
RDW: 14.1 % (ref 11.2–14.5)
WBC: 7.8 10*3/uL (ref 3.9–10.3)

## 2016-06-03 LAB — COMPREHENSIVE METABOLIC PANEL
ALBUMIN: 4 g/dL (ref 3.5–5.0)
ALK PHOS: 80 U/L (ref 40–150)
ALT: 26 U/L (ref 0–55)
AST: 32 U/L (ref 5–34)
Anion Gap: 10 mEq/L (ref 3–11)
BILIRUBIN TOTAL: 0.5 mg/dL (ref 0.20–1.20)
BUN: 13.1 mg/dL (ref 7.0–26.0)
CALCIUM: 9.7 mg/dL (ref 8.4–10.4)
CO2: 25 mEq/L (ref 22–29)
Chloride: 105 mEq/L (ref 98–109)
Creatinine: 0.9 mg/dL (ref 0.6–1.1)
EGFR: 61 mL/min/{1.73_m2} — AB (ref >90)
Glucose: 89 mg/dl (ref 70–140)
POTASSIUM: 4.4 meq/L (ref 3.5–5.1)
Sodium: 140 mEq/L (ref 136–145)
TOTAL PROTEIN: 7.2 g/dL (ref 6.4–8.3)

## 2016-06-03 NOTE — Progress Notes (Signed)
Hematology and Oncology Follow Up Visit  Anna Fuller [id number] 1946-09-27 70 y.o. 06/03/2016 11:57 AM      Principle Diagnosis: 70 year old female with essential thrombocythemia.  She has JAK2 positive myeloproliferative disorder diagnosed 2007.  Prior Therapy: 1. She is status post anagrelide therapy discontinued due to do intolerance. 2. Status post bone marrow biopsy in August 2008 which showed early signs of myeloproliferative disorder.  Current therapy: She is on hydroxyurea alternating 1000 and 1500 mg every other day. She has been treated with hydroxyurea since 2009.   Interim History: Anna Fuller presents today for a followup visit. Since the last visit, she reports No major changes in her health. She continues to report periodic bone pain and night sweats have not changed dramatically. Her appetite is about the same and denied any early satiety. Her weights about the same. She denied any fevers or chills. She still performs activities of daily living without any decline. Her quality of life have not dramatically changed. She is able to attend to her yard without any decline inability to do so. She has reported some insomnia as well  She continues to take hydroxyurea without any major complications. She reports that dose adjustments in the past have caused her nausea but is accustomed to it at this time.  She reports no blurry vision. No report any syncope or seizures. She is not reporting any fevers or chills. She does not report any appetite changes or decline in her performance status. She has reported no lymphadenopathy. She had not reported any oral ulcers. Has not reported any chest pain or difficulty breathing. Has not reported any vomiting. She denied any frequency urgency or urination difficulties. Her rest of her review of system is unremarkable.  Medications: I have reviewed the patient's current medications. Current Outpatient Prescriptions  Medication Sig Dispense  Refill  . aspirin 81 MG tablet Take 81 mg by mouth daily.        . cholecalciferol (VITAMIN D) 1000 UNITS tablet Take 2,000 Units by mouth daily.      Marland Kitchen estrogens, conjugated, (PREMARIN) 0.3 MG tablet Take 1 tablet (0.3 mg total) by mouth daily. Take daily for 21 days then do not take for 7 days.  30 tablet  11  . hydroxyurea (HYDREA) 500 MG capsule Take one tab twice a day.  Every other night take an additional tablet at night.  (Alternate 105m and 1506m.  120 capsule  3  . simvastatin (ZOCOR) 40 MG tablet Take 1 tablet (40 mg total) by mouth at bedtime.  30 tablet  11       Allergies: Reviewed with the patient again today. Allergies  Allergen Reactions  . Codeine Nausea And Vomiting  . Iodine Anaphylaxis    Contrast dye  . Penicillins      Physical Exam:  Blood pressure 125/60, pulse 66, temperature 98.4 F (36.9 C), temperature source Oral, resp. rate 18, weight 137 lb 1.6 oz (62.2 kg), SpO2 99 %. ECOG: 1 General appearance: A well-appearing woman appeared without distress. Head:  Sclerae anicteric. Oral ulcers without lesions. No thrush noted. Neck: no thyroid masses or lymphadenopathy. Lymph nodes: Cervical, supraclavicular, and axillary nodes normal.  Heart:regular rate and rhythm, S1, S2 normal. No murmurs rubs or gallops. Lung:chest clear, no wheezing, rales. No dullness to percussion. Abdomin: Soft, nontender without rebound or guarding. No splenomegaly or shifting dullness EXT:no erythema, induration, or nodules. No edema Neurological examination: No deficits noted. Skin examination: No rashes or lesions noted.  Lab  Results: Lab Results  Component Value Date   WBC 7.8 06/03/2016   HGB 15.9 06/03/2016   HCT 47.1 (H) 06/03/2016   MCV 108.7 (H) 06/03/2016   PLT 769 (H) 06/03/2016     Chemistry      Component Value Date/Time   NA 140 06/03/2016 1058   K 4.4 06/03/2016 1058   CL 103 09/11/2015 0856   CL 105 01/30/2013 0825   CO2 25 06/03/2016 1058   BUN  13.1 06/03/2016 1058   CREATININE 0.9 06/03/2016 1058      Component Value Date/Time   CALCIUM 9.7 06/03/2016 1058   ALKPHOS 80 06/03/2016 1058   AST 32 06/03/2016 1058   ALT 26 06/03/2016 1058   BILITOT 0.50 06/03/2016 1058      Impression and Plan:  This is a pleasant 70 year old female with the following issues.  1. Myeloproliferative disorder presenting with essential thrombocythemia and JAK-2 positive mutation.  She is currently on hydroxyurea alternating between 1000 and 1500 mg.  Platelet count today is more elevated than her baseline. Her count has been consistently around 600 the last 2-1/2 years. We have discussed changing her hydroxyurea dose if her platelet counts continue to increase.  She is experiencing some constitutional symptoms including night sweats and bone pain which have not changed since the last visit. If these persist or increase in intensity, restaging with a bone marrow biopsy and a CT scan may be needed. 2. Thrombosis prophylaxis. Continue to be on aspirin. Her thrombosis risk is low given the lack of previous thrombosis episodes. 3. Bone pain: Likely related to her myeloproliferative disorder. We have discussed different strategies to alleviate that including anti-inflammatory medication and stretching exercise. Anti-JAK 2 agents can be utilized as well for symptomatic relief which could be considered in the future. 4. Age-appropriate cancer screening: She is up-to-date at this time. 5. Follow-up: Will be in 2 months.        [facility], MD 9/7/201711:57 AM

## 2016-06-03 NOTE — Telephone Encounter (Signed)
Patient declined avs report and appt schd printed. Appts conf with patient, per  06/03/16 los.

## 2016-06-10 ENCOUNTER — Other Ambulatory Visit: Payer: Self-pay | Admitting: Internal Medicine

## 2016-06-10 DIAGNOSIS — Z1231 Encounter for screening mammogram for malignant neoplasm of breast: Secondary | ICD-10-CM

## 2016-07-19 ENCOUNTER — Ambulatory Visit
Admission: RE | Admit: 2016-07-19 | Discharge: 2016-07-19 | Disposition: A | Discharge status: Home / Self Care | Payer: Medicare Other | Source: Ambulatory Visit | Attending: Internal Medicine | Admitting: Internal Medicine

## 2016-07-19 DIAGNOSIS — Z1231 Encounter for screening mammogram for malignant neoplasm of breast: Secondary | ICD-10-CM | POA: Diagnosis not present

## 2016-08-05 ENCOUNTER — Telehealth: Payer: Self-pay | Admitting: Oncology

## 2016-08-05 ENCOUNTER — Ambulatory Visit (HOSPITAL_BASED_OUTPATIENT_CLINIC_OR_DEPARTMENT_OTHER): Payer: Medicare Other | Admitting: Oncology

## 2016-08-05 ENCOUNTER — Other Ambulatory Visit (HOSPITAL_BASED_OUTPATIENT_CLINIC_OR_DEPARTMENT_OTHER): Payer: Medicare Other

## 2016-08-05 VITALS — BP 121/52 | HR 65 | Temp 97.7°F | Resp 18 | Ht 63.0 in | Wt 135.3 lb

## 2016-08-05 DIAGNOSIS — D473 Essential (hemorrhagic) thrombocythemia: Secondary | ICD-10-CM | POA: Diagnosis not present

## 2016-08-05 DIAGNOSIS — G893 Neoplasm related pain (acute) (chronic): Secondary | ICD-10-CM

## 2016-08-05 LAB — COMPREHENSIVE METABOLIC PANEL
ALT: 20 U/L (ref 0–55)
AST: 27 U/L (ref 5–34)
Albumin: 4.1 g/dL (ref 3.5–5.0)
Alkaline Phosphatase: 86 U/L (ref 40–150)
Anion Gap: 9 mEq/L (ref 3–11)
BILIRUBIN TOTAL: 0.48 mg/dL (ref 0.20–1.20)
BUN: 11.6 mg/dL (ref 7.0–26.0)
CHLORIDE: 105 meq/L (ref 98–109)
CO2: 25 meq/L (ref 22–29)
CREATININE: 1 mg/dL (ref 0.6–1.1)
Calcium: 9.8 mg/dL (ref 8.4–10.4)
EGFR: 54 mL/min/{1.73_m2} — AB (ref >90)
GLUCOSE: 93 mg/dL (ref 70–140)
Potassium: 4.3 mEq/L (ref 3.5–5.1)
SODIUM: 139 meq/L (ref 136–145)
TOTAL PROTEIN: 7.3 g/dL (ref 6.4–8.3)

## 2016-08-05 LAB — CBC WITH DIFFERENTIAL/PLATELET
BASO%: 2 % (ref 0.0–2.0)
Basophils Absolute: 0.2 10*3/uL — ABNORMAL HIGH (ref 0.0–0.1)
EOS%: 1.9 % (ref 0.0–7.0)
Eosinophils Absolute: 0.2 10*3/uL (ref 0.0–0.5)
HCT: 49.5 % — ABNORMAL HIGH (ref 34.8–46.6)
HGB: 16.5 g/dL — ABNORMAL HIGH (ref 11.6–15.9)
LYMPH%: 24.7 % (ref 14.0–49.7)
MCH: 35.6 pg — ABNORMAL HIGH (ref 25.1–34.0)
MCHC: 33.4 g/dL (ref 31.5–36.0)
MCV: 106.5 fL — ABNORMAL HIGH (ref 79.5–101.0)
MONO#: 0.7 10*3/uL (ref 0.1–0.9)
MONO%: 8.2 % (ref 0.0–14.0)
NEUT%: 63.2 % (ref 38.4–76.8)
NEUTROS ABS: 5.7 10*3/uL (ref 1.5–6.5)
Platelets: 909 10*3/uL — ABNORMAL HIGH (ref 145–400)
RBC: 4.65 10*6/uL (ref 3.70–5.45)
RDW: 14 % (ref 11.2–14.5)
WBC: 8.9 10*3/uL (ref 3.9–10.3)
lymph#: 2.2 10*3/uL (ref 0.9–3.3)

## 2016-08-05 LAB — LACTATE DEHYDROGENASE: LDH: 195 U/L (ref 125–245)

## 2016-08-05 MED ORDER — HYDROXYUREA 500 MG PO CAPS
ORAL_CAPSULE | ORAL | 6 refills | Status: DC
Start: 2016-08-05 — End: 2017-08-02

## 2016-08-05 NOTE — Progress Notes (Signed)
Hematology and Oncology Follow Up Visit  Anna Fuller [id number] 09/01/1946 70 y.o. 08/05/2016 8:44 AM      Principle Diagnosis: 70 year old female with essential thrombocythemia.  She has JAK2 positive myeloproliferative disorder diagnosed 2007.  Prior Therapy: 1. She is status post anagrelide therapy discontinued due to do intolerance. 2. Status post bone marrow biopsy in August 2008 which showed early signs of myeloproliferative disorder.  Current therapy: She has been treated with hydroxyurea since 2009. She is currently taking alternating dosing of 1500 mg and 1000 mg every other day with recommendation to increase her dosing to 1500 mg daily starting on 08/06/2016.  Interim History: Anna Fuller presents today for a followup visit. Since the last visit, she reports feeling reasonably well without any recent complaints. She continues to report periodic bone pain and night sweats but continues to be manageable. Her appetite is about the same but did lose 2 pounds since last visit. She denied any abdominal pain or early satiety.  She denied any fevers or chills. She still performs activities of daily living without any decline. Her quality of life have not dramatically changed. She is able to attend to her yard without any decline inability to do so. She is planning a trip to [place] in the next few weeks.  She continues to take hydroxyurea without any major complications. She reports that dose adjustments in the past have caused her nausea that is overall manageable without any vomiting or abdominal pain.  She reports no blurry vision. No report any syncope or seizures. She is not reporting any fevers or chills. She does not report any appetite changes or decline in her performance status. She has reported no lymphadenopathy. She had not reported any oral ulcers. Has not reported any chest pain or difficulty breathing. Has not reported any vomiting. She denied any frequency urgency or  urination difficulties. Her rest of her review of system is unremarkable.  Medications: I have reviewed the patient's current medications. Current Outpatient Prescriptions  Medication Sig Dispense Refill  . aspirin 81 MG tablet Take 81 mg by mouth daily.        . cholecalciferol (VITAMIN D) 1000 UNITS tablet Take 2,000 Units by mouth daily.      Marland Kitchen estrogens, conjugated, (PREMARIN) 0.3 MG tablet Take 1 tablet (0.3 mg total) by mouth daily. Take daily for 21 days then do not take for 7 days.  30 tablet  11  . hydroxyurea (HYDREA) 500 MG capsule Take one tab twice a day.  Every other night take an additional tablet at night.  (Alternate 1011m and 15013m.  120 capsule  3  . simvastatin (ZOCOR) 40 MG tablet Take 1 tablet (40 mg total) by mouth at bedtime.  30 tablet  11       Allergies: Reviewed with the patient again today. Allergies  Allergen Reactions  . Codeine Nausea And Vomiting  . Iodine Anaphylaxis    Contrast dye  . Penicillins      Physical Exam:  Blood pressure (!) 121/52, pulse 65, temperature 97.7 F (36.5 C), temperature source Oral, resp. rate 18, height 5' 3"  (1.6 m), weight 135 lb 4.8 oz (61.4 kg), SpO2 99 %. ECOG: 1 General appearance: Alert, awake woman without distress. Head:  Sclerae anicteric. No oral ulcers or thrush. Neck: no thyroid masses or lymphadenopathy. Lymph nodes: Cervical, supraclavicular, and axillary nodes normal.  Heart:regular rate and rhythm, S1, S2 normal. No murmurs rubs or gallops. Lung:chest clear, no wheezing, rales. No dullness to  percussion. Abdomin: Soft, nontender without rebound or guarding. No splenomegaly noted. EXT:no erythema, induration, or nodules. No edema Neurological examination: No deficits noted. Skin examination: No rashes or lesions noted.  Lab Results: Lab Results  Component Value Date   WBC 8.9 08/05/2016   HGB 16.5 (H) 08/05/2016   HCT 49.5 (H) 08/05/2016   MCV 106.5 (H) 08/05/2016   PLT 909 (H) 08/05/2016      Chemistry      Component Value Date/Time   NA 140 06/03/2016 1058   K 4.4 06/03/2016 1058   CL 103 09/11/2015 0856   CL 105 01/30/2013 0825   CO2 25 06/03/2016 1058   BUN 13.1 06/03/2016 1058   CREATININE 0.9 06/03/2016 1058      Component Value Date/Time   CALCIUM 9.7 06/03/2016 1058   ALKPHOS 80 06/03/2016 1058   AST 32 06/03/2016 1058   ALT 26 06/03/2016 1058   BILITOT 0.50 06/03/2016 1058      Impression and Plan:   70 year old female with the following issues.  1. Myeloproliferative disorder presenting with essential thrombocythemia and JAK-2 positive mutation.  She is currently on hydroxyurea alternating between 1000 and 1500 mg.  Platelet count today continues to increase slightly and she is mildly symptomatic. I recommended increasing her hydroxyurea to 1500 mg daily moving forward. Restaging with imaging studies and a bone marrow biopsy will be recommended in the future if she develops any constitutional symptoms. We will recheck her platelet count after dosage adjustment in 2 months.  2. Thrombosis prophylaxis. Continue to be on aspirin. Her thrombosis risk is low given the lack of previous thrombosis episodes. 3. Bone pain: Likely related to her myeloproliferative disorder. We have discussed different strategies to alleviate that including anti-inflammatory medication and stretching exercise. Anti-JAK 2 agents also a consideration will continue to address that. 4. Age-appropriate cancer screening: She is up-to-date at this time. 5. Follow-up: Will be in 2 months.        [facility], MD 11/9/20178:44 AM

## 2016-08-05 NOTE — Telephone Encounter (Signed)
Appointments scheduled for 10/06/15 per 08/05/16 los. Patients refused copy of AVS report and appointment schedule. Patient is aware of all scheduled appointments. 08/05/16

## 2016-09-13 ENCOUNTER — Other Ambulatory Visit: Payer: Medicare Other | Admitting: Internal Medicine

## 2016-09-13 DIAGNOSIS — Z13220 Encounter for screening for lipoid disorders: Secondary | ICD-10-CM

## 2016-09-13 DIAGNOSIS — Z130 Encounter for screening for diseases of the blood and blood-forming organs and certain disorders involving the immune mechanism: Secondary | ICD-10-CM

## 2016-09-13 DIAGNOSIS — Z1329 Encounter for screening for other suspected endocrine disorder: Secondary | ICD-10-CM | POA: Diagnosis not present

## 2016-09-13 DIAGNOSIS — E559 Vitamin D deficiency, unspecified: Secondary | ICD-10-CM

## 2016-09-13 DIAGNOSIS — Z0000 Encounter for general adult medical examination without abnormal findings: Secondary | ICD-10-CM

## 2016-09-13 DIAGNOSIS — E785 Hyperlipidemia, unspecified: Secondary | ICD-10-CM

## 2016-09-13 LAB — CBC WITH DIFFERENTIAL/PLATELET
BASOS ABS: 0 {cells}/uL (ref 0–200)
Basophils Relative: 0 %
EOS ABS: 192 {cells}/uL (ref 15–500)
Eosinophils Relative: 2 %
HEMATOCRIT: 46.7 % — AB (ref 35.0–45.0)
Hemoglobin: 15.5 g/dL (ref 11.7–15.5)
LYMPHS PCT: 21 %
Lymphs Abs: 2016 cells/uL (ref 850–3900)
MCH: 35.7 pg — AB (ref 27.0–33.0)
MCHC: 33.2 g/dL (ref 32.0–36.0)
MCV: 107.6 fL — AB (ref 80.0–100.0)
MONO ABS: 864 {cells}/uL (ref 200–950)
MPV: 8.9 fL (ref 7.5–12.5)
Monocytes Relative: 9 %
NEUTROS PCT: 68 %
Neutro Abs: 6528 cells/uL (ref 1500–7800)
Platelets: 654 10*3/uL — ABNORMAL HIGH (ref 140–400)
RBC: 4.34 MIL/uL (ref 3.80–5.10)
RDW: 15.2 % — AB (ref 11.0–15.0)
WBC: 9.6 10*3/uL (ref 3.8–10.8)

## 2016-09-13 LAB — LIPID PANEL
CHOL/HDL RATIO: 3.3 ratio (ref <5.0)
CHOLESTEROL: 128 mg/dL (ref <200)
HDL: 39 mg/dL — ABNORMAL LOW (ref >50)
LDL Cholesterol: 61 mg/dL (ref <100)
TRIGLYCERIDES: 140 mg/dL (ref <150)
VLDL: 28 mg/dL (ref <30)

## 2016-09-13 LAB — COMPLETE METABOLIC PANEL WITH GFR
ALK PHOS: 82 U/L (ref 33–130)
ALT: 15 U/L (ref 6–29)
AST: 20 U/L (ref 10–35)
Albumin: 3.8 g/dL (ref 3.6–5.1)
BUN: 17 mg/dL (ref 7–25)
CALCIUM: 9 mg/dL (ref 8.6–10.4)
CHLORIDE: 105 mmol/L (ref 98–110)
CO2: 26 mmol/L (ref 20–31)
Creat: 0.87 mg/dL (ref 0.60–0.93)
GFR, EST AFRICAN AMERICAN: 77 mL/min (ref >=60)
GFR, EST NON AFRICAN AMERICAN: 67 mL/min (ref >=60)
Glucose, Bld: 71 mg/dL (ref 65–99)
POTASSIUM: 4.8 mmol/L (ref 3.5–5.3)
Sodium: 140 mmol/L (ref 135–146)
Total Bilirubin: 0.4 mg/dL (ref 0.2–1.2)
Total Protein: 6.2 g/dL (ref 6.1–8.1)

## 2016-09-13 LAB — TSH: TSH: 1.37 mIU/L

## 2016-09-14 LAB — VITAMIN D 25 HYDROXY (VIT D DEFICIENCY, FRACTURES): Vit D, 25-Hydroxy: 39 ng/mL (ref 30–100)

## 2016-09-17 ENCOUNTER — Ambulatory Visit (INDEPENDENT_AMBULATORY_CARE_PROVIDER_SITE_OTHER): Payer: Medicare Other | Admitting: Internal Medicine

## 2016-09-17 ENCOUNTER — Encounter: Payer: Self-pay | Admitting: Internal Medicine

## 2016-09-17 VITALS — HR 72 | Resp 14 | Wt 134.0 lb

## 2016-09-17 DIAGNOSIS — Z1389 Encounter for screening for other disorder: Secondary | ICD-10-CM

## 2016-09-17 DIAGNOSIS — E7849 Other hyperlipidemia: Secondary | ICD-10-CM

## 2016-09-17 DIAGNOSIS — D473 Essential (hemorrhagic) thrombocythemia: Secondary | ICD-10-CM | POA: Diagnosis not present

## 2016-09-17 DIAGNOSIS — Z1589 Genetic susceptibility to other disease: Secondary | ICD-10-CM | POA: Diagnosis not present

## 2016-09-17 DIAGNOSIS — S61219A Laceration without foreign body of unspecified finger without damage to nail, initial encounter: Secondary | ICD-10-CM | POA: Diagnosis not present

## 2016-09-17 DIAGNOSIS — Z0000 Encounter for general adult medical examination without abnormal findings: Secondary | ICD-10-CM

## 2016-09-17 DIAGNOSIS — E784 Other hyperlipidemia: Secondary | ICD-10-CM

## 2016-09-17 LAB — POCT URINALYSIS DIPSTICK
BILIRUBIN UA: NEGATIVE
GLUCOSE UA: NEGATIVE
KETONES UA: NEGATIVE
LEUKOCYTES UA: NEGATIVE
Nitrite, UA: NEGATIVE
PH UA: 5
Protein, UA: NEGATIVE
RBC UA: NEGATIVE
Spec Grav, UA: <=1.005
Urobilinogen, UA: NEGATIVE

## 2016-09-17 NOTE — Progress Notes (Signed)
Subjective:    Patient ID: Anna Fuller, female    DOB: 04-23-1946, 70 y.o.   MRN: [id number]  HPI  70 year old Female for health maintenance exam and evaluation of medical issues. Had fall on escalator in [place] 2 weeks ago. Lacerated left 2nd and 3rd fingers but are healing.Did not seek medical attention there. Simply bandaged up her fingers. Has had reaction to tetanus toxoid in the past so will not give update.  History of essential thrombocytosis with JAK2 positive myeloproliferative disorder diagnosed in 2007. Bone marrow in 2008 showed early signs of myeloproliferative disorder. Anagrelide was tried initially but patient did not tolerate it. Subsequently was changed to Hydrea. Platelet counts have been controlled around counts of 400-600,000 with Hydrea. Patient is treated by Dr. Alen Blew.  She is allergic to contrast dye cortisone and penicillin.  Past medical history: History of hyperlipidemia treated with simvastatin. She is on Premarin for estrogen replacement and does not want to be taken off.  Hospitalized with fever of unknown Arjun in 1972. Cholecystectomy 1983. Hysterectomy with bilateral oophorectomy 1981. Right wrist surgery 2008. Foot surgery of both feet 1965, 1968, 1969. Has had left and right knee surgeries.  Social history: She is single and retired. 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 She completed 2 years of college. Does not smoke or consume alcohol. Enjoys walking and doing yard work.  Family history: Her sister is Mareta Mervis who is also a patient here. No other siblings. Father died at age 50 with complications of diabetes. Mother died at age 38 of pulmonary fibrosis. Pneumovax immunization 2011. Zostavax vaccine 2011. Prevnar July 2016.  Patient had tetanus immunization in 2006 but had a significant local reaction so that will not be repeated.    Review of Systems see above       Objective:   Physical Exam  Constitutional: She is oriented to person, place, and time. She appears well-developed and well-nourished. No distress.  HENT:  Head: Normocephalic and atraumatic.  Right Ear: External ear normal.  Left Ear: External ear normal.  Nose: Nose normal.  Mouth/Throat: Oropharynx is clear and moist. No oropharyngeal exudate.  Eyes: Conjunctivae are normal. Pupils are equal, round, and reactive to light. Right eye exhibits no discharge. Left eye exhibits no discharge. No scleral icterus.  Neck: Neck supple. No JVD present. No thyromegaly present.  Cardiovascular: Normal rate, regular rhythm, normal heart sounds and intact distal pulses.   No murmur heard. Pulmonary/Chest: Effort normal and breath sounds normal. She has no wheezes. She has no rales.  Breasts normal female  Abdominal: Soft. Bowel sounds are normal. She exhibits no distension and no mass. There is no tenderness. There is no rebound and no guarding.  Genitourinary:  Genitourinary Comments: Status post hysterectomy with BSO 1981.  Musculoskeletal: Normal range of motion. She exhibits no edema.  Lymphadenopathy:    She has no cervical adenopathy.  Neurological: She is alert and oriented to person, place, and time. She has normal reflexes. No cranial nerve deficit. Coordination normal.  Skin: Skin is warm and dry. No rash noted. She is not diaphoretic.  Psychiatric: She has a normal mood and affect. Her behavior is normal. Judgment and thought content normal.  Vitals reviewed.  Does not show me lacerated fingers today. They are bandaged.       Assessment & Plan:  Essential thrombocytosis-platelet count 654,001 month ago was 909,000  JAK2 myeloproliferative disorder  Hyperlipidemia-treated with statin medication  Estrogen replacement  Lacerated fingers secondary to a fall in [place]  Plan: Continue same medications and return here in one year. Continue treatment for thrombocytosis with Dr.  Alen Blew.  Subjective:   Patient presents for Medicare Annual/Subsequent preventive examination.  Review Past Medical/Family/Social:See above   Risk Factors  Current exercise habits: Normal housework Dietary issues discussed: Low fat low carbohydrate discussed  Cardiac risk factors:Hyperlipidemia  Depression Screen  (Note: if answer to either of the following is "Yes", a more complete depression screening is indicated)   Over the past two weeks, have you felt down, depressed or hopeless? No  Over the past two weeks, have you felt little interest or pleasure in doing things? No Have you lost interest or pleasure in daily life? No Do you often feel hopeless? No Do you cry easily over simple problems? No   Activities of Daily Living  In your present state of health, do you have any difficulty performing the following activities?:   Driving? No  Managing money? No  Feeding yourself? No  Getting from bed to chair? No  Climbing a flight of stairs? No  Preparing food and eating?: No  Bathing or showering? No  Getting dressed: No  Getting to the toilet? No  Using the toilet:No  Moving around from place to place: No  In the past year have you fallen or had a near fall?:Yes in [place] Are you sexually active? No  Do you have more than one partner? No   Hearing Difficulties: No  Do you often ask people to speak up or repeat themselves? No  Do you experience ringing or noises in your ears? Yes  Do you have difficulty understanding soft or whispered voices? No  Do you feel that you have a problem with memory? No Do you often misplace items? No    Home Safety:  Do you have a smoke alarm at your residence? Yes Do you have grab bars in the bathroom?No Do you have throw rugs in your house? No   Cognitive Testing  Alert? Yes Normal Appearance?Yes  Oriented to person? Yes Place? Yes  Time? Yes  Recall of three objects? Yes  Can perform simple calculations? Yes  Displays  appropriate judgment?Yes  Can read the correct time from a watch face?Yes   List the Names of Other Physician/Practitioners you currently use:  See referral list for the physicians patient is currently seeing.     Review of Systems: See above   Objective:     General appearance: Appears stated age and thin Head: Normocephalic, without obvious abnormality, atraumatic  Eyes: conj clear, EOMi PEERLA  Ears: normal TM's and external ear canals both ears  Nose: Nares normal. Septum midline. Mucosa normal. No drainage or sinus tenderness.  Throat: lips, mucosa, and tongue normal; teeth and gums normal  Neck: no adenopathy, no carotid bruit, no JVD, supple, symmetrical, trachea midline and thyroid not enlarged, symmetric, no tenderness/mass/nodules  No CVA tenderness.  Lungs: clear to auscultation bilaterally  Breasts: normal appearance, no masses or tenderness Heart: regular rate and rhythm, S1, S2 normal, no murmur, click, rub or gallop  Abdomen: soft, non-tender; bowel sounds normal; no masses, no organomegaly  Musculoskeletal: ROM normal in all joints, no crepitus, no deformity, Normal muscle strengthen. Back  is symmetric, no curvature. Skin: Skin color, texture, turgor normal. No rashes or lesions  Lymph nodes: Cervical, supraclavicular, and axillary nodes normal.  Neurologic: CN 2 -12 Normal, Normal symmetric reflexes. Normal coordination and gait  Psych:  Alert & Oriented x 3, Mood appear stable.    Assessment:    Annual wellness medicare exam   Plan:    During the course of the visit the patient was educated and counseled about appropriate screening and preventive services including:   Annual mammogram  Annual flu vaccine     Patient Instructions (the written plan) was given to the patient.  Medicare Attestation  I have personally reviewed:  The patient's medical and social history  Their use of alcohol, tobacco or illicit drugs  Their current medications and  supplements  The patient's functional ability including ADLs,fall risks, home safety risks, cognitive, and hearing and visual impairment  Diet and physical activities  Evidence for depression or mood disorders  The patient's weight, height, BMI, and visual acuity have been recorded in the chart. I have made referrals, counseling, and provided education to the patient based on review of the above and I have provided the patient with a written personalized care plan for preventive services.

## 2016-09-22 NOTE — Patient Instructions (Signed)
It was a pleasure to see you today. Continue statin medication. Return in one year. Continue treatment for thrombocytosis with Dr. Alen Blew. Call if finger lacerations are not healing well in the near future

## 2016-10-05 ENCOUNTER — Telehealth: Payer: Self-pay | Admitting: Oncology

## 2016-10-05 ENCOUNTER — Other Ambulatory Visit (HOSPITAL_BASED_OUTPATIENT_CLINIC_OR_DEPARTMENT_OTHER): Payer: Medicare Other

## 2016-10-05 ENCOUNTER — Ambulatory Visit (HOSPITAL_BASED_OUTPATIENT_CLINIC_OR_DEPARTMENT_OTHER): Payer: Medicare Other | Admitting: Oncology

## 2016-10-05 VITALS — BP 129/47 | HR 71 | Temp 97.9°F | Resp 18 | Wt 134.6 lb

## 2016-10-05 DIAGNOSIS — D473 Essential (hemorrhagic) thrombocythemia: Secondary | ICD-10-CM

## 2016-10-05 LAB — CBC WITH DIFFERENTIAL/PLATELET
BASO%: 1 % (ref 0.0–2.0)
Basophils Absolute: 0.1 10*3/uL (ref 0.0–0.1)
EOS%: 1.4 % (ref 0.0–7.0)
Eosinophils Absolute: 0.1 10*3/uL (ref 0.0–0.5)
HEMATOCRIT: 45.9 % (ref 34.8–46.6)
HEMOGLOBIN: 15.5 g/dL (ref 11.6–15.9)
LYMPH#: 2.1 10*3/uL (ref 0.9–3.3)
LYMPH%: 29.3 % (ref 14.0–49.7)
MCH: 35.7 pg — ABNORMAL HIGH (ref 25.1–34.0)
MCHC: 33.8 g/dL (ref 31.5–36.0)
MCV: 105.8 fL — ABNORMAL HIGH (ref 79.5–101.0)
MONO#: 0.6 10*3/uL (ref 0.1–0.9)
MONO%: 8.4 % (ref 0.0–14.0)
NEUT#: 4.4 10*3/uL (ref 1.5–6.5)
NEUT%: 59.9 % (ref 38.4–76.8)
Platelets: 484 10*3/uL — ABNORMAL HIGH (ref 145–400)
RBC: 4.34 10*6/uL (ref 3.70–5.45)
RDW: 15.2 % — AB (ref 11.2–14.5)
WBC: 7.3 10*3/uL (ref 3.9–10.3)

## 2016-10-05 LAB — COMPREHENSIVE METABOLIC PANEL
ALT: 28 U/L (ref 0–55)
AST: 30 U/L (ref 5–34)
Albumin: 4 g/dL (ref 3.5–5.0)
Alkaline Phosphatase: 84 U/L (ref 40–150)
Anion Gap: 10 mEq/L (ref 3–11)
BUN: 14.4 mg/dL (ref 7.0–26.0)
CALCIUM: 9.5 mg/dL (ref 8.4–10.4)
CHLORIDE: 103 meq/L (ref 98–109)
CO2: 26 mEq/L (ref 22–29)
CREATININE: 0.9 mg/dL (ref 0.6–1.1)
EGFR: 63 mL/min/{1.73_m2} — ABNORMAL LOW (ref >90)
GLUCOSE: 87 mg/dL (ref 70–140)
Potassium: 4.1 mEq/L (ref 3.5–5.1)
Sodium: 139 mEq/L (ref 136–145)
Total Bilirubin: 0.61 mg/dL (ref 0.20–1.20)
Total Protein: 7 g/dL (ref 6.4–8.3)

## 2016-10-05 LAB — LACTATE DEHYDROGENASE: LDH: 204 U/L (ref 125–245)

## 2016-10-05 NOTE — Progress Notes (Signed)
Hematology and Oncology Follow Up Visit  Anna Fuller [id number] [birth year] 71 y.o. 10/05/2016 10:14 AM      Principle Diagnosis: 71 year old female with essential thrombocythemia.  She has JAK2 positive myeloproliferative disorder diagnosed 2007.  Prior Therapy: 1. She is status post anagrelide therapy discontinued due to do intolerance. 2. Status post bone marrow biopsy in August 2008 which showed early signs of myeloproliferative disorder.  Current therapy:  She has been treated with hydroxyurea since 2009. She is currently taking alternating dosing of 1500 mg and 1000 mg every other day. Her dose increased to 1500 mg daily starting on 08/06/2016.  Interim History: Anna Fuller presents today for a followup visit. Since the last visit, she reports no major complaints. She increase hydroxyurea to 1500 mg in the last 2 months and tolerated it reasonably well. She did report some mild nausea and decreased appetite which has resolved at this time. She was able to travel to [place] for a period of time without any major complications. She did develop flu like symptoms and upper respiratory tract infection after her return. She is recovering well at this time although she does have nonproductive cough at times. She does not report any arthralgias or myalgias. Does not report any chest pain or difficulty breathing.  She denied any fevers or chills. She still performs activities of daily living without any decline. Her quality of life have not dramatically changed. Her appetite is reasonable and weight not dramatically changed.  She reports no blurry vision. No report any syncope or seizures. She is not reporting any fevers or chills. She has reported no lymphadenopathy. She had not reported any oral ulcers. Has not reported any chest pain or difficulty breathing. Has not reported any vomiting. She denied any frequency urgency or urination difficulties. She does report occasional arthralgias. Her  rest of her review of system is unremarkable.  Medications: I have reviewed the patient's current medications. Current Outpatient Prescriptions  Medication Sig Dispense Refill  . aspirin 81 MG tablet Take 81 mg by mouth daily.        . cholecalciferol (VITAMIN D) 1000 UNITS tablet Take 2,000 Units by mouth daily.      Marland Kitchen estrogens, conjugated, (PREMARIN) 0.3 MG tablet Take 1 tablet (0.3 mg total) by mouth daily. Take daily for 21 days then do not take for 7 days.  30 tablet  11  . hydroxyurea (HYDREA) 500 MG capsule Take one tab twice a day.  Every other night take an additional tablet at night.  (Alternate 1022m and 15069m.  120 capsule  3  . simvastatin (ZOCOR) 40 MG tablet Take 1 tablet (40 mg total) by mouth at bedtime.  30 tablet  11       Allergies: Reviewed with the patient again today. Allergies  Allergen Reactions  . Codeine Nausea And Vomiting  . Iodine Anaphylaxis    Contrast dye  . Penicillins Other (See Comments)     Physical Exam:  Blood pressure (!) 129/47, pulse 71, temperature 97.9 F (36.6 C), temperature source Oral, resp. rate 18, weight 134 lb 9.6 oz (61.1 kg), SpO2 99 %. ECOG: 1 General appearance: Well-appearing woman without distress. Head:  Sclerae anicteric. No oral ulcers or lesions. Neck: no thyroid masses or lymphadenopathy. Lymph nodes: Cervical, supraclavicular, and axillary nodes normal.  Heart:regular rate and rhythm, S1, S2 normal. No murmurs rubs or gallops. Lung:chest clear, no wheezing, rales. No dullness to percussion. Abdomin: Soft, nontender without rebound or guarding. No rebound or guarding.  EXT:no erythema, induration, or nodules. No edema Neurological examination: No deficits noted. Skin examination: No rashes or lesions noted.  Lab Results: Lab Results  Component Value Date   WBC 7.3 10/05/2016   HGB 15.5 10/05/2016   HCT 45.9 10/05/2016   MCV 105.8 (H) 10/05/2016   PLT 484 (H) 10/05/2016     Chemistry      Component  Value Date/Time   NA 140 09/13/2016 1046   NA 139 08/05/2016 0809   K 4.8 09/13/2016 1046   K 4.3 08/05/2016 0809   CL 105 09/13/2016 1046   CL 105 01/30/2013 0825   CO2 26 09/13/2016 1046   CO2 25 08/05/2016 0809   BUN 17 09/13/2016 1046   BUN 11.6 08/05/2016 0809   CREATININE 0.87 09/13/2016 1046   CREATININE 1.0 08/05/2016 0809      Component Value Date/Time   CALCIUM 9.0 09/13/2016 1046   CALCIUM 9.8 08/05/2016 0809   ALKPHOS 82 09/13/2016 1046   ALKPHOS 86 08/05/2016 0809   AST 20 09/13/2016 1046   AST 27 08/05/2016 0809   ALT 15 09/13/2016 1046   ALT 20 08/05/2016 0809   BILITOT 0.4 09/13/2016 1046   BILITOT 0.48 08/05/2016 0809      Impression and Plan:   71 year old female with the following issues.  1. Myeloproliferative disorder presenting with essential thrombocythemia and JAK-2 positive mutation.  She is currently on hydroxyurea 1500 mg daily that was started on 08/06/2017. She tolerated this dose well although she does report some periodic nausea and anorexia. Her platelet count showed excellent response to the current dose and no adjustment is needed at this time. Risks and benefits of continuing this dose were reviewed today and she is agreeable to continue. If she develops more side effects associated with this medication next time, we will consider dose reduction. If her platelets drop precipitously as well we will also consider dose reduction.  2. Thrombosis prophylaxis. Continue to be on aspirin. Her thrombosis risk is low given the lack of previous thrombosis episodes. 3. Bone pain: Likely related to her myeloproliferative disorder. Not dramatically change at this time. We will continue to monitor. 4. Age-appropriate cancer screening: She is up-to-date at this time. 5. Follow-up: Will be in 2 months.        [facility], MD 1/9/201810:14 AM

## 2016-10-05 NOTE — Telephone Encounter (Signed)
Appointments scheduled per 1/9 LOS. Patient given AVS report and calendars with future scheduled appointments. °

## 2016-10-20 ENCOUNTER — Other Ambulatory Visit: Payer: Self-pay | Admitting: Internal Medicine

## 2016-12-02 ENCOUNTER — Ambulatory Visit (HOSPITAL_BASED_OUTPATIENT_CLINIC_OR_DEPARTMENT_OTHER): Payer: Medicare Other | Admitting: Oncology

## 2016-12-02 ENCOUNTER — Telehealth: Payer: Self-pay | Admitting: Oncology

## 2016-12-02 ENCOUNTER — Other Ambulatory Visit (HOSPITAL_BASED_OUTPATIENT_CLINIC_OR_DEPARTMENT_OTHER): Payer: Medicare Other

## 2016-12-02 VITALS — BP 128/42 | HR 82 | Temp 97.5°F | Resp 18 | Ht 63.0 in | Wt 137.7 lb

## 2016-12-02 DIAGNOSIS — D473 Essential (hemorrhagic) thrombocythemia: Secondary | ICD-10-CM

## 2016-12-02 DIAGNOSIS — G893 Neoplasm related pain (acute) (chronic): Secondary | ICD-10-CM

## 2016-12-02 LAB — COMPREHENSIVE METABOLIC PANEL
ALT: 27 U/L (ref 0–55)
AST: 27 U/L (ref 5–34)
Albumin: 4.5 g/dL (ref 3.5–5.0)
Alkaline Phosphatase: 89 U/L (ref 40–150)
Anion Gap: 10 mEq/L (ref 3–11)
BILIRUBIN TOTAL: 0.66 mg/dL (ref 0.20–1.20)
BUN: 18.4 mg/dL (ref 7.0–26.0)
CHLORIDE: 105 meq/L (ref 98–109)
CO2: 24 mEq/L (ref 22–29)
CREATININE: 1 mg/dL (ref 0.6–1.1)
Calcium: 9.8 mg/dL (ref 8.4–10.4)
EGFR: 59 mL/min/{1.73_m2} — ABNORMAL LOW (ref >90)
Glucose: 91 mg/dl (ref 70–140)
Potassium: 4.5 mEq/L (ref 3.5–5.1)
Sodium: 140 mEq/L (ref 136–145)
TOTAL PROTEIN: 7.6 g/dL (ref 6.4–8.3)

## 2016-12-02 LAB — CBC WITH DIFFERENTIAL/PLATELET
BASO%: 0.7 % (ref 0.0–2.0)
Basophils Absolute: 0.1 10*3/uL (ref 0.0–0.1)
EOS%: 1.5 % (ref 0.0–7.0)
Eosinophils Absolute: 0.1 10*3/uL (ref 0.0–0.5)
HCT: 45.8 % (ref 34.8–46.6)
HEMOGLOBIN: 15.7 g/dL (ref 11.6–15.9)
LYMPH#: 1.8 10*3/uL (ref 0.9–3.3)
LYMPH%: 24.1 % (ref 14.0–49.7)
MCH: 37 pg — ABNORMAL HIGH (ref 25.1–34.0)
MCHC: 34.3 g/dL (ref 31.5–36.0)
MCV: 108 fL — ABNORMAL HIGH (ref 79.5–101.0)
MONO#: 0.6 10*3/uL (ref 0.1–0.9)
MONO%: 7.8 % (ref 0.0–14.0)
NEUT%: 65.9 % (ref 38.4–76.8)
NEUTROS ABS: 4.9 10*3/uL (ref 1.5–6.5)
Platelets: 542 10*3/uL — ABNORMAL HIGH (ref 145–400)
RBC: 4.24 10*6/uL (ref 3.70–5.45)
RDW: 16.1 % — AB (ref 11.2–14.5)
WBC: 7.4 10*3/uL (ref 3.9–10.3)

## 2016-12-02 NOTE — Telephone Encounter (Signed)
Appointments scheduled per 12/02/16 los. Patient was given a copy of the AVS report and appointment schedule per 12/02/16 los. °

## 2016-12-02 NOTE — Progress Notes (Signed)
Hematology and Oncology Follow Up Visit  SHAKEVIA SARRIS [id number] March 31, 1946 71 y.o. 12/02/2016 9:02 AM      Principle Diagnosis: 71 year old female with essential thrombocythemia.  She has JAK2 positive myeloproliferative disorder diagnosed 2007.  Prior Therapy: 1. She is status post anagrelide therapy discontinued due to do intolerance. 2. Status post bone marrow biopsy in August 2008 which showed early signs of myeloproliferative disorder.  Current therapy:  She has been treated with hydroxyurea since 2009. She is currently taking alternating dosing of 1500 mg and 1000 mg every other day. Her dose increased to 1500 mg daily starting on 08/06/2016.  Interim History: Ms. Grupp presents today for a followup visit. Since the last visit, she reports feeling well without any changes since the last visit. She did report few episodes of epistaxis that has spontaneously resolved. She did not require any intervention. She denied any other bleeding episodes including hematochezia, melena or any mucosal bleeding.   She continues to take hydroxyurea to 1500 mg tolerated it reasonably well. She did report some mild nausea and decreased appetite which has resolved at this time. She does report some occasional body aches and scalp numbness which have not changed. Despite these symptoms, she continues to be active and attends to activities of daily living.  She denied any fevers or chills. She still performs activities of daily living without any decline. Her quality of life have not dramatically changed. Her appetite is unchanged.  She reports no blurry vision. No report any syncope or seizures. She is not reporting any fevers or chills. She has reported no lymphadenopathy. She had not reported any oral ulcers. Has not reported any chest pain or difficulty breathing. Has not reported any vomiting. She denied any frequency urgency or urination difficulties. She does report occasional arthralgias. Her  rest of her review of system is unremarkable.  Medications: I have reviewed the patient's current medications. Current Outpatient Prescriptions  Medication Sig Dispense Refill  . aspirin 81 MG tablet Take 81 mg by mouth daily.        . cholecalciferol (VITAMIN D) 1000 UNITS tablet Take 2,000 Units by mouth daily.      Marland Kitchen estrogens, conjugated, (PREMARIN) 0.3 MG tablet Take 1 tablet (0.3 mg total) by mouth daily. Take daily for 21 days then do not take for 7 days.  30 tablet  11  . hydroxyurea (HYDREA) 500 MG capsule Take one tab twice a day.  Every other night take an additional tablet at night.  (Alternate 1032m and 15069m.  120 capsule  3  . simvastatin (ZOCOR) 40 MG tablet Take 1 tablet (40 mg total) by mouth at bedtime.  30 tablet  11       Allergies: Reviewed with the patient again today. Allergies  Allergen Reactions  . Codeine Nausea And Vomiting  . Iodine Anaphylaxis    Contrast dye  . Penicillins Other (See Comments)     Physical Exam:  Blood pressure (!) 128/42, pulse 82, temperature 97.5 F (36.4 C), temperature source Oral, resp. rate 18, height _0  (1.6 m), weight 137 lb 11.2 oz (62.5 kg), SpO2 99 %. ECOG: 1 General appearance: Alert, awake woman without distress. Head:  Sclerae anicteric. No oral mucosal bleeding. Neck: no thyroid masses or lymphadenopathy. Lymph nodes: Cervical, supraclavicular, and axillary nodes normal.  Heart:regular rate and rhythm, S1, S2 normal. No murmurs rubs or gallops. Lung:chest clear, no wheezing, rales. No dullness to percussion. Abdomin: Soft, nontender without rebound or guarding. No shifting dullness  or ascites. EXT:no erythema, induration, or nodules. No edema Neurological examination: No deficits noted. Skin examination: No rashes or lesions noted.  Lab Results: Lab Results  Component Value Date   WBC 7.4 12/02/2016   HGB 15.7 12/02/2016   HCT 45.8 12/02/2016   MCV 108.0 (H) 12/02/2016   PLT 542 (H) 12/02/2016      Chemistry      Component Value Date/Time   NA 139 10/05/2016 0930   K 4.1 10/05/2016 0930   CL 105 09/13/2016 1046   CL 105 01/30/2013 0825   CO2 26 10/05/2016 0930   BUN 14.4 10/05/2016 0930   CREATININE 0.9 10/05/2016 0930      Component Value Date/Time   CALCIUM 9.5 10/05/2016 0930   ALKPHOS 84 10/05/2016 0930   AST 30 10/05/2016 0930   ALT 28 10/05/2016 0930   BILITOT 0.61 10/05/2016 0930      Impression and Plan:   71 year old female with the following issues.  1. Myeloproliferative disorder presenting with essential thrombocythemia and JAK-2 positive mutation.  She is currently on hydroxyurea 1500 mg daily With this current dose starting 08/06/2017. She tolerated this dose well. Her platelet count showed excellent response in the last 4 months but today's platelets are slightly increased. Today's increase, does not require any dose modification and we'll continue to monitor for the time being. 2. Thrombosis prophylaxis. Continue to be on aspirin. Despite the mild epistaxis I have recommended continuing aspirin for the time being. 3. Bone pain: Likely related to her myeloproliferative disorder. Continues to be manageable. 4. Age-appropriate cancer screening: She is up-to-date at this time. 5. Follow-up: Will be in 2 months.        [facility], MD 3/8/20189:02 AM

## 2017-01-27 DIAGNOSIS — H353131 Nonexudative age-related macular degeneration, bilateral, early dry stage: Secondary | ICD-10-CM | POA: Diagnosis not present

## 2017-01-27 DIAGNOSIS — H524 Presbyopia: Secondary | ICD-10-CM | POA: Diagnosis not present

## 2017-01-28 ENCOUNTER — Telehealth: Payer: Self-pay | Admitting: Oncology

## 2017-01-28 ENCOUNTER — Other Ambulatory Visit (HOSPITAL_BASED_OUTPATIENT_CLINIC_OR_DEPARTMENT_OTHER): Payer: Medicare Other

## 2017-01-28 ENCOUNTER — Ambulatory Visit (HOSPITAL_BASED_OUTPATIENT_CLINIC_OR_DEPARTMENT_OTHER): Payer: Medicare Other | Admitting: Oncology

## 2017-01-28 VITALS — BP 117/54 | HR 63 | Temp 98.8°F | Resp 18 | Ht 63.0 in | Wt 138.4 lb

## 2017-01-28 DIAGNOSIS — D473 Essential (hemorrhagic) thrombocythemia: Secondary | ICD-10-CM

## 2017-01-28 DIAGNOSIS — G893 Neoplasm related pain (acute) (chronic): Secondary | ICD-10-CM

## 2017-01-28 DIAGNOSIS — Z7982 Long term (current) use of aspirin: Secondary | ICD-10-CM

## 2017-01-28 LAB — CBC WITH DIFFERENTIAL/PLATELET
BASO%: 1.3 % (ref 0.0–2.0)
Basophils Absolute: 0.1 10*3/uL (ref 0.0–0.1)
EOS%: 1.5 % (ref 0.0–7.0)
Eosinophils Absolute: 0.1 10*3/uL (ref 0.0–0.5)
HEMATOCRIT: 41.7 % (ref 34.8–46.6)
HGB: 14.3 g/dL (ref 11.6–15.9)
LYMPH%: 31.3 % (ref 14.0–49.7)
MCH: 38.9 pg — ABNORMAL HIGH (ref 25.1–34.0)
MCHC: 34.4 g/dL (ref 31.5–36.0)
MCV: 113.1 fL — AB (ref 79.5–101.0)
MONO#: 0.5 10*3/uL (ref 0.1–0.9)
MONO%: 8.4 % (ref 0.0–14.0)
NEUT%: 57.5 % (ref 38.4–76.8)
NEUTROS ABS: 3.2 10*3/uL (ref 1.5–6.5)
PLATELETS: 555 10*3/uL — AB (ref 145–400)
RBC: 3.68 10*6/uL — ABNORMAL LOW (ref 3.70–5.45)
RDW: 14.6 % — ABNORMAL HIGH (ref 11.2–14.5)
WBC: 5.6 10*3/uL (ref 3.9–10.3)
lymph#: 1.7 10*3/uL (ref 0.9–3.3)

## 2017-01-28 LAB — COMPREHENSIVE METABOLIC PANEL
ALBUMIN: 4.1 g/dL (ref 3.5–5.0)
ALK PHOS: 68 U/L (ref 40–150)
ALT: 20 U/L (ref 0–55)
ANION GAP: 10 meq/L (ref 3–11)
AST: 23 U/L (ref 5–34)
BILIRUBIN TOTAL: 0.58 mg/dL (ref 0.20–1.20)
BUN: 13 mg/dL (ref 7.0–26.0)
CALCIUM: 9.4 mg/dL (ref 8.4–10.4)
CO2: 26 mEq/L (ref 22–29)
CREATININE: 1 mg/dL (ref 0.6–1.1)
Chloride: 108 mEq/L (ref 98–109)
EGFR: 58 mL/min/{1.73_m2} — AB (ref >90)
Glucose: 98 mg/dl (ref 70–140)
Potassium: 4.2 mEq/L (ref 3.5–5.1)
Sodium: 144 mEq/L (ref 136–145)
TOTAL PROTEIN: 6.7 g/dL (ref 6.4–8.3)

## 2017-01-28 NOTE — Telephone Encounter (Signed)
Appointments scheduled per 5.4.18 LOS. Patient opted to not have print out of appointments or AVS report.

## 2017-01-28 NOTE — Progress Notes (Signed)
Hematology and Oncology Follow Up Visit  Anna Fuller [id number] [birth year] 71 y.o. 01/28/2017 8:18 AM      Principle Diagnosis: 71 year old female with essential thrombocythemia.  She has JAK2 positive myeloproliferative disorder diagnosed 2007.  Prior Therapy: 1. She is status post anagrelide therapy discontinued due to do intolerance. 2. Status post bone marrow biopsy in August 2008 which showed early signs of myeloproliferative disorder.  Current therapy:  She has been treated with hydroxyurea since 2009. She is currently taking alternating dosing of 1500 mg and 1000 mg every other day. Her dose increased to 1500 mg daily starting on 08/06/2016.  Interim History: Anna Fuller presents today for a followup visit. Since the last visit, she reports no new complaints. She continues to report numbness in her scalp as well as her right hand which has not changed at this time She denied any bleeding episodes including hematochezia, melena or any mucosal bleeding.  She remains on hydroxyurea to 1500 mg tolerated it reasonably well.She continues to be active and attends to activities of daily living. She denied any fevers or chills.Her quality of life have not dramatically changed. Her appetite remains the same.  She reports no blurry vision. No report any syncope or seizures. She is not reporting any fevers or chills. She has reported no lymphadenopathy. She had not reported any oral ulcers. Has not reported any chest pain or difficulty breathing. Has not reported any vomiting. She denied any frequency urgency or urination difficulties. She does report occasional arthralgias. Her rest of her review of system is unremarkable.  Medications: I have reviewed the patient's current medications. Current Outpatient Prescriptions  Medication Sig Dispense Refill  . aspirin 81 MG tablet Take 81 mg by mouth daily.        . cholecalciferol (VITAMIN D) 1000 UNITS tablet Take 2,000 Units by mouth daily.       Marland Kitchen estrogens, conjugated, (PREMARIN) 0.3 MG tablet Take 1 tablet (0.3 mg total) by mouth daily. Take daily for 21 days then do not take for 7 days.  30 tablet  11  . hydroxyurea (HYDREA) 500 MG capsule Take one tab twice a day.  Every other night take an additional tablet at night.  (Alternate 108m and 1504m.  120 capsule  3  . simvastatin (ZOCOR) 40 MG tablet Take 1 tablet (40 mg total) by mouth at bedtime.  30 tablet  11       Allergies: Reviewed with the patient again today. Allergies  Allergen Reactions  . Codeine Nausea And Vomiting  . Iodine Anaphylaxis    Contrast dye  . Penicillins Other (See Comments)     Physical Exam:  Blood pressure (!) 117/54, pulse 63, temperature 98.8 F (37.1 C), temperature source Oral, resp. rate 18, height 5' 3"  (1.6 m), weight 138 lb 6.4 oz (62.8 kg), SpO2 100 %. ECOG: 1 General appearance: Well-appearing woman without distress. Head:  Sclerae anicteric. No oral mucosal bleeding. Neck: no thyroid masses or lymphadenopathy. Lymph nodes: Cervical, supraclavicular, and axillary nodes normal.  Heart:regular rate and rhythm, S1, S2 normal. No murmurs rubs or gallops. Lung:chest clear, no wheezing, rales. No dullness to percussion. Abdomin: Soft, nontender without rebound or guarding. No rebound or guarding. EXT:no erythema, induration, or nodules. No edema Neurological examination: No deficits noted. Skin examination: No rashes or lesions noted.  Lab Results: Lab Results  Component Value Date   WBC 5.6 01/28/2017   HGB 14.3 01/28/2017   HCT 41.7 01/28/2017   MCV 113.1 (H) 01/28/2017  PLT 555 (H) 01/28/2017     Chemistry      Component Value Date/Time   NA 140 12/02/2016 0831   K 4.5 12/02/2016 0831   CL 105 09/13/2016 1046   CL 105 01/30/2013 0825   CO2 24 12/02/2016 0831   BUN 18.4 12/02/2016 0831   CREATININE 1.0 12/02/2016 0831      Component Value Date/Time   CALCIUM 9.8 12/02/2016 0831   ALKPHOS 89 12/02/2016 0831    AST 27 12/02/2016 0831   ALT 27 12/02/2016 0831   BILITOT 0.66 12/02/2016 0831      Impression and Plan:   71 year old female with the following issues.  1. Myeloproliferative disorder presenting with essential thrombocythemia and JAK-2 positive mutation.  She is currently on hydroxyurea 1500 mg daily  starting 08/06/2017. Her platelet count showed excellent response at the current dose. Platelet count today remains below 600 without any bleeding complaints. 2. Thrombosis prophylaxis. Continue to be on aspirin. Despite the mild epistaxis I have recommended continuing aspirin for the time being. 3. Bone pain: Likely related to her myeloproliferative disorder. Did not change from previous examinations. 4. Age-appropriate cancer screening: She is up-to-date at this time. 5. Follow-up: Will be in 2 months.        [facility], MD 5/4/20188:18 AM

## 2017-02-15 ENCOUNTER — Telehealth: Payer: Self-pay | Admitting: Allergy and Immunology

## 2017-02-15 NOTE — Telephone Encounter (Signed)
Erroneous encounter

## 2017-02-15 NOTE — Telephone Encounter (Deleted)
Gearldene called in and stated that she is on [facility] and [place] and has noticed since taking those, she has become very hoarse.  She is not sure which one is causing the issue.  Keiera would like to know what to do, or she asked if she needs to get something else?  Please advise.

## 2017-03-29 ENCOUNTER — Other Ambulatory Visit (HOSPITAL_BASED_OUTPATIENT_CLINIC_OR_DEPARTMENT_OTHER): Payer: Medicare Other

## 2017-03-29 ENCOUNTER — Telehealth: Payer: Self-pay | Admitting: Oncology

## 2017-03-29 ENCOUNTER — Ambulatory Visit (HOSPITAL_BASED_OUTPATIENT_CLINIC_OR_DEPARTMENT_OTHER): Payer: Medicare Other | Admitting: Oncology

## 2017-03-29 VITALS — BP 136/52 | HR 72 | Temp 98.8°F | Resp 18 | Ht 63.0 in | Wt 137.3 lb

## 2017-03-29 DIAGNOSIS — Z7982 Long term (current) use of aspirin: Secondary | ICD-10-CM

## 2017-03-29 DIAGNOSIS — D473 Essential (hemorrhagic) thrombocythemia: Secondary | ICD-10-CM

## 2017-03-29 LAB — CBC WITH DIFFERENTIAL/PLATELET
BASO%: 0.9 % (ref 0.0–2.0)
BASOS ABS: 0.1 10*3/uL (ref 0.0–0.1)
EOS ABS: 0.1 10*3/uL (ref 0.0–0.5)
EOS%: 1.4 % (ref 0.0–7.0)
HCT: 44.9 % (ref 34.8–46.6)
HEMOGLOBIN: 15.3 g/dL (ref 11.6–15.9)
LYMPH%: 28.4 % (ref 14.0–49.7)
MCH: 38.6 pg — AB (ref 25.1–34.0)
MCHC: 34 g/dL (ref 31.5–36.0)
MCV: 113.6 fL — AB (ref 79.5–101.0)
MONO#: 0.5 10*3/uL (ref 0.1–0.9)
MONO%: 8.8 % (ref 0.0–14.0)
NEUT#: 3.5 10*3/uL (ref 1.5–6.5)
NEUT%: 60.5 % (ref 38.4–76.8)
Platelets: 582 10*3/uL — ABNORMAL HIGH (ref 145–400)
RBC: 3.95 10*6/uL (ref 3.70–5.45)
RDW: 13.9 % (ref 11.2–14.5)
WBC: 5.8 10*3/uL (ref 3.9–10.3)
lymph#: 1.6 10*3/uL (ref 0.9–3.3)

## 2017-03-29 LAB — COMPREHENSIVE METABOLIC PANEL
ALBUMIN: 4 g/dL (ref 3.5–5.0)
ALK PHOS: 73 U/L (ref 40–150)
ALT: 24 U/L (ref 0–55)
ANION GAP: 9 meq/L (ref 3–11)
AST: 24 U/L (ref 5–34)
BUN: 17.1 mg/dL (ref 7.0–26.0)
CALCIUM: 9.5 mg/dL (ref 8.4–10.4)
CO2: 27 mEq/L (ref 22–29)
Chloride: 106 mEq/L (ref 98–109)
Creatinine: 0.9 mg/dL (ref 0.6–1.1)
EGFR: 62 mL/min/{1.73_m2} — AB (ref >90)
GLUCOSE: 90 mg/dL (ref 70–140)
POTASSIUM: 4.3 meq/L (ref 3.5–5.1)
SODIUM: 141 meq/L (ref 136–145)
Total Bilirubin: 0.43 mg/dL (ref 0.20–1.20)
Total Protein: 6.6 g/dL (ref 6.4–8.3)

## 2017-03-29 NOTE — Progress Notes (Signed)
Hematology and Oncology Follow Up Visit  Anna Fuller [id number] April 08, 1946 71 y.o. 03/29/2017 8:18 AM      Principle Diagnosis: 71 year old female with essential thrombocythemia.  She has JAK2 positive myeloproliferative disorder diagnosed 2007.  Prior Therapy: 1. She is status post anagrelide therapy discontinued due to do intolerance. 2. Status post bone marrow biopsy in August 2008 which showed early signs of myeloproliferative disorder.  Current therapy:  She has been treated with hydroxyurea since 2009. She is currently taking alternating dosing of 1500 mg and 1000 mg every other day. Her dose increased to 1500 mg daily starting on 08/06/2016.  Interim History: Anna Fuller presents today for a followup visit. Since the last visit, she continues to do reasonably well without any major changes in her health. She remains on hydroxyurea to 1500 mg tolerated it reasonably well. She does report scalp numbness which has not changed dramatically. She does have mild fatigue which does not interfere with her activities of daily living. She continues to be active and attends to activities of daily living. She denied any fevers or chills. Her quality of life have not dramatically changed. Her appetite remains the same. She denied any excessive bleeding or bruising.  She reports no blurry vision. No report any syncope or seizures. She is not reporting any fevers or chills. She has reported no lymphadenopathy. She had not reported any oral ulcers. Has not reported any chest pain or difficulty breathing. Has not reported any vomiting. She denied any frequency urgency or urination difficulties. She does report occasional arthralgias. Her rest of her review of system is unremarkable.  Medications: I have reviewed the patient's current medications. Current Outpatient Prescriptions  Medication Sig Dispense Refill  . aspirin 81 MG tablet Take 81 mg by mouth daily.        . cholecalciferol (VITAMIN D)  1000 UNITS tablet Take 2,000 Units by mouth daily.      Marland Kitchen estrogens, conjugated, (PREMARIN) 0.3 MG tablet Take 1 tablet (0.3 mg total) by mouth daily. Take daily for 21 days then do not take for 7 days.  30 tablet  11  . hydroxyurea (HYDREA) 500 MG capsule Take one tab twice a day.  Every other night take an additional tablet at night.  (Alternate 1039m and 15079m.  120 capsule  3  . simvastatin (ZOCOR) 40 MG tablet Take 1 tablet (40 mg total) by mouth at bedtime.  30 tablet  11       Allergies: Reviewed with the patient again today. Allergies  Allergen Reactions  . Codeine Nausea And Vomiting  . Iodine Anaphylaxis    Contrast dye  . Penicillins Other (See Comments)     Physical Exam:  Blood pressure (!) 136/52, pulse 72, temperature 98.8 F (37.1 C), temperature source Oral, resp. rate 18, height 5' 3"  (1.6 m), weight 137 lb 4.8 oz (62.3 kg), SpO2 98 %. ECOG: 1 General appearance: A well-appearing woman appearing without distress. Head:  Sclerae anicteric. No oral ulcers or lesions. Neck: no thyroid masses or lymphadenopathy. Lymph nodes: Cervical, supraclavicular, and axillary nodes normal.  Heart:regular rate and rhythm, S1, S2 normal. No murmurs rubs or gallops. Lung:chest clear, no wheezing, rales. No dullness to percussion. Abdomin: Soft, nontender without rebound or guarding. No shifting dullness or ascites. EXT:no erythema, induration, or nodules. No edema Neurological examination: No deficits noted. Skin examination: No rashes or lesions noted.  Lab Results: Lab Results  Component Value Date   WBC 5.8 03/29/2017   HGB 15.3  03/29/2017   HCT 44.9 03/29/2017   MCV 113.6 (H) 03/29/2017   PLT 582 (H) 03/29/2017     Chemistry      Component Value Date/Time   NA 144 01/28/2017 0741   K 4.2 01/28/2017 0741   CL 105 09/13/2016 1046   CL 105 01/30/2013 0825   CO2 26 01/28/2017 0741   BUN 13.0 01/28/2017 0741   CREATININE 1.0 01/28/2017 0741      Component Value  Date/Time   CALCIUM 9.4 01/28/2017 0741   ALKPHOS 68 01/28/2017 0741   AST 23 01/28/2017 0741   ALT 20 01/28/2017 0741   BILITOT 0.58 01/28/2017 0741      Impression and Plan:   71 year old female with the following issues.  1. Myeloproliferative disorder presenting with essential thrombocythemia and JAK-2 positive mutation.  She is currently on hydroxyurea 1500 mg daily  starting 08/06/2017. Her laboratory data were reviewed today at her platelet count remains under control. It did show slight rise in her count but no adjustment in her medication is needed. 2. Thrombosis prophylaxis. Continue to be on aspirin. Thrombosisepisodes. 3. Bone pain: Likely related to her myeloproliferative disorder. Did not change from previous examinations. 4. Age-appropriate cancer screening: She is up-to-date at this time. 5. Follow-up: Will be in 2 months.        [facility], MD 7/3/20188:18 AM

## 2017-03-29 NOTE — Telephone Encounter (Signed)
Scheduled appt per 7/3 los - patient is aware of appt time and date - did not want calender or avs

## 2017-04-25 ENCOUNTER — Other Ambulatory Visit: Payer: Self-pay

## 2017-05-31 ENCOUNTER — Ambulatory Visit (HOSPITAL_BASED_OUTPATIENT_CLINIC_OR_DEPARTMENT_OTHER): Payer: Medicare Other | Admitting: Oncology

## 2017-05-31 ENCOUNTER — Telehealth: Payer: Self-pay | Admitting: Oncology

## 2017-05-31 ENCOUNTER — Other Ambulatory Visit (HOSPITAL_BASED_OUTPATIENT_CLINIC_OR_DEPARTMENT_OTHER): Payer: Medicare Other

## 2017-05-31 VITALS — BP 124/56 | HR 65 | Temp 98.2°F | Resp 18 | Wt 138.1 lb

## 2017-05-31 DIAGNOSIS — D473 Essential (hemorrhagic) thrombocythemia: Secondary | ICD-10-CM

## 2017-05-31 LAB — COMPREHENSIVE METABOLIC PANEL
ALT: 21 U/L (ref 0–55)
ANION GAP: 9 meq/L (ref 3–11)
AST: 24 U/L (ref 5–34)
Albumin: 4.1 g/dL (ref 3.5–5.0)
Alkaline Phosphatase: 79 U/L (ref 40–150)
BUN: 22.6 mg/dL (ref 7.0–26.0)
CO2: 24 mEq/L (ref 22–29)
Calcium: 9.6 mg/dL (ref 8.4–10.4)
Chloride: 104 mEq/L (ref 98–109)
Creatinine: 0.9 mg/dL (ref 0.6–1.1)
EGFR: 60 mL/min/{1.73_m2} — AB (ref >90)
Glucose: 118 mg/dl (ref 70–140)
Potassium: 4 mEq/L (ref 3.5–5.1)
SODIUM: 137 meq/L (ref 136–145)
Total Bilirubin: 0.66 mg/dL (ref 0.20–1.20)
Total Protein: 7.1 g/dL (ref 6.4–8.3)

## 2017-05-31 LAB — CBC WITH DIFFERENTIAL/PLATELET
BASO%: 0.8 % (ref 0.0–2.0)
BASOS ABS: 0.1 10*3/uL (ref 0.0–0.1)
EOS%: 0.9 % (ref 0.0–7.0)
Eosinophils Absolute: 0.1 10*3/uL (ref 0.0–0.5)
HCT: 43.4 % (ref 34.8–46.6)
HEMOGLOBIN: 14.6 g/dL (ref 11.6–15.9)
LYMPH%: 30.8 % (ref 14.0–49.7)
MCH: 38 pg — AB (ref 25.1–34.0)
MCHC: 33.6 g/dL (ref 31.5–36.0)
MCV: 113 fL — ABNORMAL HIGH (ref 79.5–101.0)
MONO#: 0.7 10*3/uL (ref 0.1–0.9)
MONO%: 9.7 % (ref 0.0–14.0)
NEUT#: 4.3 10*3/uL (ref 1.5–6.5)
NEUT%: 57.8 % (ref 38.4–76.8)
Platelets: 580 10*3/uL — ABNORMAL HIGH (ref 145–400)
RBC: 3.84 10*6/uL (ref 3.70–5.45)
RDW: 14 % (ref 11.2–14.5)
WBC: 7.5 10*3/uL (ref 3.9–10.3)
lymph#: 2.3 10*3/uL (ref 0.9–3.3)

## 2017-05-31 NOTE — Progress Notes (Signed)
Hematology and Oncology Follow Up Visit  Anna Fuller [id number] 12/03/1945 71 y.o. 05/31/2017 8:31 AM      Principle Diagnosis: 71 year old female with essential thrombocythemia.  She has JAK2 positive myeloproliferative disorder diagnosed 2007.  Prior Therapy: 1. She is status post anagrelide therapy discontinued due to do intolerance. 2. Status post bone marrow biopsy in August 2008 which showed early signs of myeloproliferative disorder.  Current therapy:  She has been treated with hydroxyurea since 2009. She is currently taking alternating dosing of 1500 mg and 1000 mg every other day. Her dose increased to 1500 mg daily starting on 08/06/2016.  Interim History: Ms. Anna Fuller presents today for a followup visit. Since the last visit, she Reports no major changes in her health. She reports that constitutional symptoms that has been mild including night sweats and scalp pruritus. She remains on hydroxyurea to 1500 mg tolerated it reasonably well. She does have mild fatigue which does not interfere with her activities of daily living. She continues to be active and attends to activities of daily living. She denied any fevers or chills. She remains active including working outside despite the heat. Her performance status and stamina remains excellent.  She reports no blurry vision. No report any syncope or seizures. She is not reporting any fevers or chills. She has reported no lymphadenopathy. She had not reported any oral ulcers. Has not reported any chest pain or difficulty breathing. Has not reported any vomiting. She denied any frequency urgency or urination difficulties. She does report occasional arthralgias. Her rest of her review of system is unremarkable.  Medications: I have reviewed the patient's current medications. Current Outpatient Prescriptions  Medication Sig Dispense Refill  . aspirin 81 MG tablet Take 81 mg by mouth daily.        . cholecalciferol (VITAMIN D) 1000  UNITS tablet Take 2,000 Units by mouth daily.      Marland Kitchen estrogens, conjugated, (PREMARIN) 0.3 MG tablet Take 1 tablet (0.3 mg total) by mouth daily. Take daily for 21 days then do not take for 7 days.  30 tablet  11  . hydroxyurea (HYDREA) 500 MG capsule Take one tab twice a day.  Every other night take an additional tablet at night.  (Alternate 1057m and 15073m.  120 capsule  3  . simvastatin (ZOCOR) 40 MG tablet Take 1 tablet (40 mg total) by mouth at bedtime.  30 tablet  11       Allergies: Reviewed with the patient again today. Allergies  Allergen Reactions  . Codeine Nausea And Vomiting  . Iodine Anaphylaxis    Contrast dye  . Penicillins Other (See Comments)     Physical Exam:  Blood pressure (!) 124/56, pulse 65, temperature 98.2 F (36.8 C), temperature source Oral, resp. rate 18, weight 138 lb 1.6 oz (62.6 kg), SpO2 95 %. ECOG: 1 General appearance: Alert, awake: Without distress. Head:  No oral ulcers or lesions. Sclerae anicteric. Neck: no thyroid masses or lymphadenopathy. Lymph nodes: Cervical, supraclavicular, and axillary nodes normal.  Heart:regular rate and rhythm, S1, S2 normal.  Lung:chest clear, no wheezing, rales. Abdomin: Soft, nontender without rebound or guarding. No rebound or guarding.. EXT:no erythema, induration, or nodules.  Neurological examination: Intact without deficits. Skin examination: No rashes or lesions noted. No petechiae.  Lab Results: Lab Results  Component Value Date   WBC 7.5 05/31/2017   HGB 14.6 05/31/2017   HCT 43.4 05/31/2017   MCV 113.0 (H) 05/31/2017   PLT 580 (H) 05/31/2017  Chemistry      Component Value Date/Time   NA 141 03/29/2017 0753   K 4.3 03/29/2017 0753   CL 105 09/13/2016 1046   CL 105 01/30/2013 0825   CO2 27 03/29/2017 0753   BUN 17.1 03/29/2017 0753   CREATININE 0.9 03/29/2017 0753      Component Value Date/Time   CALCIUM 9.5 03/29/2017 0753   ALKPHOS 73 03/29/2017 0753   AST 24 03/29/2017 0753    ALT 24 03/29/2017 0753   BILITOT 0.43 03/29/2017 0753      Impression and Plan:   71 year old female with the following issues.  1. Myeloproliferative disorder presenting with essential thrombocythemia and JAK-2 positive mutation.  She is currently on hydroxyurea 1500 mg daily starting 08/06/2017. Platelet count remain under reasonable control without any need to adjust her dosing. The plan is to continue with the same dose and schedule. 2. Thrombosis prophylaxis. Continue to be on aspirin.  3. Constitutional symptoms: Appears to be manageable with conservative measures. We will continue to monitor and consider JAK 2 inhibitors in the future. 4. Age-appropriate cancer screening: She is up-to-date at this time. 5. Follow-up: Will be in 2 months.        Zola Button, MD 9/4/20188:31 AM

## 2017-05-31 NOTE — Telephone Encounter (Signed)
Scheduled appt per 9/4 los - Gave patient AVS and calender per los.  

## 2017-06-06 ENCOUNTER — Other Ambulatory Visit: Payer: Self-pay | Admitting: Internal Medicine

## 2017-06-06 DIAGNOSIS — Z1231 Encounter for screening mammogram for malignant neoplasm of breast: Secondary | ICD-10-CM

## 2017-06-09 DIAGNOSIS — Z23 Encounter for immunization: Secondary | ICD-10-CM | POA: Diagnosis not present

## 2017-07-22 ENCOUNTER — Ambulatory Visit: Payer: Medicare Other

## 2017-07-25 ENCOUNTER — Ambulatory Visit
Admission: RE | Admit: 2017-07-25 | Discharge: 2017-07-25 | Disposition: A | Discharge status: Home / Self Care | Payer: Medicare Other | Source: Ambulatory Visit | Attending: Internal Medicine | Admitting: Internal Medicine

## 2017-07-25 DIAGNOSIS — Z1231 Encounter for screening mammogram for malignant neoplasm of breast: Secondary | ICD-10-CM

## 2017-08-02 ENCOUNTER — Other Ambulatory Visit (HOSPITAL_BASED_OUTPATIENT_CLINIC_OR_DEPARTMENT_OTHER): Payer: Medicare Other

## 2017-08-02 ENCOUNTER — Telehealth: Payer: Self-pay

## 2017-08-02 ENCOUNTER — Ambulatory Visit (HOSPITAL_BASED_OUTPATIENT_CLINIC_OR_DEPARTMENT_OTHER): Payer: Medicare Other | Admitting: Oncology

## 2017-08-02 VITALS — BP 129/55 | HR 65 | Temp 98.2°F | Resp 20 | Ht 63.0 in | Wt 136.6 lb

## 2017-08-02 DIAGNOSIS — D473 Essential (hemorrhagic) thrombocythemia: Secondary | ICD-10-CM

## 2017-08-02 LAB — CBC WITH DIFFERENTIAL/PLATELET
BASO%: 0.7 % (ref 0.0–2.0)
Basophils Absolute: 0.1 10*3/uL (ref 0.0–0.1)
EOS%: 1 % (ref 0.0–7.0)
Eosinophils Absolute: 0.1 10*3/uL (ref 0.0–0.5)
HCT: 44.1 % (ref 34.8–46.6)
HGB: 14.7 g/dL (ref 11.6–15.9)
LYMPH%: 30.7 % (ref 14.0–49.7)
MCH: 37.3 pg — ABNORMAL HIGH (ref 25.1–34.0)
MCHC: 33.3 g/dL (ref 31.5–36.0)
MCV: 111.9 fL — AB (ref 79.5–101.0)
MONO#: 0.8 10*3/uL (ref 0.1–0.9)
MONO%: 11.7 % (ref 0.0–14.0)
NEUT#: 3.8 10*3/uL (ref 1.5–6.5)
NEUT%: 55.9 % (ref 38.4–76.8)
PLATELETS: 614 10*3/uL — AB (ref 145–400)
RBC: 3.94 10*6/uL (ref 3.70–5.45)
RDW: 14.2 % (ref 11.2–14.5)
WBC: 6.8 10*3/uL (ref 3.9–10.3)
lymph#: 2.1 10*3/uL (ref 0.9–3.3)

## 2017-08-02 LAB — COMPREHENSIVE METABOLIC PANEL
ALK PHOS: 81 U/L (ref 40–150)
ALT: 16 U/L (ref 0–55)
ANION GAP: 8 meq/L (ref 3–11)
AST: 25 U/L (ref 5–34)
Albumin: 4.1 g/dL (ref 3.5–5.0)
BUN: 15.2 mg/dL (ref 7.0–26.0)
CALCIUM: 9.4 mg/dL (ref 8.4–10.4)
CO2: 27 mEq/L (ref 22–29)
CREATININE: 0.9 mg/dL (ref 0.6–1.1)
Chloride: 106 mEq/L (ref 98–109)
Glucose: 90 mg/dl (ref 70–140)
Potassium: 4.1 mEq/L (ref 3.5–5.1)
Sodium: 140 mEq/L (ref 136–145)
Total Bilirubin: 0.41 mg/dL (ref 0.20–1.20)
Total Protein: 7 g/dL (ref 6.4–8.3)

## 2017-08-02 MED ORDER — HYDROXYUREA 500 MG PO CAPS: ORAL_CAPSULE | ORAL | 6 refills | Status: DC

## 2017-08-02 NOTE — Progress Notes (Signed)
Hematology and Oncology Follow Up Visit  Anna Fuller [id number] 06/19/1946 71 y.o. 08/02/2017 8:55 AM      Principle Diagnosis: 71 year old female with essential thrombocythemia.  She has JAK2 positive myeloproliferative disorder diagnosed 2007.  Prior Therapy: 1. She is status post anagrelide therapy discontinued due to do intolerance. 2. Status post bone marrow biopsy in August 2008 which showed early signs of myeloproliferative disorder.  Current therapy:  She has been treated with hydroxyurea since 2009. She is currently taking alternating dosing of 1500 mg and 1000 mg every other day. Her dose increased to 1500 mg daily starting on 08/06/2016.  Interim History: Anna Fuller presents today for a followup visit. Since the last visit, she continues to reports constitutional symptoms.  They have not dramatically changed and mostly pruritus and periodic neuropathy. She remains on hydroxyurea to 1500 mg tolerated it reasonably well. She does have mild fatigue which does not interfere with her activities of daily living. She continues to be active and attends to activities of daily living.  She denies any weight loss or changes in her performance status she remains active including working outside despite her symptoms.  She denies any bleeding or thrombosis episodes.  She reports no blurry vision. No report any syncope or seizures. She is not reporting any fevers or chills. She has reported no lymphadenopathy. She had not reported any oral ulcers. Has not reported any chest pain or difficulty breathing. Has not reported any vomiting. She denied any frequency urgency or urination difficulties. She does report occasional arthralgias. Her rest of her review of system is unremarkable.  Medications: I have reviewed the patient's current medications. Current Outpatient Prescriptions  Medication Sig Dispense Refill  . aspirin 81 MG tablet Take 81 mg by mouth daily.        . cholecalciferol  (VITAMIN D) 1000 UNITS tablet Take 2,000 Units by mouth daily.      Marland Kitchen estrogens, conjugated, (PREMARIN) 0.3 MG tablet Take 1 tablet (0.3 mg total) by mouth daily. Take daily for 21 days then do not take for 7 days.  30 tablet  11  . hydroxyurea (HYDREA) 500 MG capsule Take one tab twice a day.  Every other night take an additional tablet at night.  (Alternate 1034m and 15050m.  120 capsule  3  . simvastatin (ZOCOR) 40 MG tablet Take 1 tablet (40 mg total) by mouth at bedtime.  30 tablet  11       Allergies: Reviewed with the patient again today. Allergies  Allergen Reactions  . Codeine Nausea And Vomiting  . Iodine Anaphylaxis    Contrast dye  . Penicillins Other (See Comments)     Physical Exam:  Blood pressure (!) 129/55, pulse 65, temperature 98.2 F (36.8 C), temperature source Oral, resp. rate 20, height _0  (1.6 m), weight 136 lb 9.6 oz (62 kg), SpO2 100 %. ECOG: 1 General appearance: Well-appearing woman without distress. Head:  No oral ulcers or lesions. Sclerae anicteric. Neck: no thyroid masses or lymphadenopathy. Lymph nodes: Cervical, supraclavicular, and axillary nodes normal.  Heart:regular rate and rhythm, S1, S2 normal.  Lung:chest clear, no wheezing, rales. Abdomin: Soft, nontender without rebound or guarding. No shifting dullness or ascites. EXT:no erythema, induration, or nodules.  Neurological examination: Intact without deficits. Skin examination: No rashes or lesions noted. No petechiae.  Lab Results: Lab Results  Component Value Date   WBC 6.8 08/02/2017   HGB 14.7 08/02/2017   HCT 44.1 08/02/2017   MCV 111.9 (H)  08/02/2017   PLT 614 (H) 08/02/2017     Chemistry      Component Value Date/Time   NA 137 05/31/2017 0744   K 4.0 05/31/2017 0744   CL 105 09/13/2016 1046   CL 105 01/30/2013 0825   CO2 24 05/31/2017 0744   BUN 22.6 05/31/2017 0744   CREATININE 0.9 05/31/2017 0744      Component Value Date/Time   CALCIUM 9.6 05/31/2017 0744    ALKPHOS 79 05/31/2017 0744   AST 24 05/31/2017 0744   ALT 21 05/31/2017 0744   BILITOT 0.66 05/31/2017 0744      Impression and Plan:   71 year old with the following issues.  1. Myeloproliferative disorder presenting with essential thrombocythemia and JAK-2 positive mutation.  She is currently on hydroxyurea 1500 mg daily starting 08/06/2016. Platelet count remain under reasonable control without any need to adjust her dosing.  Despite the rise in her platelet count, I recommended no changes.  The goal is to keep her platelet count below 800. 2. Thrombosis prophylaxis. Continue to be on aspirin.  3. Constitutional symptoms: Appears to be manageable with conservative measures. We will continue to monitor and consider JAK 2 inhibitors in the future.  Risks and benefits associated with these medication were reviewed today she is agreeable to withhold any additional treatment for the time being. 4. Age-appropriate cancer screening: She is up-to-date at this time. 5. Follow-up: Will be in 2 months.        Zola Button, MD 11/6/20188:55 AM`

## 2017-08-02 NOTE — Telephone Encounter (Signed)
Patient declined avs and cal;ender  With follow up appointment information. Per 11/6 los

## 2017-09-13 ENCOUNTER — Other Ambulatory Visit: Payer: Self-pay | Admitting: Internal Medicine

## 2017-09-13 DIAGNOSIS — Z1329 Encounter for screening for other suspected endocrine disorder: Secondary | ICD-10-CM

## 2017-09-13 DIAGNOSIS — E785 Hyperlipidemia, unspecified: Secondary | ICD-10-CM

## 2017-09-13 DIAGNOSIS — Z1321 Encounter for screening for nutritional disorder: Secondary | ICD-10-CM

## 2017-09-13 DIAGNOSIS — Z0000 Encounter for general adult medical examination without abnormal findings: Secondary | ICD-10-CM

## 2017-09-16 ENCOUNTER — Other Ambulatory Visit: Payer: Medicare Other | Admitting: Internal Medicine

## 2017-09-16 DIAGNOSIS — Z1321 Encounter for screening for nutritional disorder: Secondary | ICD-10-CM

## 2017-09-16 DIAGNOSIS — E875 Hyperkalemia: Secondary | ICD-10-CM | POA: Diagnosis not present

## 2017-09-16 DIAGNOSIS — Z1329 Encounter for screening for other suspected endocrine disorder: Secondary | ICD-10-CM | POA: Diagnosis not present

## 2017-09-16 DIAGNOSIS — R011 Cardiac murmur, unspecified: Secondary | ICD-10-CM | POA: Diagnosis not present

## 2017-09-16 DIAGNOSIS — E2839 Other primary ovarian failure: Secondary | ICD-10-CM | POA: Diagnosis not present

## 2017-09-16 DIAGNOSIS — E785 Hyperlipidemia, unspecified: Secondary | ICD-10-CM | POA: Diagnosis not present

## 2017-09-16 DIAGNOSIS — Z0000 Encounter for general adult medical examination without abnormal findings: Secondary | ICD-10-CM

## 2017-09-16 DIAGNOSIS — R5383 Other fatigue: Secondary | ICD-10-CM | POA: Diagnosis not present

## 2017-09-16 DIAGNOSIS — D473 Essential (hemorrhagic) thrombocythemia: Secondary | ICD-10-CM | POA: Diagnosis not present

## 2017-09-17 LAB — LIPID PANEL
CHOLESTEROL: 169 mg/dL (ref <200)
HDL: 73 mg/dL (ref >50)
LDL CHOLESTEROL (CALC): 76 mg/dL
Non-HDL Cholesterol (Calc): 96 mg/dL (calc) (ref <130)
TRIGLYCERIDES: 113 mg/dL (ref <150)
Total CHOL/HDL Ratio: 2.3 (calc) (ref <5.0)

## 2017-09-17 LAB — COMPLETE METABOLIC PANEL WITH GFR
AG Ratio: 1.8 (calc) (ref 1.0–2.5)
ALBUMIN MSPROF: 4.6 g/dL (ref 3.6–5.1)
ALKALINE PHOSPHATASE (APISO): 71 U/L (ref 33–130)
ALT: 17 U/L (ref 6–29)
AST: 21 U/L (ref 10–35)
BILIRUBIN TOTAL: 0.6 mg/dL (ref 0.2–1.2)
BUN / CREAT RATIO: 20 (calc) (ref 6–22)
BUN: 19 mg/dL (ref 7–25)
CHLORIDE: 104 mmol/L (ref 98–110)
CO2: 26 mmol/L (ref 20–32)
Calcium: 10.1 mg/dL (ref 8.6–10.4)
Creat: 0.94 mg/dL — ABNORMAL HIGH (ref 0.60–0.93)
GFR, Est African American: 70 mL/min/{1.73_m2} (ref >=60)
GFR, Est Non African American: 60 mL/min/{1.73_m2} (ref >=60)
GLOBULIN: 2.6 g/dL (ref 1.9–3.7)
Glucose, Bld: 86 mg/dL (ref 65–99)
Potassium: 5.7 mmol/L — ABNORMAL HIGH (ref 3.5–5.3)
SODIUM: 140 mmol/L (ref 135–146)
Total Protein: 7.2 g/dL (ref 6.1–8.1)

## 2017-09-17 LAB — VITAMIN D 25 HYDROXY (VIT D DEFICIENCY, FRACTURES): VIT D 25 HYDROXY: 34 ng/mL (ref 30–100)

## 2017-09-17 LAB — CBC WITH DIFFERENTIAL/PLATELET
Basophils Absolute: 79 cells/uL (ref 0–200)
Basophils Relative: 1.1 %
EOS PCT: 1.3 %
Eosinophils Absolute: 94 cells/uL (ref 15–500)
HCT: 45.6 % — ABNORMAL HIGH (ref 35.0–45.0)
Hemoglobin: 16 g/dL — ABNORMAL HIGH (ref 11.7–15.5)
Lymphs Abs: 1757 cells/uL (ref 850–3900)
MCH: 37 pg — ABNORMAL HIGH (ref 27.0–33.0)
MCHC: 35.1 g/dL (ref 32.0–36.0)
MCV: 105.6 fL — AB (ref 80.0–100.0)
MPV: 8.7 fL (ref 7.5–12.5)
Monocytes Relative: 8.7 %
NEUTROS PCT: 64.5 %
Neutro Abs: 4644 cells/uL (ref 1500–7800)
PLATELETS: 691 10*3/uL — AB (ref 140–400)
RBC: 4.32 10*6/uL (ref 3.80–5.10)
RDW: 12.9 % (ref 11.0–15.0)
TOTAL LYMPHOCYTE: 24.4 %
WBC: 7.2 10*3/uL (ref 3.8–10.8)
WBCMIX: 626 {cells}/uL (ref 200–950)

## 2017-09-17 LAB — TSH: TSH: 2.06 m[IU]/L (ref 0.40–4.50)

## 2017-09-22 ENCOUNTER — Telehealth: Payer: Self-pay | Admitting: Internal Medicine

## 2017-09-22 ENCOUNTER — Encounter: Payer: Self-pay | Admitting: Internal Medicine

## 2017-09-22 ENCOUNTER — Ambulatory Visit (INDEPENDENT_AMBULATORY_CARE_PROVIDER_SITE_OTHER): Payer: Medicare Other | Admitting: Internal Medicine

## 2017-09-22 VITALS — BP 110/70 | HR 89 | Ht 62.0 in | Wt 140.0 lb

## 2017-09-22 DIAGNOSIS — Z1589 Genetic susceptibility to other disease: Secondary | ICD-10-CM | POA: Diagnosis not present

## 2017-09-22 DIAGNOSIS — E875 Hyperkalemia: Secondary | ICD-10-CM | POA: Diagnosis not present

## 2017-09-22 DIAGNOSIS — E7849 Other hyperlipidemia: Secondary | ICD-10-CM | POA: Diagnosis not present

## 2017-09-22 DIAGNOSIS — Z0000 Encounter for general adult medical examination without abnormal findings: Secondary | ICD-10-CM

## 2017-09-22 DIAGNOSIS — D473 Essential (hemorrhagic) thrombocythemia: Secondary | ICD-10-CM

## 2017-09-22 DIAGNOSIS — Z1389 Encounter for screening for other disorder: Secondary | ICD-10-CM

## 2017-09-22 MED ORDER — SIMVASTATIN 40 MG PO TABS: 40.0000 mg | ORAL_TABLET | Freq: Every day | ORAL | 3 refills | Status: DC

## 2017-09-22 NOTE — Telephone Encounter (Signed)
MEDICATION: simvastatin (ZOCOR) 40 MG tablet  PHARMACY:   [id number] - [place], [place] - [address] [phone] (Fax)    IS THIS A 90 DAY SUPPLY : n/a  IS PATIENT OUT OF MEDICATION: yes  IF NOT; HOW MUCH IS LEFT:   LAST APPOINTMENT DATE: @12 /27/2018  NEXT APPOINTMENT DATE:@Visit  date not found  OTHER COMMENTS:    **Let patient know to contact pharmacy at the end of the day to make sure medication is ready. **  ** Please notify patient to allow 48-72 hours to process**  **Encourage patient to contact the pharmacy for refills or they can request refills through [facility]**

## 2017-09-22 NOTE — Telephone Encounter (Signed)
Refill x one year °

## 2017-09-22 NOTE — Patient Instructions (Addendum)
It was a pleasure to see you today.  Have Dr. Alen Blew  repeat serum potassium next week.  Otherwise return in 1 year or as needed.

## 2017-09-22 NOTE — Progress Notes (Signed)
Subjective:    Patient ID: Anna Fuller, female    DOB: [birth year], 71 y.o.   MRN: [id number]  HPI 71 year old Female for health maintenance exam and evaluation of medical issues.  Hx of essential thrombosis  JAK2 myeloproliferative disorder diagnosed in 2007.  Bone marrow in 2008 showed early signs of myeloproliferative disorder.  Anagrelide was tried initially but patient did not tolerated.  Subsequently was changed to Hydrea.  Generally platelet counts have been in the 400-600,000 range but most recently platelet count was 691,000.  In July platelet count was 582,000.  In November platelet count was 614,000.  Dr. Alen Blew is watching her closely.  She is allergic to contrast dye cortisone and penicillin.  Past medical history: History of hyperlipidemia treated with simvastatin.  Long-standing history of estrogen replacement and does not want to be taken off of it.  Hospitalized with fever of unknown origin in 61.  Cholecystectomy 1983.  Hysterectomy with bilateral oophorectomy 1991.  Right wrist surgery 2008.  Foot surgery of both feet 1965, 1968, 1969.  Has had left and right knee surgeries.  Social history: She is single and retired.  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  She completed 2 years of college.  Does not smoke or consume alcohol.  Enjoys walking and doing yard work.  Family history: Her sister is Anna Fuller who is also a patient here.  No other siblings.  Father died at age 87 with complications of diabetes.  Mother died at age 86 of pulmonary fibrosis.  She has had Zostavax vaccine and both pneumococcal vaccines.  His annual flu vaccine.  She had tetanus immunization in 2006 but had a significant local reaction so that will not be repeated.         Review of Systems feels fatigued     Objective:   Physical Exam  Constitutional: She is oriented to person, place, and time. She appears  well-developed and well-nourished. No distress.  HENT:  Head: Normocephalic and atraumatic.  Right Ear: External ear normal.  Left Ear: External ear normal.  Mouth/Throat: Oropharynx is clear and moist.  Eyes: Conjunctivae and EOM are normal. Pupils are equal, round, and reactive to light. Right eye exhibits no discharge. Left eye exhibits no discharge. No scleral icterus.  Neck: Neck supple. No JVD present. No thyromegaly present.  Cardiovascular: Normal rate, regular rhythm, normal heart sounds and intact distal pulses.  No murmur heard. Pulmonary/Chest: Effort normal and breath sounds normal. No respiratory distress. She has no wheezes. She has no rales. She exhibits no tenderness.  Breasts normal female  Abdominal: Soft. Bowel sounds are normal. She exhibits no distension and no mass. There is no tenderness. There is no rebound and no guarding.  Genitourinary:  Genitourinary Comments: Status post hysterectomy with BSO 1991  Musculoskeletal: She exhibits no edema.  Lymphadenopathy:    She has no cervical adenopathy.  Neurological: She is alert and oriented to person, place, and time. She has normal reflexes. No cranial nerve deficit. Coordination normal.  Skin: Skin is warm and dry. No rash noted. She is not diaphoretic.  Psychiatric: She has a normal mood and affect. Her behavior is normal. Judgment and thought content normal.          Assessment & Plan:  Essential thrombosis-platelet count rising.  Followed by hematologist/oncologist.  Melburn Popper 2 myeloproliferative disorder  Hyperlipidemia-treated with statin medication  Estrogen replacement  Her potassium is elevated and that  may well be a lab error or hemolysis.  She would not consent to have it repeated today.  Says Dr. Alen Blew will check it next week.  Subjective:   Patient presents for Medicare Annual/Subsequent preventive examination.  Review Past Medical/Family/Social: See above   Risk Factors  Current exercise  habits: Walks and does yard work Dietary issues discussed: Low-fat low carbohydrate  Cardiac risk factors: Hyperlipidemia  Depression Screen  (Note: if answer to either of the following is "Yes", a more complete depression screening is indicated)   Over the past two weeks, have you felt down, depressed or hopeless? No  Over the past two weeks, have you felt little interest or pleasure in doing things? No Have you lost interest or pleasure in daily life? No Do you often feel hopeless? No Do you cry easily over simple problems? No   Activities of Daily Living  In your present state of health, do you have any difficulty performing the following activities?:   Driving? No  Managing money? No  Feeding yourself? No  Getting from bed to chair? No  Climbing a flight of stairs? No  Preparing food and eating?: No  Bathing or showering? No  Getting dressed: No  Getting to the toilet? No  Using the toilet:No  Moving around from place to place: No  In the past year have you fallen or had a near fall?:No  Are you sexually active? No  Do you have more than one partner? No   Hearing Difficulties: No  Do you often ask people to speak up or repeat themselves? No  Do you experience ringing or noises in your ears?  Yes Do you have difficulty understanding soft or whispered voices? No  Do you feel that you have a problem with memory? No Do you often misplace items? No    Home Safety:  Do you have a smoke alarm at your residence? Yes Do you have grab bars in the bathroom?  No Do you have throw rugs in your house?  No   Cognitive Testing  Alert? Yes Normal Appearance?Yes  Oriented to person? Yes Place? Yes  Time? Yes  Recall of three objects? Yes  Can perform simple calculations? Yes  Displays appropriate judgment?Yes  Can read the correct time from a watch face?Yes   List the Names of Other Physician/Practitioners you currently use:  See referral list for the physicians patient is  currently seeing.     Review of Systems: See above   Objective:     General appearance: Appears stated age and slender Head: Normocephalic, without obvious abnormality, atraumatic  Eyes: conj clear, EOMi PEERLA  Ears: normal TM's and external ear canals both ears  Nose: Nares normal. Septum midline. Mucosa normal. No drainage or sinus tenderness.  Throat: lips, mucosa, and tongue normal; teeth and gums normal  Neck: no adenopathy, no carotid bruit, no JVD, supple, symmetrical, trachea midline and thyroid not enlarged, symmetric, no tenderness/mass/nodules  No CVA tenderness.  Lungs: clear to auscultation bilaterally  Breasts: normal appearance, no masses or tenderness Heart: regular rate and rhythm, S1, S2 normal, no murmur, click, rub or gallop  Abdomen: soft, non-tender; bowel sounds normal; no masses, no organomegaly  Musculoskeletal: ROM normal in all joints, no crepitus, no deformity, Normal muscle strengthen. Back  is symmetric, no curvature. Skin: Skin color, texture, turgor normal. No rashes or lesions  Lymph nodes: Cervical, supraclavicular, and axillary nodes normal.  Neurologic: CN 2 -12 Normal, Normal symmetric reflexes. Normal coordination and  gait  Psych: Alert & Oriented x 3, Mood appear stable.    Assessment:    Annual wellness medicare exam   Plan:    During the course of the visit the patient was educated and counseled about appropriate screening and preventive services including:   See above     Patient Instructions (the written plan) was given to the patient.  Medicare Attestation  I have personally reviewed:  The patient's medical and social history  Their use of alcohol, tobacco or illicit drugs  Their current medications and supplements  The patient's functional ability including ADLs,fall risks, home safety risks, cognitive, and hearing and visual impairment  Diet and physical activities  Evidence for depression or mood disorders  The  patient's weight, height, BMI, and visual acuity have been recorded in the chart. I have made referrals, counseling, and provided education to the patient based on review of the above and I have provided the patient with a written personalized care plan for preventive services.

## 2017-09-28 ENCOUNTER — Ambulatory Visit (HOSPITAL_BASED_OUTPATIENT_CLINIC_OR_DEPARTMENT_OTHER): Payer: Medicare Other | Admitting: Oncology

## 2017-09-28 ENCOUNTER — Telehealth: Payer: Self-pay | Admitting: *Deleted

## 2017-09-28 ENCOUNTER — Telehealth: Payer: Self-pay | Admitting: Oncology

## 2017-09-28 ENCOUNTER — Other Ambulatory Visit (HOSPITAL_BASED_OUTPATIENT_CLINIC_OR_DEPARTMENT_OTHER): Payer: Medicare Other

## 2017-09-28 VITALS — BP 135/56 | HR 69 | Temp 98.3°F | Resp 18 | Ht 62.0 in | Wt 139.8 lb

## 2017-09-28 DIAGNOSIS — D473 Essential (hemorrhagic) thrombocythemia: Secondary | ICD-10-CM

## 2017-09-28 DIAGNOSIS — E875 Hyperkalemia: Secondary | ICD-10-CM

## 2017-09-28 LAB — CBC WITH DIFFERENTIAL/PLATELET
BASO%: 0.8 % (ref 0.0–2.0)
Basophils Absolute: 0.1 10*3/uL (ref 0.0–0.1)
EOS%: 1.5 % (ref 0.0–7.0)
Eosinophils Absolute: 0.1 10*3/uL (ref 0.0–0.5)
HEMATOCRIT: 45.7 % (ref 34.8–46.6)
HGB: 15.1 g/dL (ref 11.6–15.9)
LYMPH#: 1.8 10*3/uL (ref 0.9–3.3)
LYMPH%: 28.4 % (ref 14.0–49.7)
MCH: 36.7 pg — ABNORMAL HIGH (ref 25.1–34.0)
MCHC: 33 g/dL (ref 31.5–36.0)
MCV: 110.9 fL — ABNORMAL HIGH (ref 79.5–101.0)
MONO#: 0.6 10*3/uL (ref 0.1–0.9)
MONO%: 9.4 % (ref 0.0–14.0)
NEUT#: 3.7 10*3/uL (ref 1.5–6.5)
NEUT%: 59.9 % (ref 38.4–76.8)
PLATELETS: 530 10*3/uL — AB (ref 145–400)
RBC: 4.12 10*6/uL (ref 3.70–5.45)
RDW: 14.2 % (ref 11.2–14.5)
WBC: 6.2 10*3/uL (ref 3.9–10.3)

## 2017-09-28 LAB — COMPREHENSIVE METABOLIC PANEL
ALT: 23 U/L (ref 0–55)
AST: 23 U/L (ref 5–34)
Albumin: 4.2 g/dL (ref 3.5–5.0)
Alkaline Phosphatase: 75 U/L (ref 40–150)
Anion Gap: 9 mEq/L (ref 3–11)
BILIRUBIN TOTAL: 0.46 mg/dL (ref 0.20–1.20)
BUN: 14.6 mg/dL (ref 7.0–26.0)
CO2: 25 meq/L (ref 22–29)
Calcium: 9.1 mg/dL (ref 8.4–10.4)
Chloride: 106 mEq/L (ref 98–109)
Creatinine: 0.9 mg/dL (ref 0.6–1.1)
Glucose: 88 mg/dl (ref 70–140)
Potassium: 3.8 mEq/L (ref 3.5–5.1)
Sodium: 140 mEq/L (ref 136–145)
TOTAL PROTEIN: 7 g/dL (ref 6.4–8.3)

## 2017-09-28 NOTE — Telephone Encounter (Signed)
-----   Message from Wyatt Portela, MD sent at 09/28/2017 10:25 AM EST ----- Please let her know her K is normal.

## 2017-09-28 NOTE — Progress Notes (Signed)
Hematology and Oncology Follow Up Visit  Anna Fuller [id number] 05/23/1946 72 y.o. 09/28/2017 8:38 AM      Principle Diagnosis: 72 year old female with essential thrombocythemia.  She has JAK2 positive myeloproliferative disorder diagnosed 2007.  Prior Therapy: 1. She is status post anagrelide therapy discontinued due to do intolerance. 2. Status post bone marrow biopsy in August 2008 which showed early signs of myeloproliferative disorder.  Current therapy:  She has been treated with hydroxyurea since 2009. She is currently taking alternating dosing of 1500 mg and 1000 mg every other day. Her dose increased to 1500 mg daily starting on 08/06/2016.  Interim History: Anna Fuller presents today for a followup visit. Since the last visit, she reports feeling well without any changes.  She had a physical and an annual checkup with Dr. Renold Genta last month.  Her potassium was mildly elevated and will be repeated today.    She remains on hydroxyurea to 1500 mg tolerated it well.  She does not report any new side effects and feels overall about the same. She does have mild fatigue which does not interfere with her activities of daily living.  He was able to shovel snow without any major difficulties.  She continues to be active and attends to activities of daily living.  She denies any fevers, chills, abdominal fullness or early satiety.  She reports no blurry vision. No report any syncope or seizures. She is not reporting any fevers or chills. She has reported no lymphadenopathy. She had not reported any oral ulcers. Has not reported any chest pain or difficulty breathing. Has not reported any vomiting. She denied any frequency urgency or urination difficulties. She does report occasional arthralgias. Her rest of her review of system is unremarkable.  Medications: I have reviewed the patient's current medications. Current Outpatient Prescriptions  Medication Sig Dispense Refill  . aspirin 81 MG  tablet Take 81 mg by mouth daily.        . cholecalciferol (VITAMIN D) 1000 UNITS tablet Take 2,000 Units by mouth daily.      Marland Kitchen estrogens, conjugated, (PREMARIN) 0.3 MG tablet Take 1 tablet (0.3 mg total) by mouth daily. Take daily for 21 days then do not take for 7 days.  30 tablet  11  . hydroxyurea (HYDREA) 500 MG capsule Take one tab twice a day.  Every other night take an additional tablet at night.  (Alternate 1029m and 15020m.  120 capsule  3  . simvastatin (ZOCOR) 40 MG tablet Take 1 tablet (40 mg total) by mouth at bedtime.  30 tablet  11       Allergies: Reviewed with the patient again today. Allergies  Allergen Reactions  . Codeine Nausea And Vomiting  . Iodine Anaphylaxis    Contrast dye  . Penicillins Other (See Comments)     Physical Exam:  Blood pressure (!) 135/56, pulse 69, temperature 98.3 F (36.8 C), temperature source Oral, resp. rate 18, height 5' 2"  (1.575 m), weight 139 lb 12.8 oz (63.4 kg), SpO2 99 %. ECOG: 1 General appearance: Alert, awake woman without distress. Head: Atraumatic.  Sclera anicteric. Oral mucosa is without ulcers or lesions. Neck: no thyroid masses  Lymph nodes: Cervical, supraclavicular, and axillary nodes normal.  Heart:regular rate and rhythm, S1, S2 normal.  Without murmurs. Lung:chest clear, no wheezing, rales.  No dullness to percussion. Abdomin: Soft, nontender without rebound or guarding. No splenomegaly palpated. EXT:no erythema, induration, or nodules.  Neurological examination: Intact without deficits. Skin examination: No rashes or  lesions noted. No petechiae.  Lab Results: Lab Results  Component Value Date   WBC 6.2 09/28/2017   HGB 15.1 09/28/2017   HCT 45.7 09/28/2017   MCV 110.9 (H) 09/28/2017   PLT 530 (H) 09/28/2017     Chemistry      Component Value Date/Time   NA 140 09/16/2017 1054   NA 140 08/02/2017 0821   K 5.7 (H) 09/16/2017 1054   K 4.1 08/02/2017 0821   CL 104 09/16/2017 1054   CL 105  01/30/2013 0825   CO2 26 09/16/2017 1054   CO2 27 08/02/2017 0821   BUN 19 09/16/2017 1054   BUN 15.2 08/02/2017 0821   CREATININE 0.94 (H) 09/16/2017 1054   CREATININE 0.9 08/02/2017 0821      Component Value Date/Time   CALCIUM 10.1 09/16/2017 1054   CALCIUM 9.4 08/02/2017 0821   ALKPHOS 81 08/02/2017 0821   AST 21 09/16/2017 1054   AST 25 08/02/2017 0821   ALT 17 09/16/2017 1054   ALT 16 08/02/2017 0821   BILITOT 0.6 09/16/2017 1054   BILITOT 0.41 08/02/2017 0821      Impression and Plan:   72 year old with the following issues.  1. Myeloproliferative disorder presenting with essential thrombocythemia and JAK-2 positive mutation.  She is currently on hydroxyurea 1500 mg daily starting 08/06/2016. Platelet count continues to be under reasonable control.  Currently at 530 and has declined since the last few months.  The plan is to continue hydroxyurea at the same dose and schedule. 2. Thrombosis prophylaxis. Continue to be on aspirin.  3. Constitutional symptoms: No new constitutional symptoms noted. We will continue to monitor and consider JAK 2 inhibitors in the future.  . 4. Age-appropriate cancer screening: She is up-to-date at this time. 5. Hyperkalemia: Potassium will be rechecked today and will communicate these findings to her and her primary care physician. 6. Follow-up: Will be in 2 months.        Zola Button, MD 1/2/20198:38 AM`

## 2017-09-28 NOTE — Telephone Encounter (Signed)
Lm on answering machine, that potassium is normal.

## 2017-09-28 NOTE — Telephone Encounter (Signed)
Scheduled appts per 1/2 los - patient did not want avs or calendar.

## 2017-09-29 ENCOUNTER — Telehealth: Payer: Self-pay | Admitting: Internal Medicine

## 2017-09-29 NOTE — Telephone Encounter (Signed)
Patient had her K+ repeated yesterday at Dr. Hazeline Junker office.  The K+ came back at 3.8.  She just wanted to call to let you know that it was normal.  She said thank you for your concern for her.

## 2017-11-30 ENCOUNTER — Inpatient Hospital Stay: Payer: Medicare Other

## 2017-11-30 ENCOUNTER — Telehealth: Payer: Self-pay

## 2017-11-30 ENCOUNTER — Inpatient Hospital Stay: Payer: Medicare Other | Attending: Oncology | Admitting: Oncology

## 2017-11-30 VITALS — BP 128/58 | HR 70 | Temp 97.9°F | Resp 18 | Ht 62.0 in | Wt 140.6 lb

## 2017-11-30 DIAGNOSIS — D473 Essential (hemorrhagic) thrombocythemia: Secondary | ICD-10-CM

## 2017-11-30 DIAGNOSIS — Z7982 Long term (current) use of aspirin: Secondary | ICD-10-CM | POA: Insufficient documentation

## 2017-11-30 DIAGNOSIS — Z79899 Other long term (current) drug therapy: Secondary | ICD-10-CM | POA: Diagnosis not present

## 2017-11-30 LAB — COMPREHENSIVE METABOLIC PANEL
ALBUMIN: 4.2 g/dL (ref 3.5–5.0)
ALK PHOS: 76 U/L (ref 40–150)
ALT: 24 U/L (ref 0–55)
ANION GAP: 10 (ref 3–11)
AST: 30 U/L (ref 5–34)
BILIRUBIN TOTAL: 0.5 mg/dL (ref 0.2–1.2)
BUN: 17 mg/dL (ref 7–26)
CALCIUM: 9.5 mg/dL (ref 8.4–10.4)
CO2: 24 mmol/L (ref 22–29)
Chloride: 106 mmol/L (ref 98–109)
Creatinine, Ser: 1.02 mg/dL (ref 0.60–1.10)
GFR, EST NON AFRICAN AMERICAN: 53 mL/min — AB (ref >60)
Glucose, Bld: 92 mg/dL (ref 70–140)
POTASSIUM: 4.3 mmol/L (ref 3.5–5.1)
Sodium: 140 mmol/L (ref 136–145)
TOTAL PROTEIN: 7.1 g/dL (ref 6.4–8.3)

## 2017-11-30 LAB — CBC WITH DIFFERENTIAL/PLATELET
BASOS PCT: 1 %
Basophils Absolute: 0.1 10*3/uL (ref 0.0–0.1)
Eosinophils Absolute: 0.1 10*3/uL (ref 0.0–0.5)
Eosinophils Relative: 1 %
HEMATOCRIT: 45.7 % (ref 34.8–46.6)
HEMOGLOBIN: 15.2 g/dL (ref 11.6–15.9)
LYMPHS ABS: 2 10*3/uL (ref 0.9–3.3)
Lymphocytes Relative: 30 %
MCH: 36.9 pg — AB (ref 25.1–34.0)
MCHC: 33.3 g/dL (ref 31.5–36.0)
MCV: 110.9 fL — ABNORMAL HIGH (ref 79.5–101.0)
MONO ABS: 0.7 10*3/uL (ref 0.1–0.9)
MONOS PCT: 10 %
NEUTROS ABS: 4 10*3/uL (ref 1.5–6.5)
Neutrophils Relative %: 58 %
Platelets: 586 10*3/uL — ABNORMAL HIGH (ref 145–400)
RBC: 4.12 MIL/uL (ref 3.70–5.45)
RDW: 14.7 % — AB (ref 11.2–14.5)
WBC: 6.8 10*3/uL (ref 3.9–10.3)

## 2017-11-30 NOTE — Progress Notes (Signed)
Hematology and Oncology Follow Up Visit  Anna Fuller [id number] 05/20/1946 72 y.o. 11/30/2017 8:00 AM      Principle Diagnosis: 72 year old woman with JAK2 positive myeloproliferative disorder in the form of essential thrombocythemia.  She presented with elevated platelet count in 2007. Bone marrow biopsy obtained at the time confirm the diagnosis.  Prior Therapy:   She is status post Anagrelide therapy discontinued due to do intolerance.   Current therapy:  Hydroxyurea since 2009.  Her dose increased to 1500 mg daily starting on 08/06/2016.  Interim History: Ms. Haverstick presents today for a follow-up visit. She reports no major changes in her health since the last visit. She did have an episode of diffuse arthralgias that lasted for a week she did take ibuprofen which helped her symptoms. She denied any fevers, chills or sweats. She denied any pathological fractures or persistent bone pain. She remains active and attends activities of daily living.  She continues to take hydroxyurea at the current dose without any new side effects. She continues to report scalp numbness but no mouth sores or nausea. Her appetite remains reasonable and weight is stable.  She does not report any headaches, blurry vision, syncope or seizures. Does not report any cough, wheezing or hemoptysis.  Does not report any chest pain, palpitation, orthopnea or leg edema.  Does not report any nausea, vomiting or abdominal pain.  Does not report any constipation or diarrhea.   Does not report frequency, urgency or hematuria.  Does not report any skin rashes or lesions. Does not report any lymphadenopathy or petechiae.   Remaining review of systems is negative.     Medications: I have reviewed the patient's current medications. Current Outpatient Prescriptions  Medication Sig Dispense Refill  . aspirin 81 MG tablet Take 81 mg by mouth daily.        . cholecalciferol (VITAMIN D) 1000 UNITS tablet Take 2,000 Units  by mouth daily.      Marland Kitchen estrogens, conjugated, (PREMARIN) 0.3 MG tablet Take 1 tablet (0.3 mg total) by mouth daily. Take daily for 21 days then do not take for 7 days.  30 tablet  11  . hydroxyurea (HYDREA) 500 MG capsule Take one tab twice a day.  Every other night take an additional tablet at night.  (Alternate 1015m and 15090m.  120 capsule  3  . simvastatin (ZOCOR) 40 MG tablet Take 1 tablet (40 mg total) by mouth at bedtime.  30 tablet  11       Allergies: Reviewed with the patient again today. Allergies  Allergen Reactions  . Codeine Nausea And Vomiting  . Iodine Anaphylaxis    Contrast dye  . Penicillins Other (See Comments)     Physical Exam: Blood pressure (!) 128/58, pulse 70, temperature 97.9 F (36.6 C), temperature source Oral, resp. rate 18, height 5' 2"  (1.575 m), weight 140 lb 9.6 oz (63.8 kg), SpO2 100 %.   ECOG: 1 General appearance: Well-appearing woman without distress. Head: Without any abnormalities. Eyes: Sclerae anicteric. Oral mucosa no oral thrush or ulcers. Lymph nodes: Cervical, supraclavicular, and axillary nodes normal.  Heart: Regular rate and rhythm without murmurs rubs or gallops. Lung: Clear to auscultation without any rhonchi, wheezes or dullness to percussion. Abdomin: Soft, nontender, nondistended with good bowel sounds. No shifting dullness or ascites. Musculoskeletal: No joint deformity or effusion. Poor range of motion. Neurological examination: Motor and sensory examination is intact. Skin examination: No ecchymosis or petechiae.  Lab Results: Lab Results  Component Value  Date   WBC 6.8 11/30/2017   HGB 15.2 11/30/2017   HCT 45.7 11/30/2017   MCV 110.9 (H) 11/30/2017   PLT 586 (H) 11/30/2017     Chemistry      Component Value Date/Time   NA 140 09/28/2017 0811   K 3.8 09/28/2017 0811   CL 104 09/16/2017 1054   CL 105 01/30/2013 0825   CO2 25 09/28/2017 0811   BUN 14.6 09/28/2017 0811   CREATININE 0.9 09/28/2017 0811       Component Value Date/Time   CALCIUM 9.1 09/28/2017 0811   ALKPHOS 75 09/28/2017 0811   AST 23 09/28/2017 0811   ALT 23 09/28/2017 0811   BILITOT 0.46 09/28/2017 0811      Impression and Plan:   72 year old with the following issues.  1. JAK-2 positive myeloproliferative disorder presenting with thrombocythemia. She has been on hydroxyurea to maintain a platelet count close to 600. Her platelet count today is 586 and the plan is to continue with the same dose and schedule of hydroxyurea. Risks and benefits of continuing this medication long-term was reiterated and she is agreeable to continue today. 2. Thrombosis prophylaxis. Her risk of thrombosis is low given her normal white cell count and no thrombosis history. I recommended continue low-dose aspirin. 3. Constitutional symptoms: He is noted at this time but remain manageable. No signs or symptoms of transformation of her condition. 4. Age-appropriate cancer screening: She is due for colonoscopy in the near future NA curvature to do that. 5. Hyperkalemia: Resolved at this time. Potassium in January 2019 was normal. 6. Follow-up: Will be in 2 months.  15  minutes was spent with the patient face-to-face today.  More than 50% of time was dedicated to patient counseling, education and coordination of her care.       Zola Button, MD 3/6/20198:00 AM`

## 2017-11-30 NOTE — Telephone Encounter (Signed)
Printed avs and calender of upcoming appointment. Per 3/6 los 

## 2018-01-26 ENCOUNTER — Telehealth: Payer: Self-pay

## 2018-01-26 ENCOUNTER — Inpatient Hospital Stay: Payer: Medicare Other | Attending: Oncology | Admitting: Oncology

## 2018-01-26 ENCOUNTER — Inpatient Hospital Stay: Payer: Medicare Other

## 2018-01-26 VITALS — BP 126/43 | HR 66 | Temp 98.3°F | Resp 18 | Ht 62.0 in | Wt 139.9 lb

## 2018-01-26 DIAGNOSIS — Z7982 Long term (current) use of aspirin: Secondary | ICD-10-CM | POA: Diagnosis not present

## 2018-01-26 DIAGNOSIS — Z79899 Other long term (current) drug therapy: Secondary | ICD-10-CM | POA: Diagnosis not present

## 2018-01-26 DIAGNOSIS — D473 Essential (hemorrhagic) thrombocythemia: Secondary | ICD-10-CM

## 2018-01-26 DIAGNOSIS — E875 Hyperkalemia: Secondary | ICD-10-CM | POA: Diagnosis not present

## 2018-01-26 LAB — CBC WITH DIFFERENTIAL (CANCER CENTER ONLY)
Basophils Absolute: 0.1 10*3/uL (ref 0.0–0.1)
Basophils Relative: 1 %
EOS ABS: 0.1 10*3/uL (ref 0.0–0.5)
Eosinophils Relative: 1 %
HCT: 40.8 % (ref 34.8–46.6)
HEMOGLOBIN: 13.9 g/dL (ref 11.6–15.9)
LYMPHS ABS: 1.6 10*3/uL (ref 0.9–3.3)
Lymphocytes Relative: 32 %
MCH: 38 pg — AB (ref 25.1–34.0)
MCHC: 34 g/dL (ref 31.5–36.0)
MCV: 111.6 fL — ABNORMAL HIGH (ref 79.5–101.0)
Monocytes Absolute: 0.5 10*3/uL (ref 0.1–0.9)
Monocytes Relative: 10 %
NEUTROS PCT: 56 %
Neutro Abs: 2.9 10*3/uL (ref 1.5–6.5)
Platelet Count: 524 10*3/uL — ABNORMAL HIGH (ref 145–400)
RBC: 3.66 MIL/uL — AB (ref 3.70–5.45)
RDW: 14.6 % — ABNORMAL HIGH (ref 11.2–14.5)
WBC: 5.1 10*3/uL (ref 3.9–10.3)

## 2018-01-26 LAB — CMP (CANCER CENTER ONLY)
ALT: 13 U/L (ref 0–55)
ANION GAP: 9 (ref 3–11)
AST: 22 U/L (ref 5–34)
Albumin: 4.2 g/dL (ref 3.5–5.0)
Alkaline Phosphatase: 66 U/L (ref 40–150)
BUN: 15 mg/dL (ref 7–26)
CHLORIDE: 107 mmol/L (ref 98–109)
CO2: 24 mmol/L (ref 22–29)
CREATININE: 1 mg/dL (ref 0.60–1.10)
Calcium: 9.3 mg/dL (ref 8.4–10.4)
GFR, EST NON AFRICAN AMERICAN: 55 mL/min — AB (ref >60)
Glucose, Bld: 93 mg/dL (ref 70–140)
POTASSIUM: 4.2 mmol/L (ref 3.5–5.1)
Sodium: 140 mmol/L (ref 136–145)
Total Bilirubin: 0.5 mg/dL (ref 0.2–1.2)
Total Protein: 6.7 g/dL (ref 6.4–8.3)

## 2018-01-26 NOTE — Telephone Encounter (Signed)
Patient declined avs and calender. She has MyChart per [employer]

## 2018-01-26 NOTE — Progress Notes (Signed)
Hematology and Oncology Follow Up Visit  Anna Fuller [id number] 1946-04-23 72 y.o. 01/26/2018 9:37 AM      Principle Diagnosis: 72 year old woman with JAK2 positive essential thrombocythemia diagnosed in 2007.  Prior Therapy:   She is status post Anagrelide therapy discontinued due to do intolerance.   Current therapy:  Hydroxyurea since 2009.  Her dose increased to 1500 mg daily starting on 08/06/2016.  Interim History: Anna Fuller is here for a follow-up visit.  Since last visit, she reports no major changes in her health.  She reports more energy and exercise tolerance and has been able to work in the yard regularly.  She denies any thrombosis or bleeding episodes.  She denies any new complications related to hydroxyurea.  He denies any oral ulcers or lesions.  She denies any dermatological complications.  Her performance status and quality of life remain unchanged.  She does not report any headaches, blurry vision, syncope or seizures.  She denies any fevers, chills or sweats.  Does not report any cough, wheezing or hemoptysis.  Does not report any chest pain, palpitation, orthopnea or leg edema.  Does not report any nausea, vomiting or abdominal pain.  Does not report any constipation or diarrhea.   Does not report frequency, urgency or hematuria.  Does not report any skin rashes or lesions. Does not report any lymphadenopathy or petechiae.  She denies any anxiety or depression.  Remaining review of systems is negative.     Medications: I have reviewed the patient's current medications. Current Outpatient Prescriptions  Medication Sig Dispense Refill  . aspirin 81 MG tablet Take 81 mg by mouth daily.        . cholecalciferol (VITAMIN D) 1000 UNITS tablet Take 2,000 Units by mouth daily.      Marland Kitchen estrogens, conjugated, (PREMARIN) 0.3 MG tablet Take 1 tablet (0.3 mg total) by mouth daily. Take daily for 21 days then do not take for 7 days.  30 tablet  11  . hydroxyurea (HYDREA) 500  MG capsule Take one tab twice a day.  Every other night take an additional tablet at night.  (Alternate 1000mg  and 1500mg ).  120 capsule  3  . simvastatin (ZOCOR) 40 MG tablet Take 1 tablet (40 mg total) by mouth at bedtime.  30 tablet  11       Allergies: Reviewed with the patient again today. Allergies  Allergen Reactions  . Codeine Nausea And Vomiting  . Iodine Anaphylaxis    Contrast dye  . Penicillins Other (See Comments)     Physical Exam: Blood pressure (!) 126/43, pulse 66, temperature 98.3 F (36.8 C), temperature source Oral, resp. rate 18, height 5\' 2"  (1.575 m), weight 139 lb 14.4 oz (63.5 kg), SpO2 99 %.   ECOG: 1 General appearance: Alert, awake woman without distress. Head: Atraumatic without abnormalities. Eyes: Nipples are equal and round reactive to light. Oral mucosa no oral ulcers or lesions. Lymph nodes: No lymphadenopathy noted in the cervical, supraclavicular, and axillary nodes  Heart: Regular rate without murmurs or gallops. Lung: Clear in all lung fields without any wheezes or dullness to percussion. Abdomin: Soft, nontender, good bowel sounds and no shifting dullness. Musculoskeletal: No clubbing or cyanosis. Neurological examination: No motor or sensory deficits. Skin examination: No skin rashes or lesions.  No petechiae.  Lab Results: Lab Results  Component Value Date   WBC 5.1 01/26/2018   HGB 13.9 01/26/2018   HCT 40.8 01/26/2018   MCV 111.6 (H) 01/26/2018   PLT  524 (H) 01/26/2018     Chemistry      Component Value Date/Time   NA 140 11/30/2017 0744   NA 140 09/28/2017 0811   K 4.3 11/30/2017 0744   K 3.8 09/28/2017 0811   CL 106 11/30/2017 0744   CL 105 01/30/2013 0825   CO2 24 11/30/2017 0744   CO2 25 09/28/2017 0811   BUN 17 11/30/2017 0744   BUN 14.6 09/28/2017 0811   CREATININE 1.02 11/30/2017 0744   CREATININE 0.9 09/28/2017 0811      Component Value Date/Time   CALCIUM 9.5 11/30/2017 0744   CALCIUM 9.1 09/28/2017 0811    ALKPHOS 76 11/30/2017 0744   ALKPHOS 75 09/28/2017 0811   AST 30 11/30/2017 0744   AST 23 09/28/2017 0811   ALT 24 11/30/2017 0744   ALT 23 09/28/2017 0811   BILITOT 0.5 11/30/2017 0744   BILITOT 0.46 09/28/2017 0811      Impression and Plan:   72 year old woman with:   1. Essential thrombocythemia with JAK-2 positive mutation: She is currently on hydroxyurea to keep her platelet count and a reasonable control.  Her platelet count today at 524 is reasonably controlled without reason for dose adjustments.  Risks and benefits of continuing hydroxyurea was reviewed today and she is agreeable to continue.  Adjusting her dose may be needed in the future if her platelet count starts to rise. 2. Thrombosis prophylaxis.  She remains on low-dose aspirin for low risk of thrombosis. 3. Constitutional symptoms: Very limited at this time without any need for additional treatment. 4. Age-appropriate cancer screening: She is up-to-date with colonoscopies which is due for the next few months. 5. Hyperkalemia: Potassium back within normal range.  Will be checked periodically. 6. Follow-up: Will be in 2 months.  15  minutes was spent with the patient face-to-face today.  More than 50% of time was dedicated to patient counseling, education and answering questions regarding future plan of care.      Zola Button, MD 5/2/20199:37 AM`

## 2018-01-31 DIAGNOSIS — H524 Presbyopia: Secondary | ICD-10-CM | POA: Diagnosis not present

## 2018-01-31 DIAGNOSIS — H353131 Nonexudative age-related macular degeneration, bilateral, early dry stage: Secondary | ICD-10-CM | POA: Diagnosis not present

## 2018-02-22 ENCOUNTER — Encounter: Payer: Self-pay | Admitting: Gastroenterology

## 2018-03-22 ENCOUNTER — Inpatient Hospital Stay: Payer: Medicare Other

## 2018-03-22 ENCOUNTER — Inpatient Hospital Stay: Payer: Medicare Other | Attending: Oncology | Admitting: Oncology

## 2018-03-22 ENCOUNTER — Telehealth: Payer: Self-pay

## 2018-03-22 VITALS — BP 132/60 | HR 66 | Temp 98.7°F | Resp 17 | Ht 62.0 in | Wt 139.8 lb

## 2018-03-22 DIAGNOSIS — D473 Essential (hemorrhagic) thrombocythemia: Secondary | ICD-10-CM

## 2018-03-22 DIAGNOSIS — Z7982 Long term (current) use of aspirin: Secondary | ICD-10-CM | POA: Diagnosis not present

## 2018-03-22 DIAGNOSIS — C946 Myelodysplastic disease, not classified: Secondary | ICD-10-CM | POA: Diagnosis not present

## 2018-03-22 LAB — CMP (CANCER CENTER ONLY)
ALBUMIN: 4.2 g/dL (ref 3.5–5.0)
ALK PHOS: 73 U/L (ref 38–126)
ALT: 15 U/L (ref 0–44)
AST: 21 U/L (ref 15–41)
Anion gap: 10 (ref 5–15)
BILIRUBIN TOTAL: 0.5 mg/dL (ref 0.3–1.2)
BUN: 12 mg/dL (ref 8–23)
CALCIUM: 9.3 mg/dL (ref 8.9–10.3)
CO2: 24 mmol/L (ref 22–32)
CREATININE: 0.99 mg/dL (ref 0.44–1.00)
Chloride: 107 mmol/L (ref 98–111)
GFR, EST NON AFRICAN AMERICAN: 55 mL/min — AB (ref >60)
GFR, Est AFR Am: >60 mL/min (ref >60)
GLUCOSE: 93 mg/dL (ref 70–99)
Potassium: 4.1 mmol/L (ref 3.5–5.1)
Sodium: 141 mmol/L (ref 135–145)
TOTAL PROTEIN: 6.8 g/dL (ref 6.5–8.1)

## 2018-03-22 LAB — CBC WITH DIFFERENTIAL (CANCER CENTER ONLY)
BASOS ABS: 0 10*3/uL (ref 0.0–0.1)
BASOS PCT: 1 %
Eosinophils Absolute: 0.2 10*3/uL (ref 0.0–0.5)
Eosinophils Relative: 2 %
HEMATOCRIT: 41.3 % (ref 34.8–46.6)
Hemoglobin: 14.2 g/dL (ref 11.6–15.9)
LYMPHS PCT: 24 %
Lymphs Abs: 1.5 10*3/uL (ref 0.9–3.3)
MCH: 38.7 pg — ABNORMAL HIGH (ref 25.1–34.0)
MCHC: 34.4 g/dL (ref 31.5–36.0)
MCV: 112.4 fL — AB (ref 79.5–101.0)
MONO ABS: 0.6 10*3/uL (ref 0.1–0.9)
MONOS PCT: 10 %
NEUTROS PCT: 63 %
Neutro Abs: 3.9 10*3/uL (ref 1.5–6.5)
Platelet Count: 642 10*3/uL — ABNORMAL HIGH (ref 145–400)
RBC: 3.68 MIL/uL — ABNORMAL LOW (ref 3.70–5.45)
RDW: 15.1 % — AB (ref 11.2–14.5)
WBC Count: 6.2 10*3/uL (ref 3.9–10.3)

## 2018-03-22 NOTE — Telephone Encounter (Signed)
Per 6/26 los patient declined avs and calender.

## 2018-03-22 NOTE — Progress Notes (Signed)
Hematology and Oncology Follow Up Visit  Anna Fuller [id number] 1946-06-09 72 y.o. 03/22/2018 8:21 AM      Principle Diagnosis: 72 year old woman with myeloproliferative disorder diagnosed in 2007.  She presented with JAK2 positive essential thrombocythemia and elevated platelets.   Prior Therapy:   She is status post Anagrelide therapy discontinued due to do intolerance.   Current therapy:  Hydroxyurea since 2009.  Her dose increased to 1500 mg daily starting on 08/06/2016.  Interim History: Ms. Folds is here for a follow-up.  Since last visit, she reports feeling reasonably well without any recent complaints.  She remains active and continues to attend to activities of daily living.  She does report mild fatigue associated with hydroxyurea but no other complaints.  She denies any thrombosis or bleeding episodes.  She denies any constitutional symptoms.  He denies any bone pain or pathological fractures.  She does not report any headaches, blurry vision, syncope or seizures.  She denies any confusion or alteration in mental status.  She denies any fevers, chills or sweats.  Appetite and weight is stable.  Does not report any cough, wheezing or hemoptysis.  Does not report any chest pain, palpitation, orthopnea or leg edema.  Does not report any nausea, vomiting or abdominal pain.  Does not report any constipation or diarrhea.   Does not report frequency, urgency or hematuria.  Does not report any skin rashes or lesions. Does not report any lymphadenopathy or petechiae.  She denies any mood issues.  Remaining review of systems is negative.     Medications: I have reviewed the patient's current medications. Current Outpatient Prescriptions  Medication Sig Dispense Refill  . aspirin 81 MG tablet Take 81 mg by mouth daily.        . cholecalciferol (VITAMIN D) 1000 UNITS tablet Take 2,000 Units by mouth daily.      Marland Kitchen estrogens, conjugated, (PREMARIN) 0.3 MG tablet Take 1 tablet (0.3  mg total) by mouth daily. Take daily for 21 days then do not take for 7 days.  30 tablet  11  . hydroxyurea (HYDREA) 500 MG capsule Take one tab twice a day.  Every other night take an additional tablet at night.  (Alternate 1000mg  and 1500mg ).  120 capsule  3  . simvastatin (ZOCOR) 40 MG tablet Take 1 tablet (40 mg total) by mouth at bedtime.  30 tablet  11       Allergies: Reviewed with the patient again today. Allergies  Allergen Reactions  . Codeine Nausea And Vomiting  . Iodine Anaphylaxis    Contrast dye  . Penicillins Other (See Comments)     Physical Exam: Blood pressure 132/60, pulse 66, temperature 98.7 F (37.1 C), temperature source Oral, resp. rate 17, height 5\' 2"  (1.575 m), weight 139 lb 12.8 oz (63.4 kg), SpO2 99 %.   ECOG: 1 General appearance: Well-appearing woman without distress. Head: Normocephalic without abnormalities. Eyes: Sclera anicteric.  Pupils are equal and round reactive to light. Oropharynx: No thrush or ulcers.   Lymph nodes: No cervical, supraclavicular, axillary or inguinal lymphadenopathy.  Heart: Regular rate and rhythm without any murmurs or gallops. Lung: Clear without any rhonchi, wheezes or dullness to percussion. Abdomin: Soft, without any rebound or guarding.  No shifting dullness or ascites. Musculoskeletal: No joint deformity or effusion. Neurological examination: Intact examination motor, sensory and deep tendon reflexes. Skin: Moist without any rashes or lesions.  Lab Results: Lab Results  Component Value Date   WBC 6.2 03/22/2018  HGB 14.2 03/22/2018   HCT 41.3 03/22/2018   MCV 112.4 (H) 03/22/2018   PLT 642 (H) 03/22/2018     Chemistry      Component Value Date/Time   NA 140 01/26/2018 0850   NA 140 09/28/2017 0811   K 4.2 01/26/2018 0850   K 3.8 09/28/2017 0811   CL 107 01/26/2018 0850   CL 105 01/30/2013 0825   CO2 24 01/26/2018 0850   CO2 25 09/28/2017 0811   BUN 15 01/26/2018 0850   BUN 14.6 09/28/2017 0811    CREATININE 1.00 01/26/2018 0850   CREATININE 0.9 09/28/2017 0811      Component Value Date/Time   CALCIUM 9.3 01/26/2018 0850   CALCIUM 9.1 09/28/2017 0811   ALKPHOS 66 01/26/2018 0850   ALKPHOS 75 09/28/2017 0811   AST 22 01/26/2018 0850   AST 23 09/28/2017 0811   ALT 13 01/26/2018 0850   ALT 23 09/28/2017 0811   BILITOT 0.5 01/26/2018 0850   BILITOT 0.46 09/28/2017 0811      Impression and Plan:   72 year old woman with:   1. JAK-2 positive myeloproliferative disorder: She presented with elevated platelet count in 2007.  She remained on hydroxyurea without any major complications.  Her platelet count slightly elevated which has fluctuated in the past.  Risks and benefits of continuing the same dose and schedule was reviewed today and we have elected to continue at this time.  Dose modifications may be needed in the future if her platelets continue to elevate. 2. Thrombosis prophylaxis.  No recent thrombosis noted at this time.  She remains on aspirin. 3. Constitutional symptoms: None reported at this time.  We will continue to monitor this aspect of her disease. 4. Age-appropriate cancer screening: She has a colonoscopy scheduled in the near future or follow-up on history of polyps. 5. Follow-up: Will be in 2 months.  15  minutes was spent with the patient face-to-face today.  More than 50% of time was dedicated to patient counseling, education and discussing the natural course of this disease as well as indication to change her treatment regimen.     Zola Button, MD 6/26/20198:21 AM`

## 2018-04-18 ENCOUNTER — Encounter: Payer: Self-pay | Admitting: Oncology

## 2018-04-19 ENCOUNTER — Ambulatory Visit (AMBULATORY_SURGERY_CENTER): Payer: Self-pay | Admitting: *Deleted

## 2018-04-19 VITALS — Ht 62.5 in | Wt 141.0 lb

## 2018-04-19 DIAGNOSIS — Z86010 Personal history of colonic polyps: Secondary | ICD-10-CM

## 2018-04-19 NOTE — Progress Notes (Signed)
No egg or soy allergy known to patient  No issues with past sedation with any surgeries  or procedures, no intubation problems  No diet pills per patient No home 02 use per patient  No blood thinners per patient  Pt denies issues with constipation  No A fib or A flutter  EMMI video sent to pt's e mail pt declined   

## 2018-05-03 ENCOUNTER — Ambulatory Visit (AMBULATORY_SURGERY_CENTER): Payer: Medicare Other | Admitting: Gastroenterology

## 2018-05-03 ENCOUNTER — Encounter: Payer: Self-pay | Admitting: Gastroenterology

## 2018-05-03 VITALS — BP 118/60 | HR 64 | Temp 97.8°F | Resp 18 | Ht 62.5 in | Wt 141.0 lb

## 2018-05-03 DIAGNOSIS — D122 Benign neoplasm of ascending colon: Secondary | ICD-10-CM | POA: Diagnosis not present

## 2018-05-03 DIAGNOSIS — Z86010 Personal history of colonic polyps: Secondary | ICD-10-CM

## 2018-05-03 DIAGNOSIS — D125 Benign neoplasm of sigmoid colon: Secondary | ICD-10-CM | POA: Diagnosis not present

## 2018-05-03 MED ORDER — SODIUM CHLORIDE 0.9 % IV SOLN: 500.0000 mL | Freq: Once | INTRAVENOUS | Status: DC

## 2018-05-03 NOTE — Progress Notes (Signed)
Report to PACU, RN, vss, BBS= Clear.  

## 2018-05-03 NOTE — Progress Notes (Signed)
Called to room to assist during endoscopic procedure.  Patient ID and intended procedure confirmed with present staff. Received instructions for my participation in the procedure from the performing physician.  

## 2018-05-03 NOTE — Progress Notes (Signed)
Pt's states no medical or surgical changes since previsit or office visit. 

## 2018-05-03 NOTE — Patient Instructions (Signed)
   INFORMATION ON POLYPS AND DIVERTICULOSIS GIVEN TO YOU TODAY  AWAIT PATHOLOGY RESULTS ON POLYPS REMOVED    YOU HAD AN ENDOSCOPIC PROCEDURE TODAY AT THE [facility]:   Refer to the procedure report that was given to you for any specific questions about what was found during the examination.  If the procedure report does not answer your questions, please call your gastroenterologist to clarify.  If you requested that your care partner not be given the details of your procedure findings, then the procedure report has been included in a sealed envelope for you to review at your convenience later.  YOU SHOULD EXPECT: Some feelings of bloating in the abdomen. Passage of more gas than usual.  Walking can help get rid of the air that was put into your GI tract during the procedure and reduce the bloating. If you had a lower endoscopy (such as a colonoscopy or flexible sigmoidoscopy) you may notice spotting of blood in your stool or on the toilet paper. If you underwent a bowel prep for your procedure, you may not have a normal bowel movement for a few days.  Please Note:  You might notice some irritation and congestion in your nose or some drainage.  This is from the oxygen used during your procedure.  There is no need for concern and it should clear up in a day or so.  SYMPTOMS TO REPORT IMMEDIATELY:   Following lower endoscopy (colonoscopy or flexible sigmoidoscopy):  Excessive amounts of blood in the stool  Significant tenderness or worsening of abdominal pains  Swelling of the abdomen that is new, acute  Fever of 100F or higher   For urgent or emergent issues, a gastroenterologist can be reached at any hour by calling [phone].   DIET:  We do recommend a small meal at first, but then you may proceed to your regular diet.  Drink plenty of fluids but you should avoid alcoholic beverages for 24 hours.  ACTIVITY:  You should plan to take it easy for the rest of today and you  should NOT DRIVE or use heavy machinery until tomorrow (because of the sedation medicines used during the test).    FOLLOW UP: Our staff will call the number listed on your records the next business day following your procedure to check on you and address any questions or concerns that you may have regarding the information given to you following your procedure. If we do not reach you, we will leave a message.  However, if you are feeling well and you are not experiencing any problems, there is no need to return our call.  We will assume that you have returned to your regular daily activities without incident.  If any biopsies were taken you will be contacted by phone or by letter within the next 1-3 weeks.  Please call us at [phone] if you have not heard about the biopsies in 3 weeks.    SIGNATURES/CONFIDENTIALITY: You and/or your care partner have signed paperwork which will be entered into your electronic medical record.  These signatures attest to the fact that that the information above on your After Visit Summary has been reviewed and is understood.  Full responsibility of the confidentiality of this discharge information lies with you and/or your care-partner. 

## 2018-05-03 NOTE — Op Note (Signed)
[place] Patient Name: Anna Fuller Procedure Date: 05/03/2018 10:56 AM MRN: [id number] Endoscopist: Remo Lipps P. Havery Moros , MD Age: 72 Referring MD:  Date of Birth: [birth year] Gender: Female Account #: [id number] Procedure:                Colonoscopy Indications:              Surveillance: Personal history of adenomatous                            polyps (multiple small) on last colonoscopy 3 years                            ago Medicines:                Monitored Anesthesia Care Procedure:                Pre-Anesthesia Assessment:                           - Prior to the procedure, a History and Physical                            was performed, and patient medications and                            allergies were reviewed. The patient's tolerance of                            previous anesthesia was also reviewed. The risks                            and benefits of the procedure and the sedation                            options and risks were discussed with the patient.                            All questions were answered, and informed consent                            was obtained. Prior Anticoagulants: The patient has                            taken no previous anticoagulant or antiplatelet                            agents. ASA Grade Assessment: II - A patient with                            mild systemic disease. After reviewing the risks                            and benefits, the patient was deemed in  satisfactory condition to undergo the procedure.                           After obtaining informed consent, the colonoscope                            was passed under direct vision. Throughout the                            procedure, the patient's blood pressure, pulse, and                            oxygen saturations were monitored continuously. The                            Model PCF-[id number]) scope was introduced                             through the anus and advanced to the the cecum,                            identified by appendiceal orifice and ileocecal                            valve. The colonoscopy was performed without                            difficulty. The patient tolerated the procedure                            well. The quality of the bowel preparation was                            good. The ileocecal valve, appendiceal orifice, and                            rectum were photographed. Scope In: 11:03:24 AM Scope Out: 11:23:43 AM Scope Withdrawal Time: 0 hours 15 minutes 18 seconds  Total Procedure Duration: 0 hours 20 minutes 19 seconds  Findings:                 The perianal and digital rectal examinations were                            normal.                           A 3 mm polyp was found in the ascending colon. The                            polyp was sessile. The polyp was removed with a                            cold snare. Resection and retrieval were complete.  A 4 mm polyp was found in the sigmoid colon. The                            polyp was sessile. The polyp was removed with a                            cold snare. Resection and retrieval were complete.                           Scattered medium-mouthed diverticula were found in                            the left colon.                           The exam was otherwise without abnormality. Complications:            No immediate complications. Estimated blood loss:                            Minimal. Estimated Blood Loss:     Estimated blood loss was minimal. Impression:               - One 3 mm polyp in the ascending colon, removed                            with a cold snare. Resected and retrieved.                           - One 4 mm polyp in the sigmoid colon, removed with                            a cold snare. Resected and retrieved.                           -  Diverticulosis in the left colon.                           - The examination was otherwise normal. Recommendation:           - Patient has a contact number available for                            emergencies. The signs and symptoms of potential                            delayed complications were discussed with the                            patient. Return to normal activities tomorrow.                            Written discharge instructions were provided to the  patient.                           - Resume previous diet.                           - Continue present medications.                           - Await pathology results.                           - Repeat colonoscopy in 5 years for surveillance                            given history of multiples adenoma 3 years ago [place]. Armbruster, MD 05/03/2018 11:27:44 AM This report has been signed electronically.

## 2018-05-04 ENCOUNTER — Telehealth: Payer: Self-pay

## 2018-05-04 NOTE — Telephone Encounter (Signed)
  Follow up Call-  Call back number 05/03/2018  Post procedure Call Back phone  # [phone]  Permission to leave phone message Yes  Some recent data might be hidden     Patient questions:  Do you have a fever, pain , or abdominal swelling? No. Pain Score  0 *  Have you tolerated food without any problems? Yes.    Have you been able to return to your normal activities? Yes.    Do you have any questions about your discharge instructions: Diet   No. Medications  No. Follow up visit  No.  Do you have questions or concerns about your Care? No.  Actions: * If pain score is 4 or above: No action needed, pain <4.

## 2018-05-25 ENCOUNTER — Inpatient Hospital Stay: Payer: Medicare Other | Attending: Oncology | Admitting: Oncology

## 2018-05-25 ENCOUNTER — Inpatient Hospital Stay: Payer: Medicare Other

## 2018-05-25 VITALS — BP 131/57 | HR 74 | Temp 98.2°F | Resp 17 | Ht 62.5 in | Wt 140.8 lb

## 2018-05-25 DIAGNOSIS — Z79899 Other long term (current) drug therapy: Secondary | ICD-10-CM | POA: Diagnosis not present

## 2018-05-25 DIAGNOSIS — Z7982 Long term (current) use of aspirin: Secondary | ICD-10-CM | POA: Diagnosis not present

## 2018-05-25 DIAGNOSIS — D473 Essential (hemorrhagic) thrombocythemia: Secondary | ICD-10-CM | POA: Insufficient documentation

## 2018-05-25 DIAGNOSIS — G4700 Insomnia, unspecified: Secondary | ICD-10-CM | POA: Diagnosis not present

## 2018-05-25 DIAGNOSIS — C946 Myelodysplastic disease, not classified: Secondary | ICD-10-CM | POA: Insufficient documentation

## 2018-05-25 LAB — CMP (CANCER CENTER ONLY)
ALK PHOS: 84 U/L (ref 38–126)
ALT: 13 U/L (ref 0–44)
AST: 21 U/L (ref 15–41)
Albumin: 4.1 g/dL (ref 3.5–5.0)
Anion gap: 9 (ref 5–15)
BUN: 10 mg/dL (ref 8–23)
CALCIUM: 9.4 mg/dL (ref 8.9–10.3)
CHLORIDE: 106 mmol/L (ref 98–111)
CO2: 26 mmol/L (ref 22–32)
CREATININE: 0.97 mg/dL (ref 0.44–1.00)
GFR, EST NON AFRICAN AMERICAN: 57 mL/min — AB (ref >60)
Glucose, Bld: 91 mg/dL (ref 70–99)
Potassium: 4.2 mmol/L (ref 3.5–5.1)
SODIUM: 141 mmol/L (ref 135–145)
Total Bilirubin: 0.5 mg/dL (ref 0.3–1.2)
Total Protein: 6.9 g/dL (ref 6.5–8.1)

## 2018-05-25 LAB — CBC WITH DIFFERENTIAL (CANCER CENTER ONLY)
BASOS ABS: 0.1 10*3/uL (ref 0.0–0.1)
Basophils Relative: 1 %
EOS PCT: 2 %
Eosinophils Absolute: 0.1 10*3/uL (ref 0.0–0.5)
HCT: 43.9 % (ref 34.8–46.6)
Hemoglobin: 14.7 g/dL (ref 11.6–15.9)
LYMPHS ABS: 1.9 10*3/uL (ref 0.9–3.3)
LYMPHS PCT: 28 %
MCH: 38 pg — AB (ref 25.1–34.0)
MCHC: 33.4 g/dL (ref 31.5–36.0)
MCV: 113.7 fL — AB (ref 79.5–101.0)
Monocytes Absolute: 0.6 10*3/uL (ref 0.1–0.9)
Monocytes Relative: 9 %
NEUTROS PCT: 60 %
Neutro Abs: 4 10*3/uL (ref 1.5–6.5)
PLATELETS: 643 10*3/uL — AB (ref 145–400)
RBC: 3.86 MIL/uL (ref 3.70–5.45)
RDW: 14.5 % (ref 11.2–14.5)
WBC: 6.6 10*3/uL (ref 3.9–10.3)

## 2018-05-25 NOTE — Progress Notes (Signed)
Hematology and Oncology Follow Up Visit  Anna Fuller [id number] 11-Oct-1945 72 y.o. 05/25/2018 7:53 AM      Principle Diagnosis: 72 year old woman with JAK2 positive essential thrombocythemia diagnosed in 2007.  She presented with and elevated platelets at that time.    Prior Therapy:   She is status post Anagrelide therapy discontinued due to do intolerance.   Current therapy:   Hydroxyurea since 2009.  Her dose increased to 1500 mg daily starting on 08/06/2016.  Interim History: Anna Fuller presents today for a follow-up visit.  Since the last visit, she reports no major changes in her health.  She does report to difficulty with sleep with poor overall hygiene.  He reports that she falls asleep easily but wakes up multiple times a day.  She has tried melatonin in the past which has not been helpful.  She denies any recent hospitalizations or illnesses.  She did have a colonoscopy that was well-tolerated.  She denies any bleeding or clotting tendencies.  She denies any constitutional symptoms.  She does not report any headaches, blurry vision, syncope or seizures.  She denies any dizziness. She denies any fevers, chills or sweats.  Does not report any cough, wheezing or hemoptysis.  Does not report any chest pain, palpitation, orthopnea or leg edema.  Does not report any nausea, vomiting or abdominal pain.  Does not report any changes in bowel habits.    She denies any hematochezia or melena.  Does not report frequency, urgency or hematuria.  Does not report any skin rashes or lesions. Does not report any lymphadenopathy or petechiae.  She denies any anxiety or depression.  Remaining review of systems is negative.     Medications: I have reviewed the patient's current medications. Current Outpatient Prescriptions  Medication Sig Dispense Refill  . aspirin 81 MG tablet Take 81 mg by mouth daily.        . cholecalciferol (VITAMIN D) 1000 UNITS tablet Take 2,000 Units by mouth daily.       Marland Kitchen estrogens, conjugated, (PREMARIN) 0.3 MG tablet Take 1 tablet (0.3 mg total) by mouth daily. Take daily for 21 days then do not take for 7 days.  30 tablet  11  . hydroxyurea (HYDREA) 500 MG capsule Take one tab twice a day.  Every other night take an additional tablet at night.  (Alternate 1000mg  and 1500mg ).  120 capsule  3  . simvastatin (ZOCOR) 40 MG tablet Take 1 tablet (40 mg total) by mouth at bedtime.  30 tablet  11       Allergies: Reviewed with the patient again today. Allergies  Allergen Reactions  . Codeine Nausea And Vomiting  . Iodine Anaphylaxis    Contrast dye  . Penicillins Other (See Comments)     Physical Exam:  Blood pressure (!) 131/57, pulse 74, temperature 98.2 F (36.8 C), temperature source Oral, resp. rate 17, height 5' 2.5" (1.588 m), weight 140 lb 12.8 oz (63.9 kg), SpO2 98 %.   ECOG: 1   General appearance: Comfortable appearing without any discomfort Head: Normocephalic without any trauma Oropharynx: Mucous membranes are moist and pink without any thrush or ulcers. Eyes: Pupils are equal and round reactive to light. Lymph nodes: No cervical, supraclavicular, inguinal or axillary lymphadenopathy.   Heart:regular rate and rhythm.  S1 and S2 without leg edema. Lung: Clear without any rhonchi or wheezes.  No dullness to percussion. Abdomin: Soft, nontender, nondistended with good bowel sounds.  No hepatosplenomegaly. Musculoskeletal: No joint deformity or  effusion.  Full range of motion noted. Neurological: No deficits noted on motor, sensory and deep tendon reflex exam. Skin: No petechial rash or dryness.  Appeared moist.  Psychiatric: Mood and affect appeared appropriate.    Lab Results: Lab Results  Component Value Date   WBC 6.2 03/22/2018   HGB 14.2 03/22/2018   HCT 41.3 03/22/2018   MCV 112.4 (H) 03/22/2018   PLT 642 (H) 03/22/2018     Chemistry      Component Value Date/Time   NA 141 03/22/2018 0737   NA 140 09/28/2017 0811    K 4.1 03/22/2018 0737   K 3.8 09/28/2017 0811   CL 107 03/22/2018 0737   CL 105 01/30/2013 0825   CO2 24 03/22/2018 0737   CO2 25 09/28/2017 0811   BUN 12 03/22/2018 0737   BUN 14.6 09/28/2017 0811   CREATININE 0.99 03/22/2018 0737   CREATININE 0.9 09/28/2017 0811      Component Value Date/Time   CALCIUM 9.3 03/22/2018 0737   CALCIUM 9.1 09/28/2017 0811   ALKPHOS 73 03/22/2018 0737   ALKPHOS 75 09/28/2017 0811   AST 21 03/22/2018 0737   AST 23 09/28/2017 0811   ALT 15 03/22/2018 0737   ALT 23 09/28/2017 0811   BILITOT 0.5 03/22/2018 0737   BILITOT 0.46 09/28/2017 0811      Impression and Plan:  72 year old woman with the following issues:  1.  Essential thrombocythemia diagnosed in 2007.  She presented with elevated platelets and found to have Jak 2+ myeloproliferative disorder.  She continues to be on hydroxyurea at 1500 mg daily to keep her platelet count below 800.  Her platelet count today is unchanged from 2 months ago around 643.  Risks and benefits of continuing this medication long-term as well as possibility of adjusting the dose was reviewed today.  Long-term complication associated with this medicine was also discussed.  He is agreeable to continue at this time.   2.  Thrombosis prophylaxis: Risk of thrombosis is low and she remains on aspirin.  3.  Constitutional symptoms: Very limited noted since last visit.  She does not require any other intervention at this time.  4.  Age-appropriate cancer screening: She is status post colonoscopy that was completed in August 2019.  She did have polyps and they recommended colonoscopy in 5 years.  5.  Insomnia: We discussed strategies to improve her sleep hygiene today.  Sleeping aids were also reviewed and she declined using one at this time.  5.  Follow-up: We will be in 2 months.  15  minutes was spent with the patient face-to-face today.  More than 50% of time was dedicated to discussing the natural course of this  disease, future treatment options as well as alternative to current treatment.     Zola Button, MD 8/29/20197:53 AM`

## 2018-05-30 ENCOUNTER — Other Ambulatory Visit: Payer: Self-pay | Admitting: Internal Medicine

## 2018-05-30 DIAGNOSIS — Z1231 Encounter for screening mammogram for malignant neoplasm of breast: Secondary | ICD-10-CM

## 2018-06-01 ENCOUNTER — Ambulatory Visit (INDEPENDENT_AMBULATORY_CARE_PROVIDER_SITE_OTHER): Payer: Medicare Other | Admitting: Internal Medicine

## 2018-06-01 DIAGNOSIS — Z23 Encounter for immunization: Secondary | ICD-10-CM

## 2018-06-01 NOTE — Progress Notes (Signed)
Flu vaccine given by CMA 

## 2018-06-01 NOTE — Patient Instructions (Signed)
Patient received a flu shot IM on the left deltoid.

## 2018-07-26 ENCOUNTER — Ambulatory Visit
Admission: RE | Admit: 2018-07-26 | Discharge: 2018-07-26 | Disposition: A | Discharge status: Home / Self Care | Payer: Medicare Other | Source: Ambulatory Visit | Attending: Internal Medicine | Admitting: Internal Medicine

## 2018-07-26 DIAGNOSIS — Z1231 Encounter for screening mammogram for malignant neoplasm of breast: Secondary | ICD-10-CM | POA: Diagnosis not present

## 2018-08-01 ENCOUNTER — Telehealth: Payer: Self-pay | Admitting: Oncology

## 2018-08-01 ENCOUNTER — Inpatient Hospital Stay: Payer: Medicare Other | Attending: Oncology | Admitting: Oncology

## 2018-08-01 ENCOUNTER — Inpatient Hospital Stay: Payer: Medicare Other

## 2018-08-01 VITALS — BP 112/60 | HR 79 | Temp 98.5°F | Resp 18 | Ht 62.5 in | Wt 137.5 lb

## 2018-08-01 DIAGNOSIS — Z7982 Long term (current) use of aspirin: Secondary | ICD-10-CM | POA: Insufficient documentation

## 2018-08-01 DIAGNOSIS — D473 Essential (hemorrhagic) thrombocythemia: Secondary | ICD-10-CM | POA: Diagnosis not present

## 2018-08-01 DIAGNOSIS — Z79899 Other long term (current) drug therapy: Secondary | ICD-10-CM | POA: Insufficient documentation

## 2018-08-01 DIAGNOSIS — C946 Myelodysplastic disease, not classified: Secondary | ICD-10-CM | POA: Insufficient documentation

## 2018-08-01 LAB — CBC WITH DIFFERENTIAL (CANCER CENTER ONLY)
Abs Immature Granulocytes: 0.02 10*3/uL (ref 0.00–0.07)
BASOS ABS: 0.1 10*3/uL (ref 0.0–0.1)
Basophils Relative: 1 %
EOS ABS: 0.1 10*3/uL (ref 0.0–0.5)
Eosinophils Relative: 1 %
HEMATOCRIT: 43.3 % (ref 36.0–46.0)
HEMOGLOBIN: 14.3 g/dL (ref 12.0–15.0)
Immature Granulocytes: 0 %
LYMPHS ABS: 1.7 10*3/uL (ref 0.7–4.0)
LYMPHS PCT: 27 %
MCH: 37.8 pg — ABNORMAL HIGH (ref 26.0–34.0)
MCHC: 33 g/dL (ref 30.0–36.0)
MCV: 114.6 fL — AB (ref 80.0–100.0)
MONOS PCT: 9 %
Monocytes Absolute: 0.5 10*3/uL (ref 0.1–1.0)
NRBC: 0 % (ref 0.0–0.2)
Neutro Abs: 4 10*3/uL (ref 1.7–7.7)
Neutrophils Relative %: 62 %
Platelet Count: 614 10*3/uL — ABNORMAL HIGH (ref 150–400)
RBC: 3.78 MIL/uL — ABNORMAL LOW (ref 3.87–5.11)
RDW: 14.8 % (ref 11.5–15.5)
WBC Count: 6.3 10*3/uL (ref 4.0–10.5)

## 2018-08-01 LAB — CMP (CANCER CENTER ONLY)
ALBUMIN: 4.1 g/dL (ref 3.5–5.0)
ALK PHOS: 73 U/L (ref 38–126)
ALT: 15 U/L (ref 0–44)
AST: 24 U/L (ref 15–41)
Anion gap: 9 (ref 5–15)
BUN: 16 mg/dL (ref 8–23)
CALCIUM: 9.4 mg/dL (ref 8.9–10.3)
CHLORIDE: 105 mmol/L (ref 98–111)
CO2: 25 mmol/L (ref 22–32)
CREATININE: 1.11 mg/dL — AB (ref 0.44–1.00)
GFR, EST AFRICAN AMERICAN: 56 mL/min — AB (ref >60)
GFR, Estimated: 48 mL/min — ABNORMAL LOW (ref >60)
GLUCOSE: 94 mg/dL (ref 70–99)
Potassium: 4.6 mmol/L (ref 3.5–5.1)
SODIUM: 139 mmol/L (ref 135–145)
Total Bilirubin: 0.4 mg/dL (ref 0.3–1.2)
Total Protein: 6.9 g/dL (ref 6.5–8.1)

## 2018-08-01 NOTE — Progress Notes (Signed)
Hematology and Oncology Follow Up Visit  Anna Fuller [id number] November 30, 1945 72 y.o. 08/01/2018 7:58 AM      Principle Diagnosis: 72 year old woman with myeloproliferative disorder that is JAK2 positive diagnosed in 2007.  She presented with and elevated platelets and thrombocythemia.     Prior Therapy:   She is status post Anagrelide therapy discontinued due to do intolerance.   Current therapy:   Hydroxyurea since 2009.  Her dose increased to 1500 mg daily starting on 08/06/2016.  Interim History: Anna Fuller returns today for a repeat evaluation.  Since her last visit, she reports no major changes in her health.  She continues to take hydroxyurea without any new complications.  She does report some minor arthralgias which are manageable with Advil.  He denies any constitutional symptoms or weight loss.  Her performance status and activity level remains unchanged.  She denied any recent hospitalizations or illnesses.  She does not report any headaches, blurry vision, syncope or seizures.  She denies any alteration mental status or confusion. She denies any fevers, chills or sweats.  Does not report any cough, wheezing or hemoptysis.  Does not report any chest pain, palpitation, orthopnea or leg edema.  Does not report any nausea, vomiting or abdominal pain.  Does not report any constipation or diarrhea.    She denies any bleeding or clotting tendency.  Does not report frequency, urgency or hematuria.  Does not report any skin rashes or lesions.  No changes in her mood.  Remaining review of systems is negative.     Medications: I have reviewed the patient's current medications. Current Outpatient Prescriptions  Medication Sig Dispense Refill  . aspirin 81 MG tablet Take 81 mg by mouth daily.        . cholecalciferol (VITAMIN D) 1000 UNITS tablet Take 2,000 Units by mouth daily.      Marland Kitchen estrogens, conjugated, (PREMARIN) 0.3 MG tablet Take 1 tablet (0.3 mg total) by mouth daily. Take  daily for 21 days then do not take for 7 days.  30 tablet  11  . hydroxyurea (HYDREA) 500 MG capsule Take one tab twice a day.  Every other night take an additional tablet at night.  (Alternate 1000mg  and 1500mg ).  120 capsule  3  . simvastatin (ZOCOR) 40 MG tablet Take 1 tablet (40 mg total) by mouth at bedtime.  30 tablet  11       Allergies: Reviewed with the patient again today. Allergies  Allergen Reactions  . Codeine Nausea And Vomiting  . Iodine Anaphylaxis    Contrast dye  . Penicillins Other (See Comments)     Physical Exam:  Blood pressure 112/60, pulse 79, temperature 98.5 F (36.9 C), temperature source Oral, resp. rate 18, height 5' 2.5" (1.588 m), weight 137 lb 8 oz (62.4 kg), SpO2 97 %.    ECOG: 1   General appearance: Alert, awake without any distress. Head: Atraumatic without abnormalities Oropharynx: Without any thrush or ulcers. Eyes: No scleral icterus. Lymph nodes: No lymphadenopathy noted in the cervical, supraclavicular, or axillary nodes Heart:regular rate and rhythm, without any murmurs or gallops.   Lung: Clear to auscultation without any rhonchi, wheezes or dullness to percussion. Abdomin: Soft, nontender without any shifting dullness or ascites. Musculoskeletal: No clubbing or cyanosis. Neurological: No motor or sensory deficits. Skin: No rashes or lesions. Psychiatric: Mood and affect appeared normal.     Lab Results: Lab Results  Component Value Date   WBC 6.6 05/25/2018   HGB 14.7  05/25/2018   HCT 43.9 05/25/2018   MCV 113.7 (H) 05/25/2018   PLT 643 (H) 05/25/2018     Chemistry      Component Value Date/Time   NA 141 05/25/2018 0732   NA 140 09/28/2017 0811   K 4.2 05/25/2018 0732   K 3.8 09/28/2017 0811   CL 106 05/25/2018 0732   CL 105 01/30/2013 0825   CO2 26 05/25/2018 0732   CO2 25 09/28/2017 0811   BUN 10 05/25/2018 0732   BUN 14.6 09/28/2017 0811   CREATININE 0.97 05/25/2018 0732   CREATININE 0.9 09/28/2017 0811       Component Value Date/Time   CALCIUM 9.4 05/25/2018 0732   CALCIUM 9.1 09/28/2017 0811   ALKPHOS 84 05/25/2018 0732   ALKPHOS 75 09/28/2017 0811   AST 21 05/25/2018 0732   AST 23 09/28/2017 0811   ALT 13 05/25/2018 0732   ALT 23 09/28/2017 0811   BILITOT 0.5 05/25/2018 0732   BILITOT 0.46 09/28/2017 0811      Impression and Plan:  72 year old woman with the following issues:  1.  Myeloproliferative disorder presented with essential thrombocythemia in 2007.  She was found to have Jak 2+ mutation.  She remains on hydroxyurea without any major complications.  She does report a few side effects that are manageable.  Her laboratory testing was reviewed from today and platelet count remains relatively stable.  Risks and benefits of continuing this medication long-term was reviewed and she is agreeable to continue.   2.  Thrombosis prophylaxis: No recent thrombosis or bleeding episodes noted.  She remains on aspirin.  3.  Constitutional symptoms: She does report mild arthralgias that are manageable with Advil.  I recommended alternating with Tylenol if the pain gets worse.  4.  Age-appropriate cancer screening: She continues to be up-to-date at this time.  She has completed a colonoscopy and mammogram this year.  5.  Follow-up: We will be in 2 months.  15  minutes was spent with the patient face-to-face today.  More than 50% of time was dedicated to reviewing her disease status, laboratory testing and options of therapy.     Anna Button, MD 11/5/20197:58 AM`

## 2018-08-01 NOTE — Telephone Encounter (Signed)
Appts scheduled avs printed/calendar declined per 11/5 los

## 2018-08-04 ENCOUNTER — Other Ambulatory Visit: Payer: Self-pay | Admitting: Oncology

## 2018-08-04 DIAGNOSIS — D473 Essential (hemorrhagic) thrombocythemia: Secondary | ICD-10-CM

## 2018-08-11 ENCOUNTER — Other Ambulatory Visit: Payer: Self-pay

## 2018-09-11 ENCOUNTER — Other Ambulatory Visit: Payer: Self-pay | Admitting: Internal Medicine

## 2018-09-28 ENCOUNTER — Inpatient Hospital Stay (HOSPITAL_BASED_OUTPATIENT_CLINIC_OR_DEPARTMENT_OTHER): Payer: Medicare Other | Admitting: Oncology

## 2018-09-28 ENCOUNTER — Telehealth: Payer: Self-pay | Admitting: Oncology

## 2018-09-28 ENCOUNTER — Inpatient Hospital Stay: Payer: Medicare Other | Attending: Oncology

## 2018-09-28 VITALS — BP 139/51 | HR 74 | Temp 97.9°F | Resp 17 | Ht 62.5 in | Wt 137.9 lb

## 2018-09-28 DIAGNOSIS — Z7982 Long term (current) use of aspirin: Secondary | ICD-10-CM | POA: Diagnosis not present

## 2018-09-28 DIAGNOSIS — D473 Essential (hemorrhagic) thrombocythemia: Secondary | ICD-10-CM

## 2018-09-28 DIAGNOSIS — R7989 Other specified abnormal findings of blood chemistry: Secondary | ICD-10-CM | POA: Insufficient documentation

## 2018-09-28 DIAGNOSIS — Z79899 Other long term (current) drug therapy: Secondary | ICD-10-CM

## 2018-09-28 LAB — CMP (CANCER CENTER ONLY)
ALK PHOS: 75 U/L (ref 38–126)
ALT: 16 U/L (ref 0–44)
AST: 19 U/L (ref 15–41)
Albumin: 3.8 g/dL (ref 3.5–5.0)
Anion gap: 10 (ref 5–15)
BUN: 11 mg/dL (ref 8–23)
CO2: 24 mmol/L (ref 22–32)
Calcium: 9 mg/dL (ref 8.9–10.3)
Chloride: 107 mmol/L (ref 98–111)
Creatinine: 1.07 mg/dL — ABNORMAL HIGH (ref 0.44–1.00)
GFR, Est AFR Am: 59 mL/min — ABNORMAL LOW (ref >60)
GFR, Estimated: 51 mL/min — ABNORMAL LOW (ref >60)
Glucose, Bld: 90 mg/dL (ref 70–99)
Potassium: 3.6 mmol/L (ref 3.5–5.1)
Sodium: 141 mmol/L (ref 135–145)
Total Bilirubin: 0.5 mg/dL (ref 0.3–1.2)
Total Protein: 6.5 g/dL (ref 6.5–8.1)

## 2018-09-28 LAB — CBC WITH DIFFERENTIAL (CANCER CENTER ONLY)
Abs Immature Granulocytes: 0.03 10*3/uL (ref 0.00–0.07)
Basophils Absolute: 0.1 10*3/uL (ref 0.0–0.1)
Basophils Relative: 1 %
Eosinophils Absolute: 0.1 10*3/uL (ref 0.0–0.5)
Eosinophils Relative: 2 %
HCT: 42.3 % (ref 36.0–46.0)
Hemoglobin: 13.9 g/dL (ref 12.0–15.0)
Immature Granulocytes: 0 %
Lymphocytes Relative: 24 %
Lymphs Abs: 1.8 10*3/uL (ref 0.7–4.0)
MCH: 37.2 pg — AB (ref 26.0–34.0)
MCHC: 32.9 g/dL (ref 30.0–36.0)
MCV: 113.1 fL — ABNORMAL HIGH (ref 80.0–100.0)
MONO ABS: 0.7 10*3/uL (ref 0.1–1.0)
Monocytes Relative: 9 %
Neutro Abs: 4.6 10*3/uL (ref 1.7–7.7)
Neutrophils Relative %: 64 %
Platelet Count: 632 10*3/uL — ABNORMAL HIGH (ref 150–400)
RBC: 3.74 MIL/uL — AB (ref 3.87–5.11)
RDW: 14.6 % (ref 11.5–15.5)
WBC: 7.2 10*3/uL (ref 4.0–10.5)
nRBC: 0 % (ref 0.0–0.2)

## 2018-09-28 NOTE — Telephone Encounter (Signed)
Scheduled appointments per 01/02 los.  Patient declined avs and calendar.

## 2018-09-28 NOTE — Progress Notes (Signed)
Hematology and Oncology Follow Up Visit  Anna Fuller [id number] Feb 29, 1946 73 y.o. 09/28/2018 7:52 AM      Principle Diagnosis: 73 year old woman with essential thrombocythemia diagnosed in 2007.  She presented with thrombocytosis and JAK2 positive mutation.   Prior Therapy:   She is status post Anagrelide therapy discontinued due to do intolerance.   Current therapy:   Hydroxyurea since 2009.  Her dose increased to 1500 mg daily starting on 08/06/2016.  Interim History: Anna Fuller is here for a repeat evaluation.  Since her last visit, she reports no major changes in her health.  She was traveling in [place] and was able to travel and enjoy her trip without any medical concerns.  She did have some periodic nausea at nighttime but no abdominal pain or discomfort.  She denies any hematochezia or melena.  She denies any other complications related to hydroxyurea.  She denies any worsening arthralgias or myalgias.  Continues to be active and attends activities of daily living.  She does not report any headaches, blurry vision, syncope or seizures.  She denies any dizziness or lethargy.  She denies any fevers, chills or sweats.  Does not report any cough, wheezing or hemoptysis.  Does not report any chest pain, palpitation, orthopnea or leg edema.  Does not report any nausea, vomiting or distention. Does not report any changes in bowel habits.  She denies any ecchymosis or easy bruising.  Does not report frequency, urgency or hematuria.  Does not report any heat or cold intolerance..  No anxiety or depression.  Remaining review of systems is negative.     Medications: I have reviewed the patient's current medications. Current Outpatient Prescriptions  Medication Sig Dispense Refill  . aspirin 81 MG tablet Take 81 mg by mouth daily.        . cholecalciferol (VITAMIN D) 1000 UNITS tablet Take 2,000 Units by mouth daily.      Marland Kitchen estrogens, conjugated, (PREMARIN) 0.3 MG tablet Take 1 tablet  (0.3 mg total) by mouth daily. Take daily for 21 days then do not take for 7 days.  30 tablet  11  . hydroxyurea (HYDREA) 500 MG capsule Take one tab twice a day.  Every other night take an additional tablet at night.  (Alternate 1000mg  and 1500mg ).  120 capsule  3  . simvastatin (ZOCOR) 40 MG tablet Take 1 tablet (40 mg total) by mouth at bedtime.  30 tablet  11       Allergies: Reviewed with the patient again today. Allergies  Allergen Reactions  . Codeine Nausea And Vomiting  . Iodine Anaphylaxis    Contrast dye  . Penicillins Other (See Comments)     Physical Exam:  Blood pressure (!) 139/51, pulse 74, temperature 97.9 F (36.6 C), temperature source Oral, resp. rate 17, height 5' 2.5" (1.588 m), weight 137 lb 14.4 oz (62.6 kg), SpO2 99 %.     ECOG: 1   General appearance: Comfortable appearing without any discomfort Head: Normocephalic without any trauma Oropharynx: Mucous membranes are moist and pink without any thrush or ulcers. Eyes: Pupils are equal and round reactive to light. Lymph nodes: No cervical, supraclavicular, inguinal or axillary lymphadenopathy.   Heart:regular rate and rhythm.  S1 and S2 without leg edema. Lung: Clear without any rhonchi or wheezes.  No dullness to percussion. Abdomin: Soft, nontender, nondistended with good bowel sounds.  No hepatosplenomegaly. Musculoskeletal: No joint deformity or effusion.  Full range of motion noted. Neurological: No deficits noted on motor,  sensory and deep tendon reflex exam. Skin: No petechial rash or dryness.  Appeared moist.       Lab Results: Lab Results  Component Value Date   WBC 6.3 08/01/2018   HGB 14.3 08/01/2018   HCT 43.3 08/01/2018   MCV 114.6 (H) 08/01/2018   PLT 614 (H) 08/01/2018     Chemistry      Component Value Date/Time   NA 139 08/01/2018 0752   NA 140 09/28/2017 0811   K 4.6 08/01/2018 0752   K 3.8 09/28/2017 0811   CL 105 08/01/2018 0752   CL 105 01/30/2013 0825   CO2 25  08/01/2018 0752   CO2 25 09/28/2017 0811   BUN 16 08/01/2018 0752   BUN 14.6 09/28/2017 0811   CREATININE 1.11 (H) 08/01/2018 0752   CREATININE 0.9 09/28/2017 0811      Component Value Date/Time   CALCIUM 9.4 08/01/2018 0752   CALCIUM 9.1 09/28/2017 0811   ALKPHOS 73 08/01/2018 0752   ALKPHOS 75 09/28/2017 0811   AST 24 08/01/2018 0752   AST 23 09/28/2017 0811   ALT 15 08/01/2018 0752   ALT 23 09/28/2017 0811   BILITOT 0.4 08/01/2018 0752   BILITOT 0.46 09/28/2017 0811      Impression and Plan:  73 year old woman with the following issues:  1.  Essential thrombocythemia diagnosed in 2007.  He presented with elevated platelets and Jak 2+ mutation.  She is currently on hydroxyurea which she has tolerated reasonably well with few side effects that have been manageable.  Platelet count today remains under control with the current dose of hydroxyurea.  Risks and benefits of increasing the dose were reviewed today and as long as her platelet count less than 800 we will keep her at the same dose.  No recent thrombosis or bleeding episodes.  The natural course of her disease was reviewed again and alternative therapies were discussed but at this time no need to change her treatment.   2.  Thrombosis prophylaxis: I recommended continuing aspirin without any recent thrombosis episodes.  3.  Constitutional symptoms: Manageable at this time with very little intervention.  4.  Age-appropriate cancer screening: She is up-to-date at this time.  5.  Increased creatinine: Her creatinine clearance slightly decreased in November 2019.  This will be rechecked today.  She has follow-up with Dr. Renold Genta next week as well.  6.  Follow-up: We will be in 2 months.  15  minutes was spent with the patient face-to-face today.  More than 50% of time was dedicated to discussing her laboratory data, natural course of her disease and alternative treatment options.     Zola Button, MD 1/2/20207:52 AM`

## 2018-10-02 ENCOUNTER — Other Ambulatory Visit: Payer: Medicare Other | Admitting: Internal Medicine

## 2018-10-02 DIAGNOSIS — E7849 Other hyperlipidemia: Secondary | ICD-10-CM | POA: Diagnosis not present

## 2018-10-02 DIAGNOSIS — D473 Essential (hemorrhagic) thrombocythemia: Secondary | ICD-10-CM | POA: Diagnosis not present

## 2018-10-02 DIAGNOSIS — Z1589 Genetic susceptibility to other disease: Secondary | ICD-10-CM | POA: Diagnosis not present

## 2018-10-02 DIAGNOSIS — Z0000 Encounter for general adult medical examination without abnormal findings: Secondary | ICD-10-CM

## 2018-10-03 LAB — LIPID PANEL
CHOL/HDL RATIO: 2.4 (calc) (ref <5.0)
Cholesterol: 173 mg/dL (ref <200)
HDL: 71 mg/dL (ref >50)
LDL CHOLESTEROL (CALC): 79 mg/dL
Non-HDL Cholesterol (Calc): 102 mg/dL (calc) (ref <130)
Triglycerides: 134 mg/dL (ref <150)

## 2018-10-03 LAB — TSH: TSH: 1.76 mIU/L (ref 0.40–4.50)

## 2018-10-05 ENCOUNTER — Encounter: Payer: Self-pay | Admitting: Internal Medicine

## 2018-10-05 ENCOUNTER — Ambulatory Visit (INDEPENDENT_AMBULATORY_CARE_PROVIDER_SITE_OTHER): Payer: Medicare Other | Admitting: Internal Medicine

## 2018-10-05 VITALS — BP 110/80 | HR 67 | Temp 98.2°F | Ht 62.0 in | Wt 137.0 lb

## 2018-10-05 DIAGNOSIS — Z0000 Encounter for general adult medical examination without abnormal findings: Secondary | ICD-10-CM

## 2018-10-05 DIAGNOSIS — Z1589 Genetic susceptibility to other disease: Secondary | ICD-10-CM

## 2018-10-05 DIAGNOSIS — R110 Nausea: Secondary | ICD-10-CM | POA: Diagnosis not present

## 2018-10-05 DIAGNOSIS — E785 Hyperlipidemia, unspecified: Secondary | ICD-10-CM | POA: Diagnosis not present

## 2018-10-05 DIAGNOSIS — D473 Essential (hemorrhagic) thrombocythemia: Secondary | ICD-10-CM | POA: Diagnosis not present

## 2018-10-05 LAB — POCT URINALYSIS DIPSTICK
Appearance: NEGATIVE
Bilirubin, UA: NEGATIVE
Glucose, UA: NEGATIVE
Ketones, UA: NEGATIVE
Leukocytes, UA: NEGATIVE
NITRITE UA: NEGATIVE
Odor: NEGATIVE
Protein, UA: NEGATIVE
RBC UA: NEGATIVE
Spec Grav, UA: 1.015 (ref 1.010–1.025)
Urobilinogen, UA: 0.2 E.U./dL
pH, UA: 6 (ref 5.0–8.0)

## 2018-10-05 MED ORDER — ONDANSETRON HCL 4 MG PO TABS: 4.0000 mg | ORAL_TABLET | Freq: Three times a day (TID) | ORAL | 99 refills | Status: DC | PRN

## 2018-10-05 NOTE — Progress Notes (Signed)
Subjective:    Patient ID: Anna Fuller, female    DOB: 07-09-1946, 73 y.o.   MRN: [id number] 73 year old Female with essential thrombocytosis Jak 2 myeloproliferative disorder diagnosed in 2007 here for Medicare wellness health maintenance exam and evaluation of medical issues.   HPI  For several months has had platelet count in 600,00 range.Followed by Oncology. Nauseated at night frequently likely related to Hydrea. Prescribed Zofran.  Dr. Alen Blew is watching her closely.  Had colonoscopy and had one adenomatous polyp.  She is allergic to contrast dye, cortisone, penicillin  Past medical history: History of hyperlipidemia treated with simvastatin.  Longstanding history of estrogen replacement and does not want to be taken off of it.  Hospitalized with fever of unknown origin 63, cholecystectomy 1993, hysterectomy with bilateral oophorectomy 1991.  Right wrist surgery 2008.  Foot surgery of both feet 1965, 1968, 1969.  Has had left and right knee surgeries.  Social history: She is single and retired.  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  Completed 2 years of college.  Does not smoke or consume alcohol.  Enjoys walking and doing yard work.  Family history: Her sister, Anna Fuller, is also a patient here and lives in [place].  No other siblings.  Father died at age 99 with complications of diabetes.  Mother died at age 50 of pulmonary fibrosis.  She has had Zostavax vaccine and both pneumococcal vaccines and has annual flu vaccine.  She had tetanus immunization in 2006 but had significant local reaction so that will not be repeated.  Review of Systems  Constitutional: Positive for fatigue.  Respiratory: Negative.   Cardiovascular: Negative.   Gastrointestinal: Negative.   Genitourinary: Negative.   Neurological: Negative.   Psychiatric/Behavioral: Negative.        Objective:   Physical Exam Vitals  signs reviewed.  Constitutional:      General: She is not in acute distress.    Appearance: Normal appearance.  HENT:     Head: Normocephalic and atraumatic.     Right Ear: Tympanic membrane and ear canal normal.     Left Ear: Tympanic membrane and ear canal normal.     Nose: Nose normal.     Mouth/Throat:     Mouth: Mucous membranes are moist.     Pharynx: Oropharynx is clear.  Eyes:     General: No scleral icterus.       Right eye: No discharge.        Left eye: No discharge.     Conjunctiva/sclera: Conjunctivae normal.     Pupils: Pupils are equal, round, and reactive to light.  Cardiovascular:     Rate and Rhythm: Normal rate and regular rhythm.     Heart sounds: No murmur.  Pulmonary:     Effort: Pulmonary effort is normal. No respiratory distress.     Breath sounds: Normal breath sounds.  Abdominal:     General: There is no distension.     Palpations: Abdomen is soft. There is no mass.     Tenderness: There is no abdominal tenderness.  Genitourinary:    Comments: Not performed as pt s/p hysterectomy BSO 1991. Musculoskeletal:     Right lower leg: No edema.     Left lower leg: No edema.  Skin:    General: Skin is warm and dry.  Neurological:     General: No focal deficit present.     Mental Status: She is alert and  oriented to person, place, and time.     Cranial Nerves: No cranial nerve deficit.  Psychiatric:        Mood and Affect: Mood normal.        Behavior: Behavior normal.        Thought Content: Thought content normal.        Judgment: Judgment normal.           Assessment & Plan:  Thrombocytosis with Jak 2+ mutation  Hyperlipidemia treated with statin  Estrogen replacement longstanding and does not want to be off of it  Plan: Continue current medications and return in 1 year or as needed and continue follow-up with Dr. Alen Blew, oncologist  Subjective:   Patient presents for Medicare Annual/Subsequent preventive examination.  Review Past  Medical/Family/Social: See above   Risk Factors  Current exercise habits: Active with house and yard work Dietary issues discussed: Low-fat low carbohydrate  Cardiac risk factors: Hyperlipidemia  Depression Screen  (Note: if answer to either of the following is "Yes", a more complete depression screening is indicated)   Over the past two weeks, have you felt down, depressed or hopeless? No  Over the past two weeks, have you felt little interest or pleasure in doing things? No Have you lost interest or pleasure in daily life? No Do you often feel hopeless? No Do you cry easily over simple problems? No   Activities of Daily Living  In your present state of health, do you have any difficulty performing the following activities?:   Driving? No  Managing money? No  Feeding yourself? No  Getting from bed to chair? No  Climbing a flight of stairs? No  Preparing food and eating?: No  Bathing or showering? No  Getting dressed: No  Getting to the toilet? No  Using the toilet:No  Moving around from place to place: No  In the past year have you fallen or had a near fall?:No  Are you sexually active? No  Do you have more than one partner? No   Hearing Difficulties: No  Do you often ask people to speak up or repeat themselves? No  Do you experience ringing or noises in your ears?  Yes Do you have difficulty understanding soft or whispered voices? No  Do you feel that you have a problem with memory? No Do you often misplace items? No    Home Safety:  Do you have a smoke alarm at your residence? Yes Do you have grab bars in the bathroom?  None Do you have throw rugs in your house?  None   Cognitive Testing  Alert? Yes Normal Appearance?Yes  Oriented to person? Yes Place? Yes  Time? Yes  Recall of three objects? Yes  Can perform simple calculations? Yes  Displays appropriate judgment?Yes  Can read the correct time from a watch face?Yes   List the Names of Other  Physician/Practitioners you currently use:  See referral list for the physicians patient is currently seeing.     Review of Systems:   Objective:     General appearance: Appears younger than stated age Head: Normocephalic, without obvious abnormality, atraumatic  Eyes: conj clear, EOMi PEERLA  Ears: normal TM's and external ear canals both ears  Nose: Nares normal. Septum midline. Mucosa normal. No drainage or sinus tenderness.  Throat: lips, mucosa, and tongue normal; teeth and gums normal  Neck: no adenopathy, no carotid bruit, no JVD, supple, symmetrical, trachea midline and thyroid not enlarged, symmetric, no tenderness/mass/nodules  No CVA  tenderness.  Lungs: clear to auscultation bilaterally  Breasts: normal appearance, no masses or tenderness Heart: regular rate and rhythm, S1, S2 normal, no murmur, click, rub or gallop  Abdomen: soft, non-tender; bowel sounds normal; no masses, no organomegaly  Musculoskeletal: ROM normal in all joints, no crepitus, no deformity, Normal muscle strengthen. Back  is symmetric, no curvature. Skin: Skin color, texture, turgor normal. No rashes or lesions  Lymph nodes: Cervical, supraclavicular, and axillary nodes normal.  Neurologic: CN 2 -12 Normal, Normal symmetric reflexes. Normal coordination and gait  Psych: Alert & Oriented x 3, Mood appear stable.    Assessment:    Annual wellness medicare exam   Plan:    During the course of the visit the patient was educated and counseled about appropriate screening and preventive services including:  Annual mammogram      Patient Instructions (the written plan) was given to the patient.  Medicare Attestation  I have personally reviewed:  The patient's medical and social history  Their use of alcohol, tobacco or illicit drugs  Their current medications and supplements  The patient's functional ability including ADLs,fall risks, home safety risks, cognitive, and hearing and visual  impairment  Diet and physical activities  Evidence for depression or mood disorders  The patient's weight, height, BMI, and visual acuity have been recorded in the chart. I have made referrals, counseling, and provided education to the patient based on review of the above and I have provided the patient with a written personalized care plan for preventive services.

## 2018-10-05 NOTE — Patient Instructions (Signed)
It was a pleasure to see you today. Take Zofran for nausea.  Continue other meds and RTC in one year.

## 2018-10-25 ENCOUNTER — Encounter: Payer: Self-pay | Admitting: Internal Medicine

## 2018-11-28 ENCOUNTER — Telehealth: Payer: Self-pay | Admitting: Oncology

## 2018-11-28 ENCOUNTER — Inpatient Hospital Stay: Payer: Medicare Other | Attending: Oncology

## 2018-11-28 ENCOUNTER — Inpatient Hospital Stay (HOSPITAL_BASED_OUTPATIENT_CLINIC_OR_DEPARTMENT_OTHER): Payer: Medicare Other | Admitting: Oncology

## 2018-11-28 VITALS — BP 124/51 | HR 75 | Temp 98.3°F | Resp 17 | Ht 62.0 in | Wt 138.5 lb

## 2018-11-28 DIAGNOSIS — Z7982 Long term (current) use of aspirin: Secondary | ICD-10-CM | POA: Insufficient documentation

## 2018-11-28 DIAGNOSIS — R110 Nausea: Secondary | ICD-10-CM | POA: Diagnosis not present

## 2018-11-28 DIAGNOSIS — C946 Myelodysplastic disease, not classified: Secondary | ICD-10-CM | POA: Insufficient documentation

## 2018-11-28 DIAGNOSIS — R5383 Other fatigue: Secondary | ICD-10-CM | POA: Insufficient documentation

## 2018-11-28 DIAGNOSIS — Z79899 Other long term (current) drug therapy: Secondary | ICD-10-CM

## 2018-11-28 DIAGNOSIS — D473 Essential (hemorrhagic) thrombocythemia: Secondary | ICD-10-CM | POA: Diagnosis not present

## 2018-11-28 LAB — CMP (CANCER CENTER ONLY)
ALT: 21 U/L (ref 0–44)
ANION GAP: 11 (ref 5–15)
AST: 28 U/L (ref 15–41)
Albumin: 4.2 g/dL (ref 3.5–5.0)
Alkaline Phosphatase: 79 U/L (ref 38–126)
BUN: 17 mg/dL (ref 8–23)
CO2: 25 mmol/L (ref 22–32)
Calcium: 9.3 mg/dL (ref 8.9–10.3)
Chloride: 104 mmol/L (ref 98–111)
Creatinine: 1.08 mg/dL — ABNORMAL HIGH (ref 0.44–1.00)
GFR, Est AFR Am: 59 mL/min — ABNORMAL LOW (ref >60)
GFR, Estimated: 51 mL/min — ABNORMAL LOW (ref >60)
Glucose, Bld: 95 mg/dL (ref 70–99)
POTASSIUM: 4.3 mmol/L (ref 3.5–5.1)
Sodium: 140 mmol/L (ref 135–145)
TOTAL PROTEIN: 7.2 g/dL (ref 6.5–8.1)
Total Bilirubin: 0.5 mg/dL (ref 0.3–1.2)

## 2018-11-28 LAB — CBC WITH DIFFERENTIAL (CANCER CENTER ONLY)
Abs Immature Granulocytes: 0.03 10*3/uL (ref 0.00–0.07)
Basophils Absolute: 0.1 10*3/uL (ref 0.0–0.1)
Basophils Relative: 1 %
Eosinophils Absolute: 0.1 10*3/uL (ref 0.0–0.5)
Eosinophils Relative: 2 %
HEMATOCRIT: 45.5 % (ref 36.0–46.0)
Hemoglobin: 15 g/dL (ref 12.0–15.0)
Immature Granulocytes: 0 %
Lymphocytes Relative: 28 %
Lymphs Abs: 2 10*3/uL (ref 0.7–4.0)
MCH: 37.1 pg — ABNORMAL HIGH (ref 26.0–34.0)
MCHC: 33 g/dL (ref 30.0–36.0)
MCV: 112.6 fL — ABNORMAL HIGH (ref 80.0–100.0)
Monocytes Absolute: 0.6 10*3/uL (ref 0.1–1.0)
Monocytes Relative: 9 %
Neutro Abs: 4.3 10*3/uL (ref 1.7–7.7)
Neutrophils Relative %: 60 %
Platelet Count: 725 10*3/uL — ABNORMAL HIGH (ref 150–400)
RBC: 4.04 MIL/uL (ref 3.87–5.11)
RDW: 13.4 % (ref 11.5–15.5)
WBC Count: 7.1 10*3/uL (ref 4.0–10.5)
nRBC: 0 % (ref 0.0–0.2)

## 2018-11-28 NOTE — Progress Notes (Signed)
Hematology and Oncology Follow Up Visit  Anna Fuller [id number] 12/25/1945 73 y.o. 11/28/2018 8:16 AM      Principle Diagnosis: 73 year old woman with Jak 2+ myeloproliferative disorder diagnosed in 2007.  She presented with essential thrombocythemia.   Prior Therapy:   She is status post Anagrelide therapy discontinued due to do intolerance.   Current therapy:   Hydroxyurea since 2009.  Her dose increased to 1500 mg daily starting on 08/06/2016.  Interim History: Anna Fuller returns today for a follow-up.  Since the last visit, she reports no major changes in her health.  She has been sleeping better and averaging close to 6 hours a day.  She continues to take hydroxyurea and has not missed any doses.  She denies any new side effects associated with this medication except for mild fatigue and occasional nausea.  She does take Zofran at times which have helped with her nausea.  Her appetite, performance status remain excellent.  She denied any thrombosis or bleeding issues.  Patient denied any alteration mental status, neuropathy, confusion or dizziness.  Denies any headaches or lethargy.  Denies any night sweats, weight loss or changes in appetite.  Denied orthopnea, dyspnea on exertion or chest discomfort.  Denies shortness of breath, difficulty breathing hemoptysis or cough.  Denies any abdominal distention, nausea, early satiety or dyspepsia.  Denies any hematuria, frequency, dysuria or nocturia.  Denies any skin irritation, dryness or rash.  Denies any ecchymosis or petechiae.  Denies any lymphadenopathy or clotting.  Denies any heat or cold intolerance.  Denies any anxiety or depression.  Remaining review of system is negative.       Medications: I have reviewed the patient's current medications. Current Outpatient Prescriptions  Medication Sig Dispense Refill  . aspirin 81 MG tablet Take 81 mg by mouth daily.        . cholecalciferol (VITAMIN D) 1000 UNITS tablet Take 2,000  Units by mouth daily.      Marland Kitchen estrogens, conjugated, (PREMARIN) 0.3 MG tablet Take 1 tablet (0.3 mg total) by mouth daily. Take daily for 21 days then do not take for 7 days.  30 tablet  11  . hydroxyurea (HYDREA) 500 MG capsule Take one tab twice a day.  Every other night take an additional tablet at night.  (Alternate 1000mg  and 1500mg ).  120 capsule  3  . simvastatin (ZOCOR) 40 MG tablet Take 1 tablet (40 mg total) by mouth at bedtime.  30 tablet  11       Allergies: Reviewed with the patient again today. Allergies  Allergen Reactions  . Codeine Nausea And Vomiting  . Iodine Anaphylaxis    Contrast dye  . Penicillins Other (See Comments)     Physical Exam:   Blood pressure (!) 124/51, pulse 75, temperature 98.3 F (36.8 C), temperature source Oral, resp. rate 17, height 5\' 2"  (1.575 m), weight 138 lb 8 oz (62.8 kg), SpO2 99 %.     ECOG: 1   General appearance: Alert, awake without any distress. Head: Atraumatic without abnormalities Oropharynx: Without any thrush or ulcers. Eyes: No scleral icterus. Lymph nodes: No lymphadenopathy noted in the cervical, supraclavicular, or axillary nodes Heart:regular rate and rhythm, without any murmurs or gallops.   Lung: Clear to auscultation without any rhonchi, wheezes or dullness to percussion. Abdomin: Soft, nontender without any shifting dullness or ascites. Musculoskeletal: No clubbing or cyanosis. Neurological: No motor or sensory deficits. Skin: No rashes or lesions. Psychiatric: Mood and affect appeared normal.  Lab Results: Lab Results  Component Value Date   WBC 7.2 09/28/2018   HGB 13.9 09/28/2018   HCT 42.3 09/28/2018   MCV 113.1 (H) 09/28/2018   PLT 632 (H) 09/28/2018     Chemistry      Component Value Date/Time   NA 141 09/28/2018 0742   NA 140 09/28/2017 0811   K 3.6 09/28/2018 0742   K 3.8 09/28/2017 0811   CL 107 09/28/2018 0742   CL 105 01/30/2013 0825   CO2 24 09/28/2018 0742   CO2 25  09/28/2017 0811   BUN 11 09/28/2018 0742   BUN 14.6 09/28/2017 0811   CREATININE 1.07 (H) 09/28/2018 0742   CREATININE 0.9 09/28/2017 0811      Component Value Date/Time   CALCIUM 9.0 09/28/2018 0742   CALCIUM 9.1 09/28/2017 0811   ALKPHOS 75 09/28/2018 0742   ALKPHOS 75 09/28/2017 0811   AST 19 09/28/2018 0742   AST 23 09/28/2017 0811   ALT 16 09/28/2018 0742   ALT 23 09/28/2017 0811   BILITOT 0.5 09/28/2018 0742   BILITOT 0.46 09/28/2017 0811      Impression and Plan:  72 year old woman with:  1.  Myeloproliferative disorder presented with essential thrombocythemia that is Jak 2+ diagnosed in 2007.    She remains on hydroxyurea which has been able to control her platelet count without any major issues.  She does have side effects that are manageable at the current dose and schedule.  Her platelet count did increase as slightly although no indication for changing the dose.   Risks and benefits of continuing this treatment long-term was discussed.  Long-term complication associated with this therapy was reviewed including secondary malignancy.  For the time being she is agreeable to continue.  If her platelet count continues to increase we will add additional 500 mg of hydroxyurea a week.   2.  Thrombosis prophylaxis: No recent thrombosis episodes noted.  She is at increased risk given her myeloproliferative disorder.  She is currently on aspirin which is recommended.  3.  Constitutional symptoms: He is a related to her myeloproliferative disorder and are manageable at this time.  She does not require any additional therapy.  4.  Age-appropriate cancer screening: She remains up-to-date at this time without any changes.  5.  Follow-up: We will be in 2 months.  15  minutes was spent with the patient face-to-face today.  More than 50% of time was dedicated to reviewing laboratory data, disease status, risk of progression and answering questions regarding future plan of care.      Zola Button, MD 3/3/20208:16 AM`

## 2018-11-28 NOTE — Telephone Encounter (Signed)
Spoke with patient re 5/5 lab/fu. Patient declined printout.

## 2019-01-30 ENCOUNTER — Inpatient Hospital Stay: Payer: Medicare Other | Attending: Oncology | Admitting: Oncology

## 2019-01-30 ENCOUNTER — Inpatient Hospital Stay: Payer: Medicare Other

## 2019-01-30 ENCOUNTER — Other Ambulatory Visit: Payer: Self-pay

## 2019-01-30 VITALS — BP 125/55 | HR 72 | Temp 98.2°F | Resp 17 | Ht 62.0 in | Wt 139.6 lb

## 2019-01-30 DIAGNOSIS — D473 Essential (hemorrhagic) thrombocythemia: Secondary | ICD-10-CM

## 2019-01-30 DIAGNOSIS — Z79899 Other long term (current) drug therapy: Secondary | ICD-10-CM

## 2019-01-30 DIAGNOSIS — R110 Nausea: Secondary | ICD-10-CM

## 2019-01-30 DIAGNOSIS — R200 Anesthesia of skin: Secondary | ICD-10-CM | POA: Diagnosis not present

## 2019-01-30 DIAGNOSIS — Z7982 Long term (current) use of aspirin: Secondary | ICD-10-CM

## 2019-01-30 DIAGNOSIS — C946 Myelodysplastic disease, not classified: Secondary | ICD-10-CM

## 2019-01-30 LAB — CMP (CANCER CENTER ONLY)
ALT: 20 U/L (ref 0–44)
AST: 25 U/L (ref 15–41)
Albumin: 4.2 g/dL (ref 3.5–5.0)
Alkaline Phosphatase: 76 U/L (ref 38–126)
Anion gap: 10 (ref 5–15)
BUN: 11 mg/dL (ref 8–23)
CO2: 25 mmol/L (ref 22–32)
Calcium: 9.3 mg/dL (ref 8.9–10.3)
Chloride: 106 mmol/L (ref 98–111)
Creatinine: 1.14 mg/dL — ABNORMAL HIGH (ref 0.44–1.00)
GFR, Est AFR Am: 55 mL/min — ABNORMAL LOW (ref >60)
GFR, Estimated: 47 mL/min — ABNORMAL LOW (ref >60)
Glucose, Bld: 96 mg/dL (ref 70–99)
Potassium: 4 mmol/L (ref 3.5–5.1)
Sodium: 141 mmol/L (ref 135–145)
Total Bilirubin: 0.4 mg/dL (ref 0.3–1.2)
Total Protein: 7.2 g/dL (ref 6.5–8.1)

## 2019-01-30 LAB — CBC WITH DIFFERENTIAL (CANCER CENTER ONLY)
Abs Immature Granulocytes: 0.03 10*3/uL (ref 0.00–0.07)
Basophils Absolute: 0.1 10*3/uL (ref 0.0–0.1)
Basophils Relative: 1 %
Eosinophils Absolute: 0.1 10*3/uL (ref 0.0–0.5)
Eosinophils Relative: 1 %
HCT: 47.4 % — ABNORMAL HIGH (ref 36.0–46.0)
Hemoglobin: 15.5 g/dL — ABNORMAL HIGH (ref 12.0–15.0)
Immature Granulocytes: 0 %
Lymphocytes Relative: 25 %
Lymphs Abs: 1.9 10*3/uL (ref 0.7–4.0)
MCH: 36.6 pg — ABNORMAL HIGH (ref 26.0–34.0)
MCHC: 32.7 g/dL (ref 30.0–36.0)
MCV: 111.8 fL — ABNORMAL HIGH (ref 80.0–100.0)
Monocytes Absolute: 0.7 10*3/uL (ref 0.1–1.0)
Monocytes Relative: 9 %
Neutro Abs: 4.6 10*3/uL (ref 1.7–7.7)
Neutrophils Relative %: 64 %
Platelet Count: 668 10*3/uL — ABNORMAL HIGH (ref 150–400)
RBC: 4.24 MIL/uL (ref 3.87–5.11)
RDW: 14.4 % (ref 11.5–15.5)
WBC Count: 7.3 10*3/uL (ref 4.0–10.5)
nRBC: 0 % (ref 0.0–0.2)

## 2019-01-30 NOTE — Progress Notes (Signed)
Hematology and Oncology Follow Up Visit  Anna Fuller [id number] 1946-01-16 73 y.o. 01/30/2019 8:10 AM      Principle Diagnosis: 73 year old woman with essential thrombocythemia after presenting with Jak 2+ myeloproliferative disorder diagnosed in 2007.   Prior Therapy:   She is status post Anagrelide therapy discontinued due to do intolerance.   Current therapy:   Hydroxyurea since 2009.  Her dose increased to 1500 mg daily starting on 08/06/2016.  Interim History: Anna Fuller is here for a repeat evaluation.  Since the last visit, he reports no major changes in her health.  She continues to tolerate to hydroxyurea at the current dose without any new issues.  She does report some mild nausea and scalp numbness but no other complaints.  Her appetite has been reasonable and weight is identical.  She denies any recent thrombosis or bleeding episodes.  She denied headaches, blurry vision, syncope or seizures.  Denies any fevers, chills or sweats.  Denied chest pain, palpitation, orthopnea or leg edema.  Denied cough, wheezing or hemoptysis.  Denied nausea, vomiting or abdominal pain.  Denies any constipation or diarrhea.  Denies any frequency urgency or hesitancy.  Denies any arthralgias or myalgias.  Denies any skin rashes or lesions.  Denies any bleeding or clotting tendency.  Denies any easy bruising.  Denies any hair or nail changes.  Denies any anxiety or depression.  Remaining review of system is negative.          Medications: I have reviewed the patient's current medications. Current Outpatient Prescriptions  Medication Sig Dispense Refill  . aspirin 81 MG tablet Take 81 mg by mouth daily.        . cholecalciferol (VITAMIN D) 1000 UNITS tablet Take 2,000 Units by mouth daily.      Marland Kitchen estrogens, conjugated, (PREMARIN) 0.3 MG tablet Take 1 tablet (0.3 mg total) by mouth daily. Take daily for 21 days then do not take for 7 days.  30 tablet  11  . hydroxyurea (HYDREA) 500 MG  capsule Take one tab twice a day.  Every other night take an additional tablet at night.  (Alternate 1000mg  and 1500mg ).  120 capsule  3  . simvastatin (ZOCOR) 40 MG tablet Take 1 tablet (40 mg total) by mouth at bedtime.  30 tablet  11       Allergies: Reviewed with the patient again today. Allergies  Allergen Reactions  . Codeine Nausea And Vomiting  . Iodine Anaphylaxis    Contrast dye  . Penicillins Other (See Comments)     Physical Exam:    Blood pressure (!) 125/55, pulse 72, temperature 98.2 F (36.8 C), temperature source Oral, resp. rate 17, height 5\' 2"  (1.575 m), weight 139 lb 9.6 oz (63.3 kg), SpO2 98 %.     ECOG: 1    General appearance: Comfortable appearing without any discomfort Head: Normocephalic without any trauma Oropharynx: Mucous membranes are moist and pink without any thrush or ulcers. Eyes: Pupils are equal and round reactive to light. Lymph nodes: No cervical, supraclavicular, inguinal or axillary lymphadenopathy.   Heart:regular rate and rhythm.  S1 and S2 without leg edema. Lung: Clear without any rhonchi or wheezes.  No dullness to percussion. Abdomin: Soft, nontender, nondistended with good bowel sounds.  No hepatosplenomegaly. Musculoskeletal: No joint deformity or effusion.  Full range of motion noted. Neurological: No deficits noted on motor, sensory and deep tendon reflex exam. Skin: No petechial rash or dryness.  Appeared moist.  Lab Results: Lab Results  Component Value Date   WBC 7.1 11/28/2018   HGB 15.0 11/28/2018   HCT 45.5 11/28/2018   MCV 112.6 (H) 11/28/2018   PLT 725 (H) 11/28/2018     Chemistry      Component Value Date/Time   NA 140 11/28/2018 0808   NA 140 09/28/2017 0811   K 4.3 11/28/2018 0808   K 3.8 09/28/2017 0811   CL 104 11/28/2018 0808   CL 105 01/30/2013 0825   CO2 25 11/28/2018 0808   CO2 25 09/28/2017 0811   BUN 17 11/28/2018 0808   BUN 14.6 09/28/2017 0811   CREATININE 1.08 (H)  11/28/2018 0808   CREATININE 0.9 09/28/2017 0811      Component Value Date/Time   CALCIUM 9.3 11/28/2018 0808   CALCIUM 9.1 09/28/2017 0811   ALKPHOS 79 11/28/2018 0808   ALKPHOS 75 09/28/2017 0811   AST 28 11/28/2018 0808   AST 23 09/28/2017 0811   ALT 21 11/28/2018 0808   ALT 23 09/28/2017 0811   BILITOT 0.5 11/28/2018 0808   BILITOT 0.46 09/28/2017 0811      Impression and Plan:  73 year old woman with:  1.  Essential thrombocythemia diagnosed in 2007.  She was found to have Jak 2+ with elevation in her platelet count.  She is currently on hydroxyurea 1500 mg daily with recent increase in her platelet count.  Risks and benefits of continuing this medication versus increasing the dosage moving forward was discussed.  Her platelet count today has decreased slightly since the last visit currently at 668.  For the time being we will keep the same dose and schedule.   2.  Thrombosis prophylaxis: Continues to be on aspirin without any recent thrombosis episodes.  3.  Constitutional symptoms: Manageable at this time without any further deterioration.  No additional treatment is needed.  4.  Age-appropriate cancer screening: She is up-to-date without any issues at this time.  5.  Follow-up: In 2 months for repeat evaluation.  15  minutes was spent with the patient face-to-face today.  More than 50% of time was dedicated to discussing her disease status, reviewing laboratory data, options of therapy and answering questions regarding future plan of care.     Zola Button, MD 5/5/20208:10 AM`

## 2019-01-31 ENCOUNTER — Telehealth: Payer: Self-pay | Admitting: Oncology

## 2019-01-31 NOTE — Telephone Encounter (Signed)
Called regarding schedule °

## 2019-03-01 DIAGNOSIS — H353131 Nonexudative age-related macular degeneration, bilateral, early dry stage: Secondary | ICD-10-CM | POA: Diagnosis not present

## 2019-03-01 DIAGNOSIS — H5213 Myopia, bilateral: Secondary | ICD-10-CM | POA: Diagnosis not present

## 2019-03-28 ENCOUNTER — Inpatient Hospital Stay (HOSPITAL_BASED_OUTPATIENT_CLINIC_OR_DEPARTMENT_OTHER): Payer: Medicare Other | Admitting: Oncology

## 2019-03-28 ENCOUNTER — Inpatient Hospital Stay: Payer: Medicare Other | Attending: Oncology

## 2019-03-28 ENCOUNTER — Encounter: Payer: Self-pay | Admitting: Oncology

## 2019-03-28 ENCOUNTER — Other Ambulatory Visit: Payer: Self-pay

## 2019-03-28 ENCOUNTER — Telehealth: Payer: Self-pay | Admitting: Oncology

## 2019-03-28 VITALS — BP 123/66 | HR 70 | Temp 98.7°F | Resp 18 | Wt 138.7 lb

## 2019-03-28 DIAGNOSIS — Z7982 Long term (current) use of aspirin: Secondary | ICD-10-CM | POA: Insufficient documentation

## 2019-03-28 DIAGNOSIS — R61 Generalized hyperhidrosis: Secondary | ICD-10-CM | POA: Diagnosis not present

## 2019-03-28 DIAGNOSIS — E785 Hyperlipidemia, unspecified: Secondary | ICD-10-CM | POA: Diagnosis not present

## 2019-03-28 DIAGNOSIS — D473 Essential (hemorrhagic) thrombocythemia: Secondary | ICD-10-CM | POA: Diagnosis not present

## 2019-03-28 DIAGNOSIS — Z79899 Other long term (current) drug therapy: Secondary | ICD-10-CM

## 2019-03-28 DIAGNOSIS — G629 Polyneuropathy, unspecified: Secondary | ICD-10-CM

## 2019-03-28 DIAGNOSIS — C946 Myelodysplastic disease, not classified: Secondary | ICD-10-CM

## 2019-03-28 LAB — CBC WITH DIFFERENTIAL (CANCER CENTER ONLY)
Abs Immature Granulocytes: 0.03 10*3/uL (ref 0.00–0.07)
Basophils Absolute: 0.1 10*3/uL (ref 0.0–0.1)
Basophils Relative: 1 %
Eosinophils Absolute: 0.1 10*3/uL (ref 0.0–0.5)
Eosinophils Relative: 2 %
HCT: 45 % (ref 36.0–46.0)
Hemoglobin: 15.2 g/dL — ABNORMAL HIGH (ref 12.0–15.0)
Immature Granulocytes: 1 %
Lymphocytes Relative: 26 %
Lymphs Abs: 1.6 10*3/uL (ref 0.7–4.0)
MCH: 36.7 pg — ABNORMAL HIGH (ref 26.0–34.0)
MCHC: 33.8 g/dL (ref 30.0–36.0)
MCV: 108.7 fL — ABNORMAL HIGH (ref 80.0–100.0)
Monocytes Absolute: 0.7 10*3/uL (ref 0.1–1.0)
Monocytes Relative: 11 %
Neutro Abs: 3.8 10*3/uL (ref 1.7–7.7)
Neutrophils Relative %: 59 %
Platelet Count: 646 10*3/uL — ABNORMAL HIGH (ref 150–400)
RBC: 4.14 MIL/uL (ref 3.87–5.11)
RDW: 14.8 % (ref 11.5–15.5)
WBC Count: 6.3 10*3/uL (ref 4.0–10.5)
nRBC: 0 % (ref 0.0–0.2)

## 2019-03-28 LAB — CMP (CANCER CENTER ONLY)
ALT: 20 U/L (ref 0–44)
AST: 26 U/L (ref 15–41)
Albumin: 4.2 g/dL (ref 3.5–5.0)
Alkaline Phosphatase: 88 U/L (ref 38–126)
Anion gap: 11 (ref 5–15)
BUN: 13 mg/dL (ref 8–23)
CO2: 22 mmol/L (ref 22–32)
Calcium: 9.1 mg/dL (ref 8.9–10.3)
Chloride: 105 mmol/L (ref 98–111)
Creatinine: 1.06 mg/dL — ABNORMAL HIGH (ref 0.44–1.00)
GFR, Est AFR Am: 60 mL/min — ABNORMAL LOW (ref >60)
GFR, Estimated: 52 mL/min — ABNORMAL LOW (ref >60)
Glucose, Bld: 94 mg/dL (ref 70–99)
Potassium: 4 mmol/L (ref 3.5–5.1)
Sodium: 138 mmol/L (ref 135–145)
Total Bilirubin: 0.5 mg/dL (ref 0.3–1.2)
Total Protein: 6.9 g/dL (ref 6.5–8.1)

## 2019-03-28 NOTE — Telephone Encounter (Signed)
Scheduled appt per 7/1 los.  Patient declined calendar and avs.

## 2019-03-28 NOTE — Progress Notes (Signed)
Hematology and Oncology Follow Up Visit  Anna Fuller [id number] [birth year] 73 y.o. 03/28/2019 8:17 AM      Principle Diagnosis: 73 year old woman with myeloproliferative disorder diagnosed in 2007.  She was found to have essential thrombocythemia after  with Jak 2+ mutation.  Prior Therapy:   She is status post Anagrelide therapy discontinued due to do intolerance.   Current therapy:   Hydroxyurea since 2009.  Her dose increased to 1500 mg daily starting on 08/06/2016.  Interim History: Anna Fuller returns today for a repeat evaluation.  Since the last visit, she reports no major changes in her health.  She continues to tolerate hydroxyurea without any new complaints.  She does report some constitutional symptoms predominantly night sweats that are infrequent.  She does report some neuropathy that is intermittent.  She eats well and continues to work in [employer] regularly.  Patient denied any alteration mental status, neuropathy, confusion or dizziness.  Denies any headaches or lethargy.  Denies any weight loss or changes in appetite.  Denied orthopnea, dyspnea on exertion or chest discomfort.  Denies shortness of breath, difficulty breathing hemoptysis or cough.  Denies any abdominal distention, nausea, early satiety or dyspepsia.  Denies any hematuria, frequency, dysuria or nocturia.  Denies any skin irritation, dryness or rash.  Denies any ecchymosis or petechiae.  Denies any lymphadenopathy or clotting.  Denies any heat or cold intolerance.  Denies any anxiety or depression.  Remaining review of system is negative.            Medications: I have reviewed the patient's current medications and appeared without any change today. Current Outpatient Prescriptions  Medication Sig Dispense Refill  . aspirin 81 MG tablet Take 81 mg by mouth daily.        . cholecalciferol (VITAMIN D) 1000 UNITS tablet Take 2,000 Units by mouth daily.      Marland Kitchen estrogens, conjugated, (PREMARIN)  0.3 MG tablet Take 1 tablet (0.3 mg total) by mouth daily. Take daily for 21 days then do not take for 7 days.  30 tablet  11  . hydroxyurea (HYDREA) 500 MG capsule Take one tab twice a day.  Every other night take an additional tablet at night.  (Alternate 1000mg  and 1500mg ).  120 capsule  3  . simvastatin (ZOCOR) 40 MG tablet Take 1 tablet (40 mg total) by mouth at bedtime.  30 tablet  11       Allergies: Reviewed with the patient again today. Allergies  Allergen Reactions  . Codeine Nausea And Vomiting  . Iodine Anaphylaxis    Contrast dye  . Penicillins Other (See Comments)     Physical Exam:    Blood pressure 123/66, pulse 70, temperature 98.7 F (37.1 C), temperature source Oral, resp. rate 18, weight 138 lb 11.2 oz (62.9 kg), SpO2 99 %.      ECOG: 1     General appearance: Alert, awake without any distress. Head: Atraumatic without abnormalities Oropharynx: Without any thrush or ulcers. Eyes: No scleral icterus. Lymph nodes: No lymphadenopathy noted in the cervical, supraclavicular, or axillary nodes Heart:regular rate and rhythm, without any murmurs or gallops.   Lung: Clear to auscultation without any rhonchi, wheezes or dullness to percussion. Abdomin: Soft, nontender without any shifting dullness or ascites. Musculoskeletal: No clubbing or cyanosis. Neurological: No motor or sensory deficits. Skin: No rashes or lesions.          Lab Results: Lab Results  Component Value Date   WBC 7.3 01/30/2019  HGB 15.5 (H) 01/30/2019   HCT 47.4 (H) 01/30/2019   MCV 111.8 (H) 01/30/2019   PLT 668 (H) 01/30/2019     Chemistry      Component Value Date/Time   NA 141 01/30/2019 0759   NA 140 09/28/2017 0811   K 4.0 01/30/2019 0759   K 3.8 09/28/2017 0811   CL 106 01/30/2019 0759   CL 105 01/30/2013 0825   CO2 25 01/30/2019 0759   CO2 25 09/28/2017 0811   BUN 11 01/30/2019 0759   BUN 14.6 09/28/2017 0811   CREATININE 1.14 (H) 01/30/2019 0759    CREATININE 0.9 09/28/2017 0811      Component Value Date/Time   CALCIUM 9.3 01/30/2019 0759   CALCIUM 9.1 09/28/2017 0811   ALKPHOS 76 01/30/2019 0759   ALKPHOS 75 09/28/2017 0811   AST 25 01/30/2019 0759   AST 23 09/28/2017 0811   ALT 20 01/30/2019 0759   ALT 23 09/28/2017 0811   BILITOT 0.4 01/30/2019 0759   BILITOT 0.46 09/28/2017 0811      Impression and Plan:  73 year old woman with:  1.  Myeloproliferative disorder manifesting with essential thrombocythemia diagnosed in 2007 with Jak 2+ mutation.  She remains on hydroxyurea at this time with platelet count reasonably controlled.  The natural course of this disease was discussed today and alternative treatment options were also reiterated.  Dose modification of hydroxyurea was also reviewed.  At this time we will continue the same dose and schedule.  Laboratory data from today reviewed and showed a platelet count under reasonable control.   2.  Thrombosis prophylaxis: Risk of thrombosis was assessed today and remains reasonably low.  She remains on aspirin.  3.  Constitutional symptoms: We continue discussed constitutional symptoms associated with this condition.  These would include pruritus, abdominal pain, night sweats among others.  Her symptoms are rather mild and manageable.  4.  Age-appropriate cancer screening: We continue to emphasize age-appropriate cancer screening.  This will include colon cancer breast cancer among others.  She is up-to-date at this time.  5.  Follow-up: In 2 months for repeat evaluation.  25  minutes was spent with the patient face-to-face today.  More than 50% of time was spent on reviewing laboratory data, disease status update, treatment options, as well as future management approaches.     Zola Button, MD 7/1/20208:17 AM`

## 2019-05-23 ENCOUNTER — Other Ambulatory Visit: Payer: Self-pay

## 2019-05-23 ENCOUNTER — Encounter: Payer: Self-pay | Admitting: Internal Medicine

## 2019-05-23 ENCOUNTER — Ambulatory Visit (INDEPENDENT_AMBULATORY_CARE_PROVIDER_SITE_OTHER): Payer: Medicare Other | Admitting: Internal Medicine

## 2019-05-23 DIAGNOSIS — Z23 Encounter for immunization: Secondary | ICD-10-CM | POA: Diagnosis not present

## 2019-05-23 NOTE — Progress Notes (Signed)
Flu vaccine given by CMA 

## 2019-05-23 NOTE — Patient Instructions (Signed)
Patient received a flu shot, L deltoid.

## 2019-05-29 ENCOUNTER — Inpatient Hospital Stay: Payer: Medicare Other | Attending: Oncology

## 2019-05-29 ENCOUNTER — Other Ambulatory Visit: Payer: Self-pay

## 2019-05-29 ENCOUNTER — Inpatient Hospital Stay (HOSPITAL_BASED_OUTPATIENT_CLINIC_OR_DEPARTMENT_OTHER): Payer: Medicare Other | Admitting: Oncology

## 2019-05-29 VITALS — BP 128/61 | HR 76 | Temp 97.8°F | Resp 17 | Ht 62.0 in | Wt 139.7 lb

## 2019-05-29 DIAGNOSIS — Z880 Allergy status to penicillin: Secondary | ICD-10-CM | POA: Insufficient documentation

## 2019-05-29 DIAGNOSIS — Z86718 Personal history of other venous thrombosis and embolism: Secondary | ICD-10-CM | POA: Insufficient documentation

## 2019-05-29 DIAGNOSIS — Z7901 Long term (current) use of anticoagulants: Secondary | ICD-10-CM | POA: Diagnosis not present

## 2019-05-29 DIAGNOSIS — Z888 Allergy status to other drugs, medicaments and biological substances status: Secondary | ICD-10-CM | POA: Insufficient documentation

## 2019-05-29 DIAGNOSIS — Z885 Allergy status to narcotic agent status: Secondary | ICD-10-CM | POA: Insufficient documentation

## 2019-05-29 DIAGNOSIS — Z79899 Other long term (current) drug therapy: Secondary | ICD-10-CM | POA: Diagnosis not present

## 2019-05-29 DIAGNOSIS — M2550 Pain in unspecified joint: Secondary | ICD-10-CM | POA: Insufficient documentation

## 2019-05-29 DIAGNOSIS — D473 Essential (hemorrhagic) thrombocythemia: Secondary | ICD-10-CM | POA: Diagnosis not present

## 2019-05-29 DIAGNOSIS — C946 Myelodysplastic disease, not classified: Secondary | ICD-10-CM | POA: Insufficient documentation

## 2019-05-29 LAB — CBC WITH DIFFERENTIAL (CANCER CENTER ONLY)
Abs Immature Granulocytes: 0.02 10*3/uL (ref 0.00–0.07)
Basophils Absolute: 0.1 10*3/uL (ref 0.0–0.1)
Basophils Relative: 1 %
Eosinophils Absolute: 0.1 10*3/uL (ref 0.0–0.5)
Eosinophils Relative: 2 %
HCT: 43.4 % (ref 36.0–46.0)
Hemoglobin: 14.8 g/dL (ref 12.0–15.0)
Immature Granulocytes: 0 %
Lymphocytes Relative: 28 %
Lymphs Abs: 1.8 10*3/uL (ref 0.7–4.0)
MCH: 37.1 pg — ABNORMAL HIGH (ref 26.0–34.0)
MCHC: 34.1 g/dL (ref 30.0–36.0)
MCV: 108.8 fL — ABNORMAL HIGH (ref 80.0–100.0)
Monocytes Absolute: 0.6 10*3/uL (ref 0.1–1.0)
Monocytes Relative: 9 %
Neutro Abs: 3.9 10*3/uL (ref 1.7–7.7)
Neutrophils Relative %: 60 %
Platelet Count: 635 10*3/uL — ABNORMAL HIGH (ref 150–400)
RBC: 3.99 MIL/uL (ref 3.87–5.11)
RDW: 14.6 % (ref 11.5–15.5)
WBC Count: 6.5 10*3/uL (ref 4.0–10.5)
nRBC: 0 % (ref 0.0–0.2)

## 2019-05-29 LAB — CMP (CANCER CENTER ONLY)
ALT: 19 U/L (ref 0–44)
AST: 24 U/L (ref 15–41)
Albumin: 4 g/dL (ref 3.5–5.0)
Alkaline Phosphatase: 88 U/L (ref 38–126)
Anion gap: 8 (ref 5–15)
BUN: 10 mg/dL (ref 8–23)
CO2: 24 mmol/L (ref 22–32)
Calcium: 9.1 mg/dL (ref 8.9–10.3)
Chloride: 107 mmol/L (ref 98–111)
Creatinine: 1.01 mg/dL — ABNORMAL HIGH (ref 0.44–1.00)
GFR, Est AFR Am: >60 mL/min (ref >60)
GFR, Estimated: 55 mL/min — ABNORMAL LOW (ref >60)
Glucose, Bld: 94 mg/dL (ref 70–99)
Potassium: 4.2 mmol/L (ref 3.5–5.1)
Sodium: 139 mmol/L (ref 135–145)
Total Bilirubin: 0.6 mg/dL (ref 0.3–1.2)
Total Protein: 6.6 g/dL (ref 6.5–8.1)

## 2019-05-29 NOTE — Progress Notes (Signed)
Hematology and Oncology Follow Up Visit  Anna Fuller [id number] 06/07/1946 73 y.o. 05/29/2019 8:11 AM      Principle Diagnosis: 73 year old woman with essential thrombocythemia diagnosed in 2007.  She presented with Jak 2+ myeloproliferative disorder.   Prior Therapy:   She is status post Anagrelide therapy discontinued due to do intolerance.   Current therapy:   She started on hydroxyurea 2009. she is currently on 1500 mg daily starting on 08/06/2016.  Interim History: Anna Fuller is here for repeat evaluation.  Since her last visit, she reports no major changes in her health.  She continues to tolerate hydroxyurea with the same side effects that are manageable at this time.  She reports skin sensitivity as well as sweats at night but overall manageable.  She eats well and continues to enjoy reasonable quality of life.  She has reported mild arthralgias that are unchanged at this time.  She is still able to live independently and attends activities of daily living without any decline.  She denies any thrombosis or bleeding episodes.   She denied headaches, blurry vision, syncope or seizures.  Denies any fevers, chills or sweats.  Denied chest pain, palpitation, orthopnea or leg edema.  Denied cough, wheezing or hemoptysis.  Denied nausea, vomiting or abdominal pain.  Denies any constipation or diarrhea.  Denies any frequency urgency or hesitancy.  Denies any arthralgias or myalgias.  Denies any skin rashes or lesions.  Denies any bleeding or clotting tendency.  Denies any easy bruising.  Denies any hair or nail changes.  Denies any anxiety or depression.  Remaining review of system is negative.     Medications: Updated and reviewed. Current Outpatient Prescriptions  Medication Sig Dispense Refill  . aspirin 81 MG tablet Take 81 mg by mouth daily.        . cholecalciferol (VITAMIN D) 1000 UNITS tablet Take 2,000 Units by mouth daily.      Marland Kitchen estrogens, conjugated, (PREMARIN) 0.3 MG  tablet Take 1 tablet (0.3 mg total) by mouth daily. Take daily for 21 days then do not take for 7 days.  30 tablet  11  . hydroxyurea (HYDREA) 500 MG capsule Take one tab twice a day.  Every other night take an additional tablet at night.  (Alternate 1000mg  and 1500mg ).  120 capsule  3  . simvastatin (ZOCOR) 40 MG tablet Take 1 tablet (40 mg total) by mouth at bedtime.  30 tablet  11      Social history, family history and surgical history were reviewed without changes today.   Allergies: Reviewed with the patient again today. Allergies  Allergen Reactions  . Codeine Nausea And Vomiting  . Iodine Anaphylaxis    Contrast dye  . Penicillins Other (See Comments)     Physical Exam:    Blood pressure 128/61, pulse 76, temperature 97.8 F (36.6 C), temperature source Oral, resp. rate 17, height 5\' 2"  (1.575 m), weight 139 lb 11.2 oz (63.4 kg), SpO2 98 %.       ECOG: 1   General appearance: Comfortable appearing without any discomfort Head: Normocephalic without any trauma Oropharynx: Mucous membranes are moist and pink without any thrush or ulcers. Eyes: Pupils are equal and round reactive to light. Lymph nodes: No cervical, supraclavicular, inguinal or axillary lymphadenopathy.   Heart:regular rate and rhythm.  S1 and S2 without leg edema. Lung: Clear without any rhonchi or wheezes.  No dullness to percussion. Abdomin: Soft, nontender, nondistended with good bowel sounds.  No hepatosplenomegaly. Musculoskeletal:  No joint deformity or effusion.  Full range of motion noted. Neurological: No deficits noted on motor, sensory and deep tendon reflex exam. Skin: No petechial rash or dryness.  Appeared moist.            Lab Results: Lab Results  Component Value Date   WBC 6.3 03/28/2019   HGB 15.2 (H) 03/28/2019   HCT 45.0 03/28/2019   MCV 108.7 (H) 03/28/2019   PLT 646 (H) 03/28/2019     Chemistry      Component Value Date/Time   NA 138 03/28/2019 0759   NA 140  09/28/2017 0811   K 4.0 03/28/2019 0759   K 3.8 09/28/2017 0811   CL 105 03/28/2019 0759   CL 105 01/30/2013 0825   CO2 22 03/28/2019 0759   CO2 25 09/28/2017 0811   BUN 13 03/28/2019 0759   BUN 14.6 09/28/2017 0811   CREATININE 1.06 (H) 03/28/2019 0759   CREATININE 0.9 09/28/2017 0811      Component Value Date/Time   CALCIUM 9.1 03/28/2019 0759   CALCIUM 9.1 09/28/2017 0811   ALKPHOS 88 03/28/2019 0759   ALKPHOS 75 09/28/2017 0811   AST 26 03/28/2019 0759   AST 23 09/28/2017 0811   ALT 20 03/28/2019 0759   ALT 23 09/28/2017 0811   BILITOT 0.5 03/28/2019 0759   BILITOT 0.46 09/28/2017 0811      Impression and Plan:  73 year old woman with:  1.  Essential thrombocythemia diagnosed in 2007.  She has Jak 2+ mutation.    She is currently on hydroxyurea with reasonable control of her platelet count.  She does experience few side effects associated with this medication but manageable overall.  Risks and benefits of continuing this treatment long-term.  Long-term bone marrow toxicity associated with this medication was also reiterated.  Alternative options were also reviewed and at this time her counts are adequately controlled and we will keep the same dose and schedule.  Her CBC was personally reviewed and discussed with her platelet count continues to be close to 600,000 range.  Increasing the dose of hydroxyurea will likely result in more side effects and unnecessary at this time.     2.  Thrombosis prophylaxis: She is currently on aspirin without any recent thrombosis episodes.  3.  Constitutional symptoms: Remains manageable at this time.  These are related to her myeloproliferative disorder and does not require any additional therapy.   4.  Age-appropriate cancer screening: She is up-to-date including colon and breast cancer screening.    5.  Follow-up: She will return in 8 weeks for a repeat evaluation and repeat laboratory testing.  25  minutes was spent with the  patient face-to-face today.  More than 50% of time was dedicated to reviewing the natural course of her disease, treatment options and complications related to therapy.     Zola Button, MD 9/1/20208:11 AM`

## 2019-05-30 ENCOUNTER — Telehealth: Payer: Self-pay | Admitting: Oncology

## 2019-05-30 NOTE — Telephone Encounter (Signed)
Called and spoke with patient. Confirmed appts  °

## 2019-06-12 ENCOUNTER — Other Ambulatory Visit: Payer: Self-pay | Admitting: Internal Medicine

## 2019-06-12 DIAGNOSIS — Z1231 Encounter for screening mammogram for malignant neoplasm of breast: Secondary | ICD-10-CM

## 2019-07-31 ENCOUNTER — Inpatient Hospital Stay: Payer: Medicare Other

## 2019-07-31 ENCOUNTER — Other Ambulatory Visit: Payer: Self-pay

## 2019-07-31 ENCOUNTER — Inpatient Hospital Stay: Payer: Medicare Other | Attending: Oncology | Admitting: Oncology

## 2019-07-31 VITALS — BP 129/52 | HR 70 | Temp 97.8°F | Resp 18 | Wt 138.9 lb

## 2019-07-31 DIAGNOSIS — Z79899 Other long term (current) drug therapy: Secondary | ICD-10-CM | POA: Insufficient documentation

## 2019-07-31 DIAGNOSIS — Z880 Allergy status to penicillin: Secondary | ICD-10-CM | POA: Insufficient documentation

## 2019-07-31 DIAGNOSIS — R7989 Other specified abnormal findings of blood chemistry: Secondary | ICD-10-CM | POA: Insufficient documentation

## 2019-07-31 DIAGNOSIS — D473 Essential (hemorrhagic) thrombocythemia: Secondary | ICD-10-CM | POA: Diagnosis not present

## 2019-07-31 DIAGNOSIS — Z885 Allergy status to narcotic agent status: Secondary | ICD-10-CM | POA: Insufficient documentation

## 2019-07-31 DIAGNOSIS — C946 Myelodysplastic disease, not classified: Secondary | ICD-10-CM | POA: Diagnosis not present

## 2019-07-31 DIAGNOSIS — Z888 Allergy status to other drugs, medicaments and biological substances status: Secondary | ICD-10-CM | POA: Insufficient documentation

## 2019-07-31 DIAGNOSIS — R5383 Other fatigue: Secondary | ICD-10-CM | POA: Diagnosis not present

## 2019-07-31 DIAGNOSIS — Z7982 Long term (current) use of aspirin: Secondary | ICD-10-CM | POA: Diagnosis not present

## 2019-07-31 LAB — CBC WITH DIFFERENTIAL (CANCER CENTER ONLY)
Abs Immature Granulocytes: 0.04 10*3/uL (ref 0.00–0.07)
Basophils Absolute: 0.1 10*3/uL (ref 0.0–0.1)
Basophils Relative: 1 %
Eosinophils Absolute: 0.2 10*3/uL (ref 0.0–0.5)
Eosinophils Relative: 2 %
HCT: 41.4 % (ref 36.0–46.0)
Hemoglobin: 14.2 g/dL (ref 12.0–15.0)
Immature Granulocytes: 1 %
Lymphocytes Relative: 23 %
Lymphs Abs: 1.8 10*3/uL (ref 0.7–4.0)
MCH: 38.5 pg — ABNORMAL HIGH (ref 26.0–34.0)
MCHC: 34.3 g/dL (ref 30.0–36.0)
MCV: 112.2 fL — ABNORMAL HIGH (ref 80.0–100.0)
Monocytes Absolute: 0.7 10*3/uL (ref 0.1–1.0)
Monocytes Relative: 9 %
Neutro Abs: 5.1 10*3/uL (ref 1.7–7.7)
Neutrophils Relative %: 64 %
Platelet Count: 826 10*3/uL — ABNORMAL HIGH (ref 150–400)
RBC: 3.69 MIL/uL — ABNORMAL LOW (ref 3.87–5.11)
RDW: 14.6 % (ref 11.5–15.5)
WBC Count: 7.9 10*3/uL (ref 4.0–10.5)
nRBC: 0 % (ref 0.0–0.2)

## 2019-07-31 LAB — CMP (CANCER CENTER ONLY)
ALT: 14 U/L (ref 0–44)
AST: 22 U/L (ref 15–41)
Albumin: 3.8 g/dL (ref 3.5–5.0)
Alkaline Phosphatase: 84 U/L (ref 38–126)
Anion gap: 11 (ref 5–15)
BUN: 17 mg/dL (ref 8–23)
CO2: 24 mmol/L (ref 22–32)
Calcium: 9.5 mg/dL (ref 8.9–10.3)
Chloride: 104 mmol/L (ref 98–111)
Creatinine: 1.04 mg/dL — ABNORMAL HIGH (ref 0.44–1.00)
GFR, Est AFR Am: >60 mL/min (ref >60)
GFR, Estimated: 53 mL/min — ABNORMAL LOW (ref >60)
Glucose, Bld: 95 mg/dL (ref 70–99)
Potassium: 3.8 mmol/L (ref 3.5–5.1)
Sodium: 139 mmol/L (ref 135–145)
Total Bilirubin: 0.4 mg/dL (ref 0.3–1.2)
Total Protein: 6.7 g/dL (ref 6.5–8.1)

## 2019-07-31 NOTE — Progress Notes (Signed)
Hematology and Oncology Follow Up Visit  MAECEE SCHNELL [id number] 1946/01/28 73 y.o. 07/31/2019 7:58 AM      Principle Diagnosis: 73 year old woman with JAK2 positive myeloproliferative disorder presented with thrombocytosis in 2007.    Prior Therapy:   She is status post Anagrelide therapy discontinued due to do intolerance.   Current therapy:   She started on hydroxyurea 2009.  Her current dose is 1500 mg daily starting on 08/06/2016.  Interim History: Ms. Chino returns today for a follow-up.  Since the last visit, she reports no major changes in her health.  He continues to live independently and takes care of her all affairs.  She remains active and has been working in her yard regularly.  She does report similar side effects hydroxyurea losing some fatigue and occasional skin irritation.  He denies any fevers chills weight loss.  She denies any appetite changes.  She denies abdominal discomfort.  Patient denied any alteration mental status, neuropathy, confusion or dizziness.  Denies any headaches or lethargy.  Denies any night sweats, weight loss or changes in appetite.  Denied orthopnea, dyspnea on exertion or chest discomfort.  Denies shortness of breath, difficulty breathing hemoptysis or cough.  Denies any abdominal distention, nausea, early satiety or dyspepsia.  Denies any hematuria, frequency, dysuria or nocturia.  Denies any skin irritation, dryness or rash.  Denies any ecchymosis or petechiae.  Denies any lymphadenopathy or clotting.  Denies any heat or cold intolerance.  Denies any anxiety or depression.  Remaining review of system is negative.       Medications: Unchanged on review. Current Outpatient Prescriptions  Medication Sig Dispense Refill  . aspirin 81 MG tablet Take 81 mg by mouth daily.        . cholecalciferol (VITAMIN D) 1000 UNITS tablet Take 2,000 Units by mouth daily.      Marland Kitchen estrogens, conjugated, (PREMARIN) 0.3 MG tablet Take 1 tablet (0.3 mg  total) by mouth daily. Take daily for 21 days then do not take for 7 days.  30 tablet  11  . hydroxyurea (HYDREA) 500 MG capsule Take one tab twice a day.  Every other night take an additional tablet at night.  (Alternate 1000mg  and 1500mg ).  120 capsule  3  . simvastatin (ZOCOR) 40 MG tablet Take 1 tablet (40 mg total) by mouth at bedtime.  30 tablet  11      Social history, family history and surgical history updated and reviewed today.   Allergies: Reviewed with the patient again today. Allergies  Allergen Reactions  . Codeine Nausea And Vomiting  . Iodine Anaphylaxis    Contrast dye  . Penicillins Other (See Comments)     Physical Exam:  Blood pressure (!) 129/52, pulse 70, temperature 97.8 F (36.6 C), temperature source Temporal, resp. rate 18, weight 138 lb 14.4 oz (63 kg), SpO2 98 %.   ECOG: 1    General appearance: Alert, awake without any distress. Head: Atraumatic without abnormalities Oropharynx: Without any thrush or ulcers. Eyes: No scleral icterus. Lymph nodes: No lymphadenopathy noted in the cervical, supraclavicular, or axillary nodes Heart:regular rate and rhythm, without any murmurs or gallops.   Lung: Clear to auscultation without any rhonchi, wheezes or dullness to percussion. Abdomin: Soft, nontender without any shifting dullness or ascites. Musculoskeletal: No clubbing or cyanosis. Neurological: No motor or sensory deficits. Skin: No rashes or lesions.            Lab Results: Lab Results  Component Value Date  WBC 7.9 07/31/2019   HGB 14.2 07/31/2019   HCT 41.4 07/31/2019   MCV 112.2 (H) 07/31/2019   PLT 826 (H) 07/31/2019     Chemistry      Component Value Date/Time   NA 139 05/29/2019 0759   NA 140 09/28/2017 0811   K 4.2 05/29/2019 0759   K 3.8 09/28/2017 0811   CL 107 05/29/2019 0759   CL 105 01/30/2013 0825   CO2 24 05/29/2019 0759   CO2 25 09/28/2017 0811   BUN 10 05/29/2019 0759   BUN 14.6 09/28/2017 0811    CREATININE 1.01 (H) 05/29/2019 0759   CREATININE 0.9 09/28/2017 0811      Component Value Date/Time   CALCIUM 9.1 05/29/2019 0759   CALCIUM 9.1 09/28/2017 0811   ALKPHOS 88 05/29/2019 0759   ALKPHOS 75 09/28/2017 0811   AST 24 05/29/2019 0759   AST 23 09/28/2017 0811   ALT 19 05/29/2019 0759   ALT 23 09/28/2017 0811   BILITOT 0.6 05/29/2019 0759   BILITOT 0.46 09/28/2017 0811      Impression and Plan:  73 year old woman with:  1.  JAK2 positive essential thrombocythemia noted in 2007.  She has tolerated hydroxyurea with few complications over the years but no new complaints.  Her platelet count today has been elevated at 826 which is new increase compared to her previous trend that has been under reasonable control for the last year.  Risks and benefits of continuing the current dose of hydroxyurea versus increasing.  After discussion today, I have recommended continuing the same dose and schedule and monitor her counts closely.  If the pattern of increase in her platelets persist so we will adjust the dosing.  It is also possible that the elevation in platelets or reactive process and might drop in the next visit.  She has had similar episodes like this did not require any adjustments.  She is agreeable at this time.     2.  Thrombosis prophylaxis: No recent thrombosis episodes noted.  Remains on aspirin.  3.  Constitutional symptoms: No new constitutional symptoms noted at this time.  We will continue to monitor moving forward.   4.  Age-appropriate cancer screening: I continue to encourage her to be up-to-date and she is on her age-appropriate cancer screening.    5.  Follow-up: in 2 months for repeat follow-up.  25  minutes was spent with the patient face-to-face today.  More than 50% of time was spent on reviewing laboratory data, disease status update, treatment options and answering questions regarding future plan of care.     Zola Button, MD 11/3/20207:58 AM`

## 2019-08-01 ENCOUNTER — Telehealth: Payer: Self-pay | Admitting: Oncology

## 2019-08-01 NOTE — Telephone Encounter (Signed)
Scheduled per los. Called and spoke with patient. confirmd appt  

## 2019-08-02 ENCOUNTER — Other Ambulatory Visit: Payer: Self-pay

## 2019-08-02 ENCOUNTER — Ambulatory Visit
Admission: RE | Admit: 2019-08-02 | Discharge: 2019-08-02 | Disposition: A | Discharge status: Home / Self Care | Payer: Medicare Other | Source: Ambulatory Visit | Attending: Internal Medicine | Admitting: Internal Medicine

## 2019-08-02 DIAGNOSIS — Z1231 Encounter for screening mammogram for malignant neoplasm of breast: Secondary | ICD-10-CM | POA: Diagnosis not present

## 2019-09-13 ENCOUNTER — Other Ambulatory Visit: Payer: Self-pay | Admitting: Oncology

## 2019-09-13 DIAGNOSIS — D473 Essential (hemorrhagic) thrombocythemia: Secondary | ICD-10-CM

## 2019-09-15 ENCOUNTER — Other Ambulatory Visit: Payer: Self-pay | Admitting: Internal Medicine

## 2019-10-03 ENCOUNTER — Inpatient Hospital Stay: Payer: Medicare Other

## 2019-10-03 ENCOUNTER — Inpatient Hospital Stay: Payer: Medicare Other | Attending: Oncology | Admitting: Oncology

## 2019-10-03 ENCOUNTER — Telehealth: Payer: Self-pay | Admitting: Oncology

## 2019-10-03 ENCOUNTER — Other Ambulatory Visit: Payer: Self-pay

## 2019-10-03 VITALS — BP 134/58 | HR 78 | Temp 97.8°F | Resp 18 | Ht 62.0 in | Wt 140.6 lb

## 2019-10-03 DIAGNOSIS — D473 Essential (hemorrhagic) thrombocythemia: Secondary | ICD-10-CM | POA: Insufficient documentation

## 2019-10-03 DIAGNOSIS — Z888 Allergy status to other drugs, medicaments and biological substances status: Secondary | ICD-10-CM | POA: Insufficient documentation

## 2019-10-03 DIAGNOSIS — Z880 Allergy status to penicillin: Secondary | ICD-10-CM | POA: Insufficient documentation

## 2019-10-03 DIAGNOSIS — R110 Nausea: Secondary | ICD-10-CM | POA: Diagnosis not present

## 2019-10-03 DIAGNOSIS — R5383 Other fatigue: Secondary | ICD-10-CM | POA: Insufficient documentation

## 2019-10-03 DIAGNOSIS — Z79899 Other long term (current) drug therapy: Secondary | ICD-10-CM | POA: Diagnosis not present

## 2019-10-03 DIAGNOSIS — Z885 Allergy status to narcotic agent status: Secondary | ICD-10-CM | POA: Diagnosis not present

## 2019-10-03 LAB — CBC WITH DIFFERENTIAL (CANCER CENTER ONLY)
Abs Immature Granulocytes: 0.04 10*3/uL (ref 0.00–0.07)
Basophils Absolute: 0.1 10*3/uL (ref 0.0–0.1)
Basophils Relative: 1 %
Eosinophils Absolute: 0.2 10*3/uL (ref 0.0–0.5)
Eosinophils Relative: 2 %
HCT: 43.7 % (ref 36.0–46.0)
Hemoglobin: 15 g/dL (ref 12.0–15.0)
Immature Granulocytes: 1 %
Lymphocytes Relative: 27 %
Lymphs Abs: 1.9 10*3/uL (ref 0.7–4.0)
MCH: 37.9 pg — ABNORMAL HIGH (ref 26.0–34.0)
MCHC: 34.3 g/dL (ref 30.0–36.0)
MCV: 110.4 fL — ABNORMAL HIGH (ref 80.0–100.0)
Monocytes Absolute: 0.8 10*3/uL (ref 0.1–1.0)
Monocytes Relative: 11 %
Neutro Abs: 4.3 10*3/uL (ref 1.7–7.7)
Neutrophils Relative %: 58 %
Platelet Count: 709 10*3/uL — ABNORMAL HIGH (ref 150–400)
RBC: 3.96 MIL/uL (ref 3.87–5.11)
RDW: 14.5 % (ref 11.5–15.5)
WBC Count: 7.2 10*3/uL (ref 4.0–10.5)
nRBC: 0 % (ref 0.0–0.2)

## 2019-10-03 LAB — CMP (CANCER CENTER ONLY)
ALT: 21 U/L (ref 0–44)
AST: 27 U/L (ref 15–41)
Albumin: 4.2 g/dL (ref 3.5–5.0)
Alkaline Phosphatase: 96 U/L (ref 38–126)
Anion gap: 9 (ref 5–15)
BUN: 19 mg/dL (ref 8–23)
CO2: 27 mmol/L (ref 22–32)
Calcium: 8.9 mg/dL (ref 8.9–10.3)
Chloride: 104 mmol/L (ref 98–111)
Creatinine: 1 mg/dL (ref 0.44–1.00)
GFR, Est AFR Am: >60 mL/min (ref >60)
GFR, Estimated: 55 mL/min — ABNORMAL LOW (ref >60)
Glucose, Bld: 96 mg/dL (ref 70–99)
Potassium: 4.4 mmol/L (ref 3.5–5.1)
Sodium: 140 mmol/L (ref 135–145)
Total Bilirubin: 0.5 mg/dL (ref 0.3–1.2)
Total Protein: 6.8 g/dL (ref 6.5–8.1)

## 2019-10-03 NOTE — Telephone Encounter (Signed)
Scheduled appt per 1/6 los - pt aware of appt

## 2019-10-03 NOTE — Progress Notes (Signed)
Hematology and Oncology Follow Up Visit  Anna Fuller [id number] [birth year] 74 y.o. 10/03/2019 8:49 AM      Principle Diagnosis: 75 year old woman with essential thrombocythemia diagnosed in 2007.  She has JAK2 positive mutation.  Prior Therapy:   She is status post Anagrelide therapy discontinued due to do intolerance.   Current therapy:   She started on hydroxyurea 2009.  Her current dose is 1500 mg daily starting on 08/06/2016.  Interim History: Anna Fuller presents today for return evaluation.  Since the last visit, he reports no major changes in her health.  She does report periodic nausea and fatigue although overall not dramatically different than before.  Her appetite remained about the same with slightly increased weight.  She denies any new complications related to hydroxyurea.  He denies any increased bone pain or pathological fractures.         Medications: Updated without any changes. Current Outpatient Prescriptions  Medication Sig Dispense Refill  . aspirin 81 MG tablet Take 81 mg by mouth daily.        . cholecalciferol (VITAMIN D) 1000 UNITS tablet Take 2,000 Units by mouth daily.      Marland Kitchen estrogens, conjugated, (PREMARIN) 0.3 MG tablet Take 1 tablet (0.3 mg total) by mouth daily. Take daily for 21 days then do not take for 7 days.  30 tablet  11  . hydroxyurea (HYDREA) 500 MG capsule Take one tab twice a day.  Every other night take an additional tablet at night.  (Alternate 1000mg  and 1500mg ).  120 capsule  3  . simvastatin (ZOCOR) 40 MG tablet Take 1 tablet (40 mg total) by mouth at bedtime.  30 tablet  11      Social history, family history and surgical history reviewed without changes.   Allergies: Reviewed with the patient again today. Allergies  Allergen Reactions  . Codeine Nausea And Vomiting  . Iodine Anaphylaxis    Contrast dye  . Penicillins Other (See Comments)     Physical Exam:  Blood pressure (!) 134/58, pulse 78, temperature 97.8  F (36.6 C), temperature source Temporal, resp. rate 18, height 5\' 2"  (1.575 m), weight 140 lb 9.6 oz (63.8 kg), SpO2 98 %.   ECOG: 1     General appearance: Comfortable appearing without any discomfort Head: Normocephalic without any trauma Oropharynx: Mucous membranes are moist and pink without any thrush or ulcers. Eyes: Pupils are equal and round reactive to light. Lymph nodes: No cervical, supraclavicular, inguinal or axillary lymphadenopathy.   Heart:regular rate and rhythm.  S1 and S2 without leg edema. Lung: Clear without any rhonchi or wheezes.  No dullness to percussion. Abdomin: Soft, nontender, nondistended with good bowel sounds.  No hepatosplenomegaly. Musculoskeletal: No joint deformity or effusion.  Full range of motion noted. Neurological: No deficits noted on motor, sensory and deep tendon reflex exam. Skin: No petechial rash or dryness.  Appeared moist.  Psychiatric: Mood and affect appeared appropriate.             Lab Results: Lab Results  Component Value Date   WBC 7.2 10/03/2019   HGB 15.0 10/03/2019   HCT 43.7 10/03/2019   MCV 110.4 (H) 10/03/2019   PLT 709 (H) 10/03/2019     Chemistry      Component Value Date/Time   NA 139 07/31/2019 0731   NA 140 09/28/2017 0811   K 3.8 07/31/2019 0731   K 3.8 09/28/2017 0811   CL 104 07/31/2019 0731   CL 105 01/30/2013  0825   CO2 24 07/31/2019 0731   CO2 25 09/28/2017 0811   BUN 17 07/31/2019 0731   BUN 14.6 09/28/2017 0811   CREATININE 1.04 (H) 07/31/2019 0731   CREATININE 0.9 09/28/2017 0811      Component Value Date/Time   CALCIUM 9.5 07/31/2019 0731   CALCIUM 9.1 09/28/2017 0811   ALKPHOS 84 07/31/2019 0731   ALKPHOS 75 09/28/2017 0811   AST 22 07/31/2019 0731   AST 23 09/28/2017 0811   ALT 14 07/31/2019 0731   ALT 23 09/28/2017 0811   BILITOT 0.4 07/31/2019 0731   BILITOT 0.46 09/28/2017 0811      Impression and Plan:  74 year old woman with:  1.  Essential thrombocythemia  diagnosed in 2007.  She had JAK2 positive mutation and has been on hydroxyurea since that time.  Laboratory data reviewed today and a platelet count slightly decreased from previously.  Risks and benefits of continuing hydroxyurea at the same dose and schedule was reviewed and she is agreeable to continue.     2.  Thrombosis prophylaxis: No evidence of thrombosis or bleeding noted at this time.  Continues to be on aspirin.  3.  Constitutional symptoms: Her symptoms are manageable at this time no additional treatment is needed.   4.  Age-appropriate cancer screening: Continues to be up-to-date at this time.    5.  Follow-up: In 2 months for repeat evaluation.  25  minutes was dedicated to reviewing her laboratory data, disease status update and future plan of care management.     Zola Button, MD 1/6/20218:49 AM`

## 2019-11-16 ENCOUNTER — Other Ambulatory Visit: Payer: Medicare Other | Admitting: Internal Medicine

## 2019-11-20 ENCOUNTER — Encounter: Payer: Self-pay | Admitting: Internal Medicine

## 2019-11-20 ENCOUNTER — Other Ambulatory Visit: Payer: Self-pay

## 2019-11-20 ENCOUNTER — Ambulatory Visit (INDEPENDENT_AMBULATORY_CARE_PROVIDER_SITE_OTHER): Payer: Medicare Other | Admitting: Internal Medicine

## 2019-11-20 VITALS — BP 100/70 | HR 74 | Ht 62.0 in | Wt 139.0 lb

## 2019-11-20 DIAGNOSIS — Z13220 Encounter for screening for lipoid disorders: Secondary | ICD-10-CM

## 2019-11-20 DIAGNOSIS — Z0000 Encounter for general adult medical examination without abnormal findings: Secondary | ICD-10-CM

## 2019-11-20 DIAGNOSIS — Z1589 Genetic susceptibility to other disease: Secondary | ICD-10-CM | POA: Diagnosis not present

## 2019-11-20 DIAGNOSIS — E785 Hyperlipidemia, unspecified: Secondary | ICD-10-CM | POA: Diagnosis not present

## 2019-11-20 DIAGNOSIS — Z1329 Encounter for screening for other suspected endocrine disorder: Secondary | ICD-10-CM | POA: Diagnosis not present

## 2019-11-20 DIAGNOSIS — R110 Nausea: Secondary | ICD-10-CM | POA: Diagnosis not present

## 2019-11-20 DIAGNOSIS — D473 Essential (hemorrhagic) thrombocythemia: Secondary | ICD-10-CM

## 2019-11-20 LAB — LIPID PANEL
Cholesterol: 148 mg/dL (ref <200)
HDL: 62 mg/dL (ref >=50)
LDL Cholesterol (Calc): 67 mg/dL (calc)
Non-HDL Cholesterol (Calc): 86 mg/dL (calc) (ref <130)
Total CHOL/HDL Ratio: 2.4 (calc) (ref <5.0)
Triglycerides: 111 mg/dL (ref <150)

## 2019-11-20 LAB — POCT URINALYSIS DIPSTICK
Appearance: NEGATIVE
Bilirubin, UA: NEGATIVE
Blood, UA: NEGATIVE
Glucose, UA: NEGATIVE
Ketones, UA: NEGATIVE
Leukocytes, UA: NEGATIVE
Nitrite, UA: NEGATIVE
Odor: NEGATIVE
Protein, UA: NEGATIVE
Spec Grav, UA: 1.01 (ref 1.010–1.025)
Urobilinogen, UA: 0.2 E.U./dL
pH, UA: 6.5 (ref 5.0–8.0)

## 2019-11-20 LAB — TSH: TSH: 2.28 mIU/L (ref 0.40–4.50)

## 2019-11-20 NOTE — Patient Instructions (Addendum)
It was a pleasure to see you today. Fasting labs drawn.  Continue current meds occasions and follow-up in 1 year or as needed

## 2019-11-20 NOTE — Progress Notes (Signed)
Subjective:    Patient ID: Anna Fuller, female    DOB: [birth year], 74 y.o.   MRN: [id number]  HPI 74 year old Female for health maintenace exam, Medicare wellness and evaluation of medical issues.  She has a history of essential thrombocytosis Jak 2 myeloproliferative disorder diagnosed in 2007.  She is followed by Dr. Alen Blew.  She is allergic to contrast dye cortisone and penicillin.  History of hyperlipidemia treated with simvastatin.  Longstanding history of being on estrogen replacement and does not want to be taken off.  Hospitalized with fever of unknown origin 68, cholecystectomy 1993, hysterectomy with bilateral oophorectomy 1991.  Right wrist surgery 2008.  Foot surgery both feet 1965, 1968, 1969.  Has had left and right knee surgeries.  Social history: She is single and retired.  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  Completed 2 years of college.  Does not smoke or consume alcohol.  Enjoys walking and doing yard work.  Family history: Her sister, [name] is also a patient.  Lives in [place].  No other siblings.  Father died at age 29 with complications of diabetes.  Mother died at age 40 of pulmonary fibrosis.  She had tetanus immunization in 2006 but had significant local reaction so that will not be repeated at her age.  Recently received 2 Moderna vaccines for COVID-19.     Fasting labs drawn including TSH and lipids and are normal. Has had other labs recently at [facility].  Colonoscopy done 2019 and due again 2024.    Review of Systems  Respiratory: Negative.   Cardiovascular: Negative.   Gastrointestinal: Negative.   Genitourinary: Negative.   Neurological: Negative.   Psychiatric/Behavioral: Negative.    had fever with second Moderna Covid-19 vaccine for a few hours otherwise did OK     Objective:   Physical Exam  Blood pressure 100/70, pulse 74 temperature 98  degrees orally weight 139 pounds BMI 25.4 today.  Skin warm and dry.  Nodes none.  TMs are clear.  Neck is supple.  No thyromegaly.  No carotid bruits.  Chest clear to auscultation.  Cardiac exam regular rate and rhythm normal S1 and S2.  Abdomen soft nondistended without hepatosplenomegaly masses or tenderness.  Pelvic deferred due to hysterectomy BSO 1991.  No lower extremity edema.  Neuro intact without focal deficits.  Thought affect and judgment are normal.      Assessment & Plan:  Essential thrombocytosis-recent platelet count at cancer center was 709,000 and previously had been 826,003 months previously.  She is maintained on Hydrea.  Hyperlipidemia-treated with Zocor and lipid panel is normal  Estrogen replacement longstanding and does not want to be off of it.  Has had total abdominal hysterectomy BSO  Plan: Continue current medications and follow-up in 1 year or as needed.  Continue follow-up with oncologist.  Subjective:   Patient presents for Medicare Annual/Subsequent preventive examination.  Review Past Medical/Family/Social: See above   Risk Factors  Current exercise habits: Housework and walking Dietary issues discussed: Low-fat low carbohydrate  Cardiac risk factors: Hyperlipidemia Depression Screen  (Note: if answer to either of the following is "Yes", a more complete depression screening is indicated)   Over the past two weeks, have you felt down, depressed or hopeless? No  Over the past two weeks, have you felt little interest or pleasure in doing things? No Have you lost interest or pleasure in daily life? No Do you often feel  hopeless? No Do you cry easily over simple problems? No   Activities of Daily Living  In your present state of health, do you have any difficulty performing the following activities?:   Driving? No  Managing money? No  Feeding yourself? No  Getting from bed to chair? No  Climbing a flight of stairs? No  Preparing food and eating?:  No  Bathing or showering? No  Getting dressed: No  Getting to the toilet? No  Using the toilet:No  Moving around from place to place: No  In the past year have you fallen or had a near fall?:No  Are you sexually active? No  Do you have more than one partner? No   Hearing Difficulties: No  Do you often ask people to speak up or repeat themselves? No  Do you experience ringing or noises in your ears? No  Do you have difficulty understanding soft or whispered voices? No  Do you feel that you have a problem with memory? No Do you often misplace items? No    Home Safety:  Do you have a smoke alarm at your residence? Yes Do you have grab bars in the bathroom?  None Do you have throw rugs in your house?  None   Cognitive Testing  Alert? Yes Normal Appearance?Yes  Oriented to person? Yes Place? Yes  Time? Yes  Recall of three objects? Yes  Can perform simple calculations? Yes  Displays appropriate judgment?Yes  Can read the correct time from a watch face?Yes   List the Names of Other Physician/Practitioners you currently use:  See referral list for the physicians patient is currently seeing.  Dr. Alen Blew, oncologist   Review of Systems: See above  Objective:     General appearance: Appears younger than stated age and trim Head: Normocephalic, without obvious abnormality, atraumatic  Eyes: conj clear, EOMi PEERLA  Ears: normal TM's and external ear canals both ears  Nose: Nares normal. Septum midline. Mucosa normal. No drainage or sinus tenderness.  Throat: lips, mucosa, and tongue normal; teeth and gums normal  Neck: no adenopathy, no carotid bruit, no JVD, supple, symmetrical, trachea midline and thyroid not enlarged, symmetric, no tenderness/mass/nodules  No CVA tenderness.  Lungs: clear to auscultation bilaterally  Breasts: normal appearance, no masses  Heart: regular rate and rhythm, S1, S2 normal, no murmur, click, rub or gallop  Abdomen: soft, non-tender; bowel  sounds normal; no masses, no organomegaly  Musculoskeletal: ROM normal in all joints, no crepitus, no deformity, Normal muscle strengthen. Back  is symmetric, no curvature. Skin: Skin color, texture, turgor normal. No rashes or lesions  Lymph nodes: Cervical, supraclavicular, and axillary nodes normal.  Neurologic: CN 2 -12 Normal, Normal symmetric reflexes. Normal coordination and gait  Psych: Alert & Oriented x 3, Mood appear stable.    Assessment:    Annual wellness medicare exam   Plan:    During the course of the visit the patient was educated and counseled about appropriate screening and preventive services including:   Has had COVID-19 vaccines  Had mammogram in November 2020  Had colonoscopy in August 2019     Patient Instructions (the written plan) was given to the patient.  Medicare Attestation  I have personally reviewed:  The patient's medical and social history  Their use of alcohol, tobacco or illicit drugs  Their current medications and supplements  The patient's functional ability including ADLs,fall risks, home safety risks, cognitive, and hearing and visual impairment  Diet and physical activities  Evidence  for depression or mood disorders  The patient's weight, height, BMI, and visual acuity have been recorded in the chart. I have made referrals, counseling, and provided education to the patient based on review of the above and I have provided the patient with a written personalized care plan for preventive services.

## 2019-11-25 ENCOUNTER — Encounter: Payer: Self-pay | Admitting: Internal Medicine

## 2019-11-29 ENCOUNTER — Other Ambulatory Visit: Payer: Self-pay

## 2019-11-29 DIAGNOSIS — D473 Essential (hemorrhagic) thrombocythemia: Secondary | ICD-10-CM

## 2019-11-30 ENCOUNTER — Inpatient Hospital Stay: Payer: Medicare Other

## 2019-11-30 ENCOUNTER — Inpatient Hospital Stay: Payer: Medicare Other | Attending: Oncology | Admitting: Oncology

## 2019-11-30 ENCOUNTER — Other Ambulatory Visit: Payer: Self-pay

## 2019-11-30 ENCOUNTER — Telehealth: Payer: Self-pay | Admitting: Oncology

## 2019-11-30 VITALS — BP 125/58 | HR 71 | Temp 98.7°F | Resp 18 | Ht 62.0 in | Wt 140.1 lb

## 2019-11-30 DIAGNOSIS — D473 Essential (hemorrhagic) thrombocythemia: Secondary | ICD-10-CM | POA: Diagnosis not present

## 2019-11-30 DIAGNOSIS — Z888 Allergy status to other drugs, medicaments and biological substances status: Secondary | ICD-10-CM | POA: Insufficient documentation

## 2019-11-30 DIAGNOSIS — Z885 Allergy status to narcotic agent status: Secondary | ICD-10-CM | POA: Insufficient documentation

## 2019-11-30 DIAGNOSIS — Z880 Allergy status to penicillin: Secondary | ICD-10-CM | POA: Insufficient documentation

## 2019-11-30 DIAGNOSIS — R7989 Other specified abnormal findings of blood chemistry: Secondary | ICD-10-CM | POA: Diagnosis not present

## 2019-11-30 DIAGNOSIS — Z79899 Other long term (current) drug therapy: Secondary | ICD-10-CM | POA: Insufficient documentation

## 2019-11-30 DIAGNOSIS — M2550 Pain in unspecified joint: Secondary | ICD-10-CM | POA: Diagnosis not present

## 2019-11-30 DIAGNOSIS — M791 Myalgia, unspecified site: Secondary | ICD-10-CM | POA: Diagnosis not present

## 2019-11-30 DIAGNOSIS — C946 Myelodysplastic disease, not classified: Secondary | ICD-10-CM | POA: Insufficient documentation

## 2019-11-30 LAB — CMP (CANCER CENTER ONLY)
ALT: 14 U/L (ref 0–44)
AST: 20 U/L (ref 15–41)
Albumin: 4 g/dL (ref 3.5–5.0)
Alkaline Phosphatase: 72 U/L (ref 38–126)
Anion gap: 7 (ref 5–15)
BUN: 15 mg/dL (ref 8–23)
CO2: 26 mmol/L (ref 22–32)
Calcium: 8.9 mg/dL (ref 8.9–10.3)
Chloride: 108 mmol/L (ref 98–111)
Creatinine: 1 mg/dL (ref 0.44–1.00)
GFR, Est AFR Am: >60 mL/min (ref >60)
GFR, Estimated: 55 mL/min — ABNORMAL LOW (ref >60)
Glucose, Bld: 89 mg/dL (ref 70–99)
Potassium: 4 mmol/L (ref 3.5–5.1)
Sodium: 141 mmol/L (ref 135–145)
Total Bilirubin: 0.5 mg/dL (ref 0.3–1.2)
Total Protein: 6.5 g/dL (ref 6.5–8.1)

## 2019-11-30 LAB — CBC WITH DIFFERENTIAL (CANCER CENTER ONLY)
Abs Immature Granulocytes: 0.04 10*3/uL (ref 0.00–0.07)
Basophils Absolute: 0.1 10*3/uL (ref 0.0–0.1)
Basophils Relative: 1 %
Eosinophils Absolute: 0.1 10*3/uL (ref 0.0–0.5)
Eosinophils Relative: 2 %
HCT: 42.6 % (ref 36.0–46.0)
Hemoglobin: 14.4 g/dL (ref 12.0–15.0)
Immature Granulocytes: 1 %
Lymphocytes Relative: 26 %
Lymphs Abs: 1.7 10*3/uL (ref 0.7–4.0)
MCH: 38.3 pg — ABNORMAL HIGH (ref 26.0–34.0)
MCHC: 33.8 g/dL (ref 30.0–36.0)
MCV: 113.3 fL — ABNORMAL HIGH (ref 80.0–100.0)
Monocytes Absolute: 0.6 10*3/uL (ref 0.1–1.0)
Monocytes Relative: 9 %
Neutro Abs: 4.1 10*3/uL (ref 1.7–7.7)
Neutrophils Relative %: 61 %
Platelet Count: 628 10*3/uL — ABNORMAL HIGH (ref 150–400)
RBC: 3.76 MIL/uL — ABNORMAL LOW (ref 3.87–5.11)
RDW: 14.6 % (ref 11.5–15.5)
WBC Count: 6.7 10*3/uL (ref 4.0–10.5)
nRBC: 0 % (ref 0.0–0.2)

## 2019-11-30 NOTE — Progress Notes (Signed)
Hematology and Oncology Follow Up Visit  Anna Fuller [id number] 1945/10/06 74 y.o. 11/30/2019 8:55 AM      Principle Diagnosis: 74 year old woman with JAK2 positive mutation myeloproliferative disorder presented with essential thrombocythemia in 2007.  Prior Therapy:   She is status post Anagrelide therapy discontinued due to do intolerance 2008.   Current therapy:   She started on hydroxyurea 2009.  Her current dose is 1500 mg daily starting on 08/06/2016.  Interim History: Ms. Feit returns today for a follow-up visit.  Since the last visit, she reports no major changes in her health.  She continues to be active and attends to activities of daily living.  She denies any complications related to hydroxyurea.  She denies any nausea, fatigue or recurrent thrombosis.  She denies any skin rashes or lesions.        Medications: Unchanged on review. Current Outpatient Prescriptions  Medication Sig Dispense Refill  . aspirin 81 MG tablet Take 81 mg by mouth daily.        . cholecalciferol (VITAMIN D) 1000 UNITS tablet Take 2,000 Units by mouth daily.      Marland Kitchen estrogens, conjugated, (PREMARIN) 0.3 MG tablet Take 1 tablet (0.3 mg total) by mouth daily. Take daily for 21 days then do not take for 7 days.  30 tablet  11  . hydroxyurea (HYDREA) 500 MG capsule Take one tab twice a day.  Every other night take an additional tablet at night.  (Alternate 1000mg  and 1500mg ).  120 capsule  3  . simvastatin (ZOCOR) 40 MG tablet Take 1 tablet (40 mg total) by mouth at bedtime.  30 tablet  11         Allergies: Reviewed with the patient again today. Allergies  Allergen Reactions  . Codeine Nausea And Vomiting  . Iodine Anaphylaxis    Contrast dye  . Penicillins Other (See Comments)     Physical Exam:  Blood pressure (!) 125/58, pulse 71, temperature 98.7 F (37.1 C), temperature source Temporal, resp. rate 18, height 5\' 2"  (1.575 m), weight 140 lb 1.6 oz (63.5 kg), SpO2 100  %.    ECOG: 1   General appearance: Alert, awake without any distress. Head: Atraumatic without abnormalities Oropharynx: Without any thrush or ulcers. Eyes: No scleral icterus. Lymph nodes: No lymphadenopathy noted in the cervical, supraclavicular, or axillary nodes Heart:regular rate and rhythm, without any murmurs or gallops.   Lung: Clear to auscultation without any rhonchi, wheezes or dullness to percussion. Abdomin: Soft, nontender without any shifting dullness or ascites. Musculoskeletal: No clubbing or cyanosis. Neurological: No motor or sensory deficits. Skin: No rashes or lesions.             Lab Results: Lab Results  Component Value Date   WBC 7.2 10/03/2019   HGB 15.0 10/03/2019   HCT 43.7 10/03/2019   MCV 110.4 (H) 10/03/2019   PLT 709 (H) 10/03/2019     Chemistry      Component Value Date/Time   NA 140 10/03/2019 0810   NA 140 09/28/2017 0811   K 4.4 10/03/2019 0810   K 3.8 09/28/2017 0811   CL 104 10/03/2019 0810   CL 105 01/30/2013 0825   CO2 27 10/03/2019 0810   CO2 25 09/28/2017 0811   BUN 19 10/03/2019 0810   BUN 14.6 09/28/2017 0811   CREATININE 1.00 10/03/2019 0810   CREATININE 0.9 09/28/2017 0811      Component Value Date/Time   CALCIUM 8.9 10/03/2019 0810   CALCIUM  9.1 09/28/2017 0811   ALKPHOS 96 10/03/2019 0810   ALKPHOS 75 09/28/2017 0811   AST 27 10/03/2019 0810   AST 23 09/28/2017 0811   ALT 21 10/03/2019 0810   ALT 23 09/28/2017 0811   BILITOT 0.5 10/03/2019 0810   BILITOT 0.46 09/28/2017 0811      Impression and Plan:  74 year old woman with:  1.  Myeloproliferative disorder diagnosed in 2007.  She was found to have JAK2 positive mutation and thrombocytosis.  .  She continues to be on hydroxyurea with overall disease control and no major complications.  Risks and benefits of continuing hydroxyurea at the current dose versus this medication were reviewed.  Laboratory data today showed a platelet count of 628 and  remains reasonably controlled at this time.  She is agreeable with this plan.     2.  Thrombosis prophylaxis: Continues to be on aspirin with no recent thrombosis.  3.  Constitutional symptoms: We continue to educate her about potential constitutional symptoms associated with this condition.  These include arthralgias, myalgias and pruritus.   4.  Age-appropriate cancer screening: I continue to encourage her to follow-up regarding her age-appropriate cancer screening and she is up-to-date.    5.  Follow-up: She will return in 2 months for a follow-up.  30   minutes were dedicated to this visit. The time was spent on reviewing laboratory data, discussing treatment options, and addressing complications related to therapy.      Zola Button, MD 3/5/20218:55 AM`

## 2019-11-30 NOTE — Telephone Encounter (Signed)
Scheduled per los. Patient declined printout  

## 2020-01-29 ENCOUNTER — Inpatient Hospital Stay: Payer: Medicare Other | Attending: Oncology | Admitting: Oncology

## 2020-01-29 ENCOUNTER — Other Ambulatory Visit: Payer: Self-pay

## 2020-01-29 ENCOUNTER — Telehealth: Payer: Self-pay | Admitting: Oncology

## 2020-01-29 ENCOUNTER — Inpatient Hospital Stay: Payer: Medicare Other

## 2020-01-29 VITALS — BP 125/51 | HR 72 | Temp 97.8°F | Resp 18 | Ht 62.0 in | Wt 138.4 lb

## 2020-01-29 DIAGNOSIS — L299 Pruritus, unspecified: Secondary | ICD-10-CM | POA: Insufficient documentation

## 2020-01-29 DIAGNOSIS — Z885 Allergy status to narcotic agent status: Secondary | ICD-10-CM | POA: Insufficient documentation

## 2020-01-29 DIAGNOSIS — Z888 Allergy status to other drugs, medicaments and biological substances status: Secondary | ICD-10-CM | POA: Diagnosis not present

## 2020-01-29 DIAGNOSIS — J984 Other disorders of lung: Secondary | ICD-10-CM | POA: Insufficient documentation

## 2020-01-29 DIAGNOSIS — Z880 Allergy status to penicillin: Secondary | ICD-10-CM | POA: Insufficient documentation

## 2020-01-29 DIAGNOSIS — D473 Essential (hemorrhagic) thrombocythemia: Secondary | ICD-10-CM | POA: Insufficient documentation

## 2020-01-29 DIAGNOSIS — R61 Generalized hyperhidrosis: Secondary | ICD-10-CM | POA: Diagnosis not present

## 2020-01-29 DIAGNOSIS — Z79899 Other long term (current) drug therapy: Secondary | ICD-10-CM | POA: Insufficient documentation

## 2020-01-29 DIAGNOSIS — R5383 Other fatigue: Secondary | ICD-10-CM | POA: Insufficient documentation

## 2020-01-29 LAB — CBC WITH DIFFERENTIAL (CANCER CENTER ONLY)
Abs Immature Granulocytes: 0.03 10*3/uL (ref 0.00–0.07)
Basophils Absolute: 0.1 10*3/uL (ref 0.0–0.1)
Basophils Relative: 1 %
Eosinophils Absolute: 0.1 10*3/uL (ref 0.0–0.5)
Eosinophils Relative: 1 %
HCT: 45.7 % (ref 36.0–46.0)
Hemoglobin: 15.2 g/dL — ABNORMAL HIGH (ref 12.0–15.0)
Immature Granulocytes: 0 %
Lymphocytes Relative: 23 %
Lymphs Abs: 1.8 10*3/uL (ref 0.7–4.0)
MCH: 37.5 pg — ABNORMAL HIGH (ref 26.0–34.0)
MCHC: 33.3 g/dL (ref 30.0–36.0)
MCV: 112.8 fL — ABNORMAL HIGH (ref 80.0–100.0)
Monocytes Absolute: 0.8 10*3/uL (ref 0.1–1.0)
Monocytes Relative: 10 %
Neutro Abs: 5.2 10*3/uL (ref 1.7–7.7)
Neutrophils Relative %: 65 %
Platelet Count: 842 10*3/uL — ABNORMAL HIGH (ref 150–400)
RBC: 4.05 MIL/uL (ref 3.87–5.11)
RDW: 14.8 % (ref 11.5–15.5)
WBC Count: 7.9 10*3/uL (ref 4.0–10.5)
nRBC: 0 % (ref 0.0–0.2)

## 2020-01-29 LAB — CMP (CANCER CENTER ONLY)
ALT: 16 U/L (ref 0–44)
AST: 21 U/L (ref 15–41)
Albumin: 4.1 g/dL (ref 3.5–5.0)
Alkaline Phosphatase: 80 U/L (ref 38–126)
Anion gap: 10 (ref 5–15)
BUN: 11 mg/dL (ref 8–23)
CO2: 24 mmol/L (ref 22–32)
Calcium: 9.1 mg/dL (ref 8.9–10.3)
Chloride: 107 mmol/L (ref 98–111)
Creatinine: 1.05 mg/dL — ABNORMAL HIGH (ref 0.44–1.00)
GFR, Est AFR Am: >60 mL/min (ref >60)
GFR, Estimated: 52 mL/min — ABNORMAL LOW (ref >60)
Glucose, Bld: 95 mg/dL (ref 70–99)
Potassium: 4 mmol/L (ref 3.5–5.1)
Sodium: 141 mmol/L (ref 135–145)
Total Bilirubin: 0.5 mg/dL (ref 0.3–1.2)
Total Protein: 6.7 g/dL (ref 6.5–8.1)

## 2020-01-29 MED ORDER — ONDANSETRON HCL 4 MG PO TABS: 4.0000 mg | ORAL_TABLET | Freq: Three times a day (TID) | ORAL | 3 refills | Status: DC | PRN

## 2020-01-29 NOTE — Progress Notes (Signed)
Hematology and Oncology Follow Up Visit  Anna Fuller [id number] [birth year] 74 y.o. 01/29/2020 8:49 AM      Principle Diagnosis: 74 year old woman with essential thrombocythemia diagnosed in 2007.  She was found to have JAK2 positive mutation myeloproliferative disorder at that time.  Prior Therapy:   She is status post Anagrelide therapy discontinued due to do intolerance 2008.   Current therapy:   She started on hydroxyurea 2009.  Her current dose is 1500 mg daily starting on 08/06/2016.  Interim History: Anna Fuller returns today for a repeat lesion.  Since the last visit, she reports no major changes in her health.  She denies any nausea, vomiting or abdominal pain.  She denies excessive fatigue or tiredness.  She continues to tolerate her without any changes formal status and quality of life.  She does report occasional fatigue and pruritus.  She does report some scalp irritation which is unchanged.  She denies any bleeding or thrombosis episodes.        Medications: Updated on review. Current Outpatient Prescriptions  Medication Sig Dispense Refill  . aspirin 81 MG tablet Take 81 mg by mouth daily.        . cholecalciferol (VITAMIN D) 1000 UNITS tablet Take 2,000 Units by mouth daily.      Marland Kitchen estrogens, conjugated, (PREMARIN) 0.3 MG tablet Take 1 tablet (0.3 mg total) by mouth daily. Take daily for 21 days then do not take for 7 days.  30 tablet  11  . hydroxyurea (HYDREA) 500 MG capsule Take one tab twice a day.  Every other night take an additional tablet at night.  (Alternate 1000mg  and 1500mg ).  120 capsule  3  . simvastatin (ZOCOR) 40 MG tablet Take 1 tablet (40 mg total) by mouth at bedtime.  30 tablet  11         Allergies: Reviewed with the patient again today. Allergies  Allergen Reactions  . Codeine Nausea And Vomiting  . Iodine Anaphylaxis    Contrast dye  . Penicillins Other (See Comments)     Physical Exam:   Blood pressure (!) 125/51, pulse  72, temperature 97.8 F (36.6 C), temperature source Temporal, resp. rate 18, height 5\' 2"  (1.575 m), weight 138 lb 6.4 oz (62.8 kg), SpO2 98 %.   ECOG: 1     General appearance: Comfortable appearing without any discomfort Head: Normocephalic without any trauma Oropharynx: Mucous membranes are moist and pink without any thrush or ulcers. Eyes: Pupils are equal and round reactive to light. Lymph nodes: No cervical, supraclavicular, inguinal or axillary lymphadenopathy.   Heart:regular rate and rhythm.  S1 and S2 without leg edema. Lung: Clear without any rhonchi or wheezes.  No dullness to percussion. Abdomin: Soft, nontender, nondistended with good bowel sounds.  No hepatosplenomegaly. Musculoskeletal: No joint deformity or effusion.  Full range of motion noted. Neurological: No deficits noted on motor, sensory and deep tendon reflex exam. Skin: No petechial rash or dryness.  Appeared moist.               Lab Results: Lab Results  Component Value Date   WBC 6.7 11/30/2019   HGB 14.4 11/30/2019   HCT 42.6 11/30/2019   MCV 113.3 (H) 11/30/2019   PLT 628 (H) 11/30/2019     Chemistry      Component Value Date/Time   NA 141 11/30/2019 0844   NA 140 09/28/2017 0811   K 4.0 11/30/2019 0844   K 3.8 09/28/2017 0811   CL 108  11/30/2019 0844   CL 105 01/30/2013 0825   CO2 26 11/30/2019 0844   CO2 25 09/28/2017 0811   BUN 15 11/30/2019 0844   BUN 14.6 09/28/2017 0811   CREATININE 1.00 11/30/2019 0844   CREATININE 0.9 09/28/2017 0811      Component Value Date/Time   CALCIUM 8.9 11/30/2019 0844   CALCIUM 9.1 09/28/2017 0811   ALKPHOS 72 11/30/2019 0844   ALKPHOS 75 09/28/2017 0811   AST 20 11/30/2019 0844   AST 23 09/28/2017 0811   ALT 14 11/30/2019 0844   ALT 23 09/28/2017 0811   BILITOT 0.5 11/30/2019 0844   BILITOT 0.46 09/28/2017 0811      Impression and Plan:  74 year old woman with:  1.  Essential thrombocythemia presented with elevated platelets  since 2007.  She has JAK2 positive mutation.   She is currently on hydroxyurea with excellent control of her counts and reasonable tolerance.  Risks and benefits of continuing hydroxyurea long-term were reviewed.  Complications that include myelosuppression, pulmonary disease, other complications were reiterated.  Alternative options including anagrelide interferon are not preferable at this time.  Laboratory data reviewed today and show an increase in her platelet count although it has been fluctuating for the last 6 months.  For the time being I recommended no changes in her hydroxyurea dose and continued monitoring.    2.  Thrombosis prophylaxis: No recent bleeding or thrombosis noted at this time.  Continues to be on aspirin.  3.  Constitutional symptoms: He is experiencing minor constitutional symptoms but manageable at this time.  He has been include night sweats and pruritus.   4.  Age-appropriate cancer screening: He is up-to-date at this time.    5.  Follow-up: In 8 weeks for a repeat evaluation.  30   minutes were spent on this encounter.  The time was dedicated to reviewing laboratory data, updating her disease status, discussing treatment alternative.     Zola Button, MD 5/4/20218:49 AM`

## 2020-01-29 NOTE — Telephone Encounter (Signed)
Scheduled per los. Patient declined printout  

## 2020-03-10 DIAGNOSIS — H5213 Myopia, bilateral: Secondary | ICD-10-CM | POA: Diagnosis not present

## 2020-03-10 DIAGNOSIS — H353131 Nonexudative age-related macular degeneration, bilateral, early dry stage: Secondary | ICD-10-CM | POA: Diagnosis not present

## 2020-03-27 ENCOUNTER — Inpatient Hospital Stay: Payer: Medicare Other

## 2020-03-27 ENCOUNTER — Other Ambulatory Visit: Payer: Self-pay

## 2020-03-27 ENCOUNTER — Telehealth: Payer: Self-pay | Admitting: Oncology

## 2020-03-27 ENCOUNTER — Inpatient Hospital Stay: Payer: Medicare Other | Attending: Oncology | Admitting: Oncology

## 2020-03-27 VITALS — BP 124/56 | HR 65 | Temp 97.8°F | Resp 18 | Ht 62.0 in | Wt 138.8 lb

## 2020-03-27 DIAGNOSIS — Z888 Allergy status to other drugs, medicaments and biological substances status: Secondary | ICD-10-CM | POA: Diagnosis not present

## 2020-03-27 DIAGNOSIS — D473 Essential (hemorrhagic) thrombocythemia: Secondary | ICD-10-CM | POA: Insufficient documentation

## 2020-03-27 DIAGNOSIS — Z79899 Other long term (current) drug therapy: Secondary | ICD-10-CM | POA: Diagnosis not present

## 2020-03-27 DIAGNOSIS — R200 Anesthesia of skin: Secondary | ICD-10-CM | POA: Diagnosis not present

## 2020-03-27 DIAGNOSIS — Z885 Allergy status to narcotic agent status: Secondary | ICD-10-CM | POA: Insufficient documentation

## 2020-03-27 DIAGNOSIS — R5383 Other fatigue: Secondary | ICD-10-CM | POA: Insufficient documentation

## 2020-03-27 DIAGNOSIS — Z880 Allergy status to penicillin: Secondary | ICD-10-CM | POA: Diagnosis not present

## 2020-03-27 LAB — CMP (CANCER CENTER ONLY)
ALT: 18 U/L (ref 0–44)
AST: 24 U/L (ref 15–41)
Albumin: 4 g/dL (ref 3.5–5.0)
Alkaline Phosphatase: 81 U/L (ref 38–126)
Anion gap: 12 (ref 5–15)
BUN: 12 mg/dL (ref 8–23)
CO2: 22 mmol/L (ref 22–32)
Calcium: 9.3 mg/dL (ref 8.9–10.3)
Chloride: 107 mmol/L (ref 98–111)
Creatinine: 1.08 mg/dL — ABNORMAL HIGH (ref 0.44–1.00)
GFR, Est AFR Am: 58 mL/min — ABNORMAL LOW (ref >60)
GFR, Estimated: 50 mL/min — ABNORMAL LOW (ref >60)
Glucose, Bld: 97 mg/dL (ref 70–99)
Potassium: 4.5 mmol/L (ref 3.5–5.1)
Sodium: 141 mmol/L (ref 135–145)
Total Bilirubin: 0.6 mg/dL (ref 0.3–1.2)
Total Protein: 6.8 g/dL (ref 6.5–8.1)

## 2020-03-27 LAB — CBC WITH DIFFERENTIAL (CANCER CENTER ONLY)
Abs Immature Granulocytes: 0.04 10*3/uL (ref 0.00–0.07)
Basophils Absolute: 0.1 10*3/uL (ref 0.0–0.1)
Basophils Relative: 1 %
Eosinophils Absolute: 0.2 10*3/uL (ref 0.0–0.5)
Eosinophils Relative: 2 %
HCT: 46 % (ref 36.0–46.0)
Hemoglobin: 15.7 g/dL — ABNORMAL HIGH (ref 12.0–15.0)
Immature Granulocytes: 0 %
Lymphocytes Relative: 25 %
Lymphs Abs: 2.2 10*3/uL (ref 0.7–4.0)
MCH: 37.6 pg — ABNORMAL HIGH (ref 26.0–34.0)
MCHC: 34.1 g/dL (ref 30.0–36.0)
MCV: 110.3 fL — ABNORMAL HIGH (ref 80.0–100.0)
Monocytes Absolute: 0.8 10*3/uL (ref 0.1–1.0)
Monocytes Relative: 9 %
Neutro Abs: 5.6 10*3/uL (ref 1.7–7.7)
Neutrophils Relative %: 63 %
Platelet Count: 723 10*3/uL — ABNORMAL HIGH (ref 150–400)
RBC: 4.17 MIL/uL (ref 3.87–5.11)
RDW: 14.9 % (ref 11.5–15.5)
WBC Count: 8.9 10*3/uL (ref 4.0–10.5)
nRBC: 0 % (ref 0.0–0.2)

## 2020-03-27 NOTE — Telephone Encounter (Signed)
Scheduled per 7/1 los. Pt is aware of appt times and date. No avs or calendar needed to be printed

## 2020-03-27 NOTE — Progress Notes (Signed)
Hematology and Oncology Follow Up Visit  Anna Fuller [id number] 06-03-1946 74 y.o. 03/27/2020 8:01 AM      Principle Diagnosis: 74 year old woman with JAK2 positive essential thrombocythemia diagnosed in 2007.    Prior Therapy:   She is status post Anagrelide therapy discontinued due to do intolerance 2008.   Current therapy:   She started on hydroxyurea 2009.  Her current dose is 1500 mg daily starting on 08/06/2016.  Interim History: Anna Fuller is here for a follow-up visit.  Since the last visit, he reports no major changes in her health.  He denies any recent hospitalization or illnesses.  She denies any bleeding or clotting tendencies.  She does report a scalp numbness which is unchanged.  He does report some mild fatigue and tiredness at this time.  Performance status and quality of life remain excellent he continues to live independently.        Medications: Reviewed without changes. Current Outpatient Prescriptions  Medication Sig Dispense Refill  . aspirin 81 MG tablet Take 81 mg by mouth daily.        . cholecalciferol (VITAMIN D) 1000 UNITS tablet Take 2,000 Units by mouth daily.      Marland Kitchen estrogens, conjugated, (PREMARIN) 0.3 MG tablet Take 1 tablet (0.3 mg total) by mouth daily. Take daily for 21 days then do not take for 7 days.  30 tablet  11  . hydroxyurea (HYDREA) 500 MG capsule Take one tab twice a day.  Every other night take an additional tablet at night.  (Alternate 1000mg  and 1500mg ).  120 capsule  3  . simvastatin (ZOCOR) 40 MG tablet Take 1 tablet (40 mg total) by mouth at bedtime.  30 tablet  11         Allergies: Reviewed with the patient again today. Allergies  Allergen Reactions  . Codeine Nausea And Vomiting  . Iodine Anaphylaxis    Contrast dye  . Penicillins Other (See Comments)     Physical Exam:  Blood pressure (!) 124/56, pulse 65, temperature 97.8 F (36.6 C), temperature source Temporal, resp. rate 18, height 5\' 2"  (1.575 m),  weight 138 lb 12.8 oz (63 kg), SpO2 98 %.     ECOG: 1    General appearance: Alert, awake without any distress. Head: Atraumatic without abnormalities Oropharynx: Without any thrush or ulcers. Eyes: No scleral icterus. Lymph nodes: No lymphadenopathy noted in the cervical, supraclavicular, or axillary nodes Heart:regular rate and rhythm, without any murmurs or gallops.   Lung: Clear to auscultation without any rhonchi, wheezes or dullness to percussion. Abdomin: Soft, nontender without any shifting dullness or ascites. Musculoskeletal: No clubbing or cyanosis. Neurological: No motor or sensory deficits. Skin: No rashes or lesions.              Lab Results: Lab Results  Component Value Date   WBC 7.9 01/29/2020   HGB 15.2 (H) 01/29/2020   HCT 45.7 01/29/2020   MCV 112.8 (H) 01/29/2020   PLT 842 (H) 01/29/2020     Chemistry      Component Value Date/Time   NA 141 01/29/2020 0841   NA 140 09/28/2017 0811   K 4.0 01/29/2020 0841   K 3.8 09/28/2017 0811   CL 107 01/29/2020 0841   CL 105 01/30/2013 0825   CO2 24 01/29/2020 0841   CO2 25 09/28/2017 0811   BUN 11 01/29/2020 0841   BUN 14.6 09/28/2017 0811   CREATININE 1.05 (H) 01/29/2020 0841   CREATININE 0.9 09/28/2017 8264  Component Value Date/Time   CALCIUM 9.1 01/29/2020 0841   CALCIUM 9.1 09/28/2017 0811   ALKPHOS 80 01/29/2020 0841   ALKPHOS 75 09/28/2017 0811   AST 21 01/29/2020 0841   AST 23 09/28/2017 0811   ALT 16 01/29/2020 0841   ALT 23 09/28/2017 0811   BILITOT 0.5 01/29/2020 0841   BILITOT 0.46 09/28/2017 0811         Impression and Plan:  74 year old woman with:  1.  Myeloproliferative disorder diagnosed in 2007.  She was found to have essential thrombocythemia with JAK2 positive mutation.  She remains on hydroxyurea without any major changes or complaints.  She continues to encounter some side effects including fatigue and scalp numbness.  CBC from today reviewed and showed  platelet count of 723 remains under reasonable control.  Risks and benefits of continuing the same dose of hydroxyurea versus dose adjustment were reviewed.  She is agreeable to continue the same dose and schedule.    2.  Thrombosis prophylaxis: She is currently on aspirin without any bleeding complications.  3.  Constitutional symptoms: No major changes in her minor constitutional symptoms.  I do not recommend adding JAK2 inhibitors for the time being.   4.  Age-appropriate health screening: Continues to be up-to-date at this time including Covid vaccination.   5.  Follow-up: She will return in 2 months for a follow-up visit.  30   minutes were dedicated to this visit.  The time was spent on reviewing laboratory data, discussing treatment options and addressing complications related to her disease and treatment.     Zola Button, MD 7/1/20218:01 AM`

## 2020-05-27 ENCOUNTER — Inpatient Hospital Stay: Payer: Medicare Other | Attending: Oncology | Admitting: Oncology

## 2020-05-27 ENCOUNTER — Telehealth: Payer: Self-pay | Admitting: Oncology

## 2020-05-27 ENCOUNTER — Inpatient Hospital Stay: Payer: Medicare Other

## 2020-05-27 ENCOUNTER — Other Ambulatory Visit: Payer: Self-pay

## 2020-05-27 VITALS — BP 132/58 | HR 72 | Temp 97.9°F | Resp 18 | Ht 62.0 in | Wt 139.8 lb

## 2020-05-27 DIAGNOSIS — Z7982 Long term (current) use of aspirin: Secondary | ICD-10-CM | POA: Diagnosis not present

## 2020-05-27 DIAGNOSIS — Z888 Allergy status to other drugs, medicaments and biological substances status: Secondary | ICD-10-CM | POA: Insufficient documentation

## 2020-05-27 DIAGNOSIS — Z880 Allergy status to penicillin: Secondary | ICD-10-CM | POA: Diagnosis not present

## 2020-05-27 DIAGNOSIS — D473 Essential (hemorrhagic) thrombocythemia: Secondary | ICD-10-CM

## 2020-05-27 DIAGNOSIS — Z79899 Other long term (current) drug therapy: Secondary | ICD-10-CM | POA: Insufficient documentation

## 2020-05-27 DIAGNOSIS — Z885 Allergy status to narcotic agent status: Secondary | ICD-10-CM | POA: Insufficient documentation

## 2020-05-27 DIAGNOSIS — C946 Myelodysplastic disease, not classified: Secondary | ICD-10-CM | POA: Insufficient documentation

## 2020-05-27 DIAGNOSIS — G5790 Unspecified mononeuropathy of unspecified lower limb: Secondary | ICD-10-CM | POA: Diagnosis not present

## 2020-05-27 LAB — CMP (CANCER CENTER ONLY)
ALT: 12 U/L (ref 0–44)
AST: 20 U/L (ref 15–41)
Albumin: 3.9 g/dL (ref 3.5–5.0)
Alkaline Phosphatase: 83 U/L (ref 38–126)
Anion gap: 8 (ref 5–15)
BUN: 10 mg/dL (ref 8–23)
CO2: 24 mmol/L (ref 22–32)
Calcium: 9.9 mg/dL (ref 8.9–10.3)
Chloride: 106 mmol/L (ref 98–111)
Creatinine: 1.02 mg/dL — ABNORMAL HIGH (ref 0.44–1.00)
GFR, Est AFR Am: >60 mL/min (ref >60)
GFR, Estimated: 54 mL/min — ABNORMAL LOW (ref >60)
Glucose, Bld: 89 mg/dL (ref 70–99)
Potassium: 4 mmol/L (ref 3.5–5.1)
Sodium: 138 mmol/L (ref 135–145)
Total Bilirubin: 0.6 mg/dL (ref 0.3–1.2)
Total Protein: 6.7 g/dL (ref 6.5–8.1)

## 2020-05-27 LAB — CBC WITH DIFFERENTIAL (CANCER CENTER ONLY)
Abs Immature Granulocytes: 0.04 10*3/uL (ref 0.00–0.07)
Basophils Absolute: 0.1 10*3/uL (ref 0.0–0.1)
Basophils Relative: 1 %
Eosinophils Absolute: 0.1 10*3/uL (ref 0.0–0.5)
Eosinophils Relative: 2 %
HCT: 44.6 % (ref 36.0–46.0)
Hemoglobin: 15.5 g/dL — ABNORMAL HIGH (ref 12.0–15.0)
Immature Granulocytes: 1 %
Lymphocytes Relative: 26 %
Lymphs Abs: 2 10*3/uL (ref 0.7–4.0)
MCH: 38 pg — ABNORMAL HIGH (ref 26.0–34.0)
MCHC: 34.8 g/dL (ref 30.0–36.0)
MCV: 109.3 fL — ABNORMAL HIGH (ref 80.0–100.0)
Monocytes Absolute: 0.7 10*3/uL (ref 0.1–1.0)
Monocytes Relative: 9 %
Neutro Abs: 4.7 10*3/uL (ref 1.7–7.7)
Neutrophils Relative %: 61 %
Platelet Count: 751 10*3/uL — ABNORMAL HIGH (ref 150–400)
RBC: 4.08 MIL/uL (ref 3.87–5.11)
RDW: 15.3 % (ref 11.5–15.5)
WBC Count: 7.6 10*3/uL (ref 4.0–10.5)
nRBC: 0 % (ref 0.0–0.2)

## 2020-05-27 NOTE — Progress Notes (Signed)
Hematology and Oncology Follow Up Visit  Anna Fuller [id number] 1945/10/16 74 y.o. 05/27/2020 7:46 AM      Principle Diagnosis: 74 year old woman with essential thrombocythemia diagnosed in 2007.  She was found to have JAK2 positive mutation.  Prior Therapy:   She is status post Anagrelide therapy discontinued due to do intolerance 2008.   Current therapy:   She started on hydroxyurea 2009.  Her current dose is 1500 mg daily starting on 08/06/2016.  Interim History: Anna Fuller is here for repeat evaluation.  Since the last visit, she reports no major changes in her health.  She has been reporting numbness and a sensation on her left foot and ankle.  She is describing a sensation of more sensory rather than motor neuropathy.  She is able to ambulate without any difficulties.  She denied any increased pain or discomfort.  She denied any falls or syncope.        Medications: Updated on review. Current Outpatient Prescriptions  Medication Sig Dispense Refill  . aspirin 81 MG tablet Take 81 mg by mouth daily.        . cholecalciferol (VITAMIN D) 1000 UNITS tablet Take 2,000 Units by mouth daily.      Marland Kitchen estrogens, conjugated, (PREMARIN) 0.3 MG tablet Take 1 tablet (0.3 mg total) by mouth daily. Take daily for 21 days then do not take for 7 days.  30 tablet  11  . hydroxyurea (HYDREA) 500 MG capsule Take one tab twice a day.  Every other night take an additional tablet at night.  (Alternate 1000mg  and 1500mg ).  120 capsule  3  . simvastatin (ZOCOR) 40 MG tablet Take 1 tablet (40 mg total) by mouth at bedtime.  30 tablet  11         Allergies: Reviewed with the patient again today. Allergies  Allergen Reactions  . Codeine Nausea And Vomiting  . Iodine Anaphylaxis    Contrast dye  . Penicillins Other (See Comments)     Physical Exam:  Blood pressure (!) 132/58, pulse 72, temperature 97.9 F (36.6 C), temperature source Tympanic, resp. rate 18, height 5\' 2"  (1.575  m), weight 139 lb 12.8 oz (63.4 kg), SpO2 98 %.      ECOG: 1   General appearance: Comfortable appearing without any discomfort Head: Normocephalic without any trauma Oropharynx: Mucous membranes are moist and pink without any thrush or ulcers. Eyes: Pupils are equal and round reactive to light. Lymph nodes: No cervical, supraclavicular, inguinal or axillary lymphadenopathy.   Heart:regular rate and rhythm.  S1 and S2 without leg edema. Lung: Clear without any rhonchi or wheezes.  No dullness to percussion. Abdomin: Soft, nontender, nondistended with good bowel sounds.  No hepatosplenomegaly. Musculoskeletal: No joint deformity or effusion.  Full range of motion noted. Neurological: No deficits noted on motor, sensory and deep tendon reflex exam. Skin: No petechial rash or dryness.  Appeared moist.                Lab Results: Lab Results  Component Value Date   WBC 8.9 03/27/2020   HGB 15.7 (H) 03/27/2020   HCT 46.0 03/27/2020   MCV 110.3 (H) 03/27/2020   PLT 723 (H) 03/27/2020     Chemistry      Component Value Date/Time   NA 141 03/27/2020 0748   NA 140 09/28/2017 0811   K 4.5 03/27/2020 0748   K 3.8 09/28/2017 0811   CL 107 03/27/2020 0748   CL 105 01/30/2013 0825  CO2 22 03/27/2020 0748   CO2 25 09/28/2017 0811   BUN 12 03/27/2020 0748   BUN 14.6 09/28/2017 0811   CREATININE 1.08 (H) 03/27/2020 0748   CREATININE 0.9 09/28/2017 0811      Component Value Date/Time   CALCIUM 9.3 03/27/2020 0748   CALCIUM 9.1 09/28/2017 0811   ALKPHOS 81 03/27/2020 0748   ALKPHOS 75 09/28/2017 0811   AST 24 03/27/2020 0748   AST 23 09/28/2017 0811   ALT 18 03/27/2020 0748   ALT 23 09/28/2017 0811   BILITOT 0.6 03/27/2020 0748   BILITOT 0.46 09/28/2017 0811         Impression and Plan:  74 year old woman with:  1.  JAK2 positive myeloproliferative disorder presented with essential thrombocythemia in 2007.    The natural course of her disease were  reviewed today.  Risks and benefits of continuing this treatments versus alternative treatment options were discussed.  Her counts has been controlled with hydroxyurea without any major complications.  She does report side effects which have not dramatically changed and remains overall tolerable.  Long-term issues associated with hydroxyurea including bone marrow disease and complications were reiterated.  Laboratory data reviewed and showed a platelet count overall stable.  No adjustments in hydroxyurea will be needed.    2.  Thrombosis prophylaxis: Risk of thrombosis remains low at this time.  She is currently on aspirin.  3.  Constitutional symptoms: She does have few issues related to her myeloproliferative disorder including body aches and sweats at times but remains manageable.   4.  Age-appropriate health screening: He is up-to-date.  Covid vaccination booster scheduled in the near future.  5.  Lower extremity neuropathy: Appears to be asymmetrical to the left more than right with more sensory than motor in nature.  Differential diagnosis was discussed today which include neuropathy related to constitutional symptoms and myeloproliferative disorder versus complications related to hydroxyurea.  Other neurological conditions are also considerations.  I recommended that neurology consultation of the symptoms persist or worsen.  She will consider that and discuss it with Dr. Renold Genta with her next visit.   6.  Follow-up: In 8 weeks for repeat follow-up.  30   minutes were spent on this encounter.  Time was dedicated to reviewing laboratory data, discussing disease status, treatment options and complications of therapy.     Zola Button, MD 8/31/20217:46 AM`

## 2020-05-27 NOTE — Telephone Encounter (Signed)
Scheduled per 8/31 los. No avs or calendar needed to be printed. Pt is aware of appt time and date.

## 2020-05-30 DIAGNOSIS — Z23 Encounter for immunization: Secondary | ICD-10-CM | POA: Diagnosis not present

## 2020-06-16 ENCOUNTER — Encounter: Payer: Self-pay | Admitting: Internal Medicine

## 2020-06-16 ENCOUNTER — Other Ambulatory Visit: Payer: Self-pay

## 2020-06-16 ENCOUNTER — Ambulatory Visit (INDEPENDENT_AMBULATORY_CARE_PROVIDER_SITE_OTHER): Payer: Medicare Other | Admitting: Internal Medicine

## 2020-06-16 VITALS — BP 110/70 | HR 72 | Temp 97.9°F | Ht 62.0 in | Wt 142.0 lb

## 2020-06-16 DIAGNOSIS — Z23 Encounter for immunization: Secondary | ICD-10-CM

## 2020-06-16 NOTE — Patient Instructions (Signed)
Patient received a flu vaccine IM R deltoid, AV, CMA

## 2020-06-16 NOTE — Progress Notes (Signed)
Flu vaccine given by CMA 

## 2020-07-11 ENCOUNTER — Ambulatory Visit (INDEPENDENT_AMBULATORY_CARE_PROVIDER_SITE_OTHER): Payer: Medicare Other | Admitting: Internal Medicine

## 2020-07-11 ENCOUNTER — Encounter: Payer: Self-pay | Admitting: Internal Medicine

## 2020-07-11 ENCOUNTER — Other Ambulatory Visit: Payer: Self-pay

## 2020-07-11 VITALS — BP 100/60 | HR 72 | Temp 97.9°F | Ht 62.0 in | Wt 141.0 lb

## 2020-07-11 DIAGNOSIS — R202 Paresthesia of skin: Secondary | ICD-10-CM

## 2020-07-11 NOTE — Progress Notes (Signed)
   Subjective:    Patient ID: Anna Fuller, female    DOB: 07-04-1946, 74 y.o.   MRN: [id number]  HPI  74 year old Female with paresthesias throughout her body.  She has a history of essential thrombocytosis and is treated by Dr. Alen Blew with Hydrea.  Initially had scalp paresthesias but now this has extended throughout most of her body including extremities.  B12, folate, TSH and sed rate checked today all of which are within normal limits.  Paresthesias are not listed as a side effect from Hydrea.  Patient is concerned about this.  Essential thrombocytosis was diagnosed in 2007.  For past medical history seen Medicare annual wellness physical February 2021.  She also takes Zocor 40 mg daily vitamin D supplement aspirin 81 mg daily and as needed Zofran.  Received third maternal Covid vaccine May 30, 2020.  Had flu vaccine June 16, 2020.  Social history: She is single and retired.  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  Completed 2 years of college.  Does not smoke or consume alcohol.  Her sister, Nadja Lina is also a patient here and lives in [place].  Family history: Father died at age 20 with complications of diabetes.  Mother died at age 5 of pulmonary fibrosis.  Had colonoscopy done 2019.  She is allergic to contrast dye, cortisone and penicillin.  Complaining of paresthesias in her feet also.  Review of Systems scalp feels numb. Feet are numb. Has had 3 foot surgeries.     Objective:   Physical Exam Skin warm and dry.  No cervical adenopathy.  Head is normocephalic atraumatic.  Extraocular movements are full.  PERRLA.  Cranial nerves II through XII are grossly intact.  Muscle strength in upper and lower extremities is within normal limits.  No rashes or lesions on skin.  Deep tendon reflexes 2+ and symmetrical in the upper and lower extremities.  Muscle strength is within normal limits.  She is  alert and oriented x3 and affect is normal.       Assessment & Plan:  Unexplained paresthesias including scalp and most of body.  These are fairly new onset.  Had check B12 folate TSH and sed rate today all of which are within normal limits.  Paresthesias not listed as a side effect of Hydrea.  I am referring her to Neurology for further evaluation.  I have ordered an MRI of the brain as she is concerned about CNS abnormality.  Time spent reviewing records, medical decision making and seeing patient is 30 minutes.

## 2020-07-12 LAB — COMPLETE METABOLIC PANEL WITH GFR
AG Ratio: 2 (calc) (ref 1.0–2.5)
ALT: 14 U/L (ref 6–29)
AST: 21 U/L (ref 10–35)
Albumin: 4.5 g/dL (ref 3.6–5.1)
Alkaline phosphatase (APISO): 79 U/L (ref 37–153)
BUN/Creatinine Ratio: 10 (calc) (ref 6–22)
BUN: 11 mg/dL (ref 7–25)
CO2: 24 mmol/L (ref 20–32)
Calcium: 9.5 mg/dL (ref 8.6–10.4)
Chloride: 106 mmol/L (ref 98–110)
Creat: 1.05 mg/dL — ABNORMAL HIGH (ref 0.60–0.93)
GFR, Est African American: 60 mL/min/{1.73_m2} (ref >=60)
GFR, Est Non African American: 52 mL/min/{1.73_m2} — ABNORMAL LOW (ref >=60)
Globulin: 2.3 g/dL (calc) (ref 1.9–3.7)
Glucose, Bld: 87 mg/dL (ref 65–99)
Potassium: 5.2 mmol/L (ref 3.5–5.3)
Sodium: 142 mmol/L (ref 135–146)
Total Bilirubin: 0.6 mg/dL (ref 0.2–1.2)
Total Protein: 6.8 g/dL (ref 6.1–8.1)

## 2020-07-12 LAB — CBC WITH DIFFERENTIAL/PLATELET
Absolute Monocytes: 599 cells/uL (ref 200–950)
Basophils Absolute: 97 cells/uL (ref 0–200)
Basophils Relative: 1.2 %
Eosinophils Absolute: 73 cells/uL (ref 15–500)
Eosinophils Relative: 0.9 %
HCT: 44.8 % (ref 35.0–45.0)
Hemoglobin: 15.7 g/dL — ABNORMAL HIGH (ref 11.7–15.5)
Lymphs Abs: 1936 cells/uL (ref 850–3900)
MCH: 38.2 pg — ABNORMAL HIGH (ref 27.0–33.0)
MCHC: 35 g/dL (ref 32.0–36.0)
MCV: 109 fL — ABNORMAL HIGH (ref 80.0–100.0)
MPV: 8.9 fL (ref 7.5–12.5)
Monocytes Relative: 7.4 %
Neutro Abs: 5395 cells/uL (ref 1500–7800)
Neutrophils Relative %: 66.6 %
Platelets: 919 10*3/uL — ABNORMAL HIGH (ref 140–400)
RBC: 4.11 10*6/uL (ref 3.80–5.10)
RDW: 14 % (ref 11.0–15.0)
Total Lymphocyte: 23.9 %
WBC: 8.1 10*3/uL (ref 3.8–10.8)

## 2020-07-12 LAB — FOLATE: Folate: 19.7 ng/mL

## 2020-07-12 LAB — TSH: TSH: 2.23 mIU/L (ref 0.40–4.50)

## 2020-07-12 LAB — SEDIMENTATION RATE: Sed Rate: 2 mm/h (ref 0–30)

## 2020-07-12 LAB — CK, TOTAL(REFL): Total CK: 67 U/L (ref 29–143)

## 2020-07-12 LAB — VITAMIN B12: Vitamin B-12: 202 pg/mL (ref 200–1100)

## 2020-07-12 NOTE — Patient Instructions (Addendum)
It was a pleasure to see you today.  We have ordered B12 level, folate level and TSH as well as sedimentation rate.  All of these labs are within normal limits.  MRI of the brain has been ordered as well.  Patient will be referred to neurologist for evaluation.  Hydrea does not have paresthesias noted as a side effect.

## 2020-07-18 ENCOUNTER — Other Ambulatory Visit: Payer: Self-pay | Admitting: Internal Medicine

## 2020-07-18 DIAGNOSIS — Z1231 Encounter for screening mammogram for malignant neoplasm of breast: Secondary | ICD-10-CM

## 2020-07-31 ENCOUNTER — Ambulatory Visit
Admission: RE | Admit: 2020-07-31 | Discharge: 2020-07-31 | Disposition: A | Discharge status: Home / Self Care | Payer: Medicare Other | Source: Ambulatory Visit | Attending: Internal Medicine | Admitting: Internal Medicine

## 2020-07-31 ENCOUNTER — Other Ambulatory Visit: Payer: Self-pay

## 2020-07-31 DIAGNOSIS — R202 Paresthesia of skin: Secondary | ICD-10-CM | POA: Diagnosis not present

## 2020-07-31 DIAGNOSIS — R200 Anesthesia of skin: Secondary | ICD-10-CM | POA: Diagnosis not present

## 2020-07-31 DIAGNOSIS — J320 Chronic maxillary sinusitis: Secondary | ICD-10-CM | POA: Diagnosis not present

## 2020-07-31 DIAGNOSIS — J3489 Other specified disorders of nose and nasal sinuses: Secondary | ICD-10-CM | POA: Diagnosis not present

## 2020-07-31 MED ORDER — GADOBENATE DIMEGLUMINE 529 MG/ML IV SOLN
12.0000 mL | Freq: Once | INTRAVENOUS | Status: AC | PRN
  Administered 2020-07-31: 12 mL via INTRAVENOUS

## 2020-08-13 ENCOUNTER — Inpatient Hospital Stay: Payer: Medicare Other

## 2020-08-13 ENCOUNTER — Encounter: Payer: Self-pay | Admitting: Neurology

## 2020-08-13 ENCOUNTER — Inpatient Hospital Stay: Payer: Medicare Other | Attending: Oncology | Admitting: Oncology

## 2020-08-13 ENCOUNTER — Telehealth: Payer: Self-pay | Admitting: Oncology

## 2020-08-13 ENCOUNTER — Other Ambulatory Visit: Payer: Self-pay

## 2020-08-13 VITALS — BP 140/59 | HR 78 | Temp 97.0°F | Resp 16 | Ht 62.0 in | Wt 139.5 lb

## 2020-08-13 DIAGNOSIS — Z880 Allergy status to penicillin: Secondary | ICD-10-CM | POA: Insufficient documentation

## 2020-08-13 DIAGNOSIS — Z888 Allergy status to other drugs, medicaments and biological substances status: Secondary | ICD-10-CM | POA: Insufficient documentation

## 2020-08-13 DIAGNOSIS — D473 Essential (hemorrhagic) thrombocythemia: Secondary | ICD-10-CM

## 2020-08-13 DIAGNOSIS — Z79899 Other long term (current) drug therapy: Secondary | ICD-10-CM | POA: Insufficient documentation

## 2020-08-13 DIAGNOSIS — D75839 Thrombocytosis, unspecified: Secondary | ICD-10-CM | POA: Insufficient documentation

## 2020-08-13 DIAGNOSIS — Z885 Allergy status to narcotic agent status: Secondary | ICD-10-CM | POA: Diagnosis not present

## 2020-08-13 DIAGNOSIS — C946 Myelodysplastic disease, not classified: Secondary | ICD-10-CM | POA: Diagnosis not present

## 2020-08-13 DIAGNOSIS — G629 Polyneuropathy, unspecified: Secondary | ICD-10-CM | POA: Diagnosis not present

## 2020-08-13 DIAGNOSIS — R202 Paresthesia of skin: Secondary | ICD-10-CM | POA: Diagnosis not present

## 2020-08-13 LAB — CMP (CANCER CENTER ONLY)
ALT: 14 U/L (ref 0–44)
AST: 22 U/L (ref 15–41)
Albumin: 4.1 g/dL (ref 3.5–5.0)
Alkaline Phosphatase: 79 U/L (ref 38–126)
Anion gap: 9 (ref 5–15)
BUN: 13 mg/dL (ref 8–23)
CO2: 25 mmol/L (ref 22–32)
Calcium: 9 mg/dL (ref 8.9–10.3)
Chloride: 104 mmol/L (ref 98–111)
Creatinine: 1.01 mg/dL — ABNORMAL HIGH (ref 0.44–1.00)
GFR, Estimated: 58 mL/min — ABNORMAL LOW (ref >60)
Glucose, Bld: 94 mg/dL (ref 70–99)
Potassium: 3.9 mmol/L (ref 3.5–5.1)
Sodium: 138 mmol/L (ref 135–145)
Total Bilirubin: 0.5 mg/dL (ref 0.3–1.2)
Total Protein: 6.9 g/dL (ref 6.5–8.1)

## 2020-08-13 LAB — CBC WITH DIFFERENTIAL (CANCER CENTER ONLY)
Abs Immature Granulocytes: 0.03 10*3/uL (ref 0.00–0.07)
Basophils Absolute: 0.1 10*3/uL (ref 0.0–0.1)
Basophils Relative: 1 %
Eosinophils Absolute: 0.1 10*3/uL (ref 0.0–0.5)
Eosinophils Relative: 1 %
HCT: 44.6 % (ref 36.0–46.0)
Hemoglobin: 15.1 g/dL — ABNORMAL HIGH (ref 12.0–15.0)
Immature Granulocytes: 0 %
Lymphocytes Relative: 25 %
Lymphs Abs: 1.9 10*3/uL (ref 0.7–4.0)
MCH: 38 pg — ABNORMAL HIGH (ref 26.0–34.0)
MCHC: 33.9 g/dL (ref 30.0–36.0)
MCV: 112.3 fL — ABNORMAL HIGH (ref 80.0–100.0)
Monocytes Absolute: 0.7 10*3/uL (ref 0.1–1.0)
Monocytes Relative: 9 %
Neutro Abs: 4.8 10*3/uL (ref 1.7–7.7)
Neutrophils Relative %: 64 %
Platelet Count: 861 10*3/uL — ABNORMAL HIGH (ref 150–400)
RBC: 3.97 MIL/uL (ref 3.87–5.11)
RDW: 14.8 % (ref 11.5–15.5)
WBC Count: 7.5 10*3/uL (ref 4.0–10.5)
nRBC: 0 % (ref 0.0–0.2)

## 2020-08-13 NOTE — Telephone Encounter (Signed)
Scheduled per 11/17 los. No avs or calendar needed to be printed. Pt is aware of appt times and date.

## 2020-08-13 NOTE — Progress Notes (Signed)
Hematology and Oncology Follow Up Visit  Anna Fuller [id number] 1946-02-26 74 y.o. 08/13/2020 7:39 AM      Principle Diagnosis: 74 year old woman with JAK2 positive myeloproliferative disorder diagnosed in 2007.  She presented with essential thrombocythemia.  Prior Therapy:   She is status post Anagrelide therapy discontinued due to do intolerance 2008.   Current therapy:   She started on hydroxyurea 2009.  Her current dose is 1500 mg daily starting on 08/06/2016.  Interim History: Anna Fuller returns today for a follow-up visit.  Since her last visit, she reports no major changes in her health.  She continues to have issues with paresthesias involving her scalp body and bilateral lower extremity neuropathy.  She underwent work-up with Dr. Renold Fuller including laboratory data and MRI of the brain which did not show any acute pathology.  Despite the symptoms she continues to be active and attends to activities of daily living.  She has not reported any decline in her performance status.  She still to ambulate without any difficulties.  She continues to tolerate hydroxyurea without any recent bleeding or thrombosis.        Medications: Unchanged on review. Current Outpatient Prescriptions  Medication Sig Dispense Refill  . aspirin 81 MG tablet Take 81 mg by mouth daily.        . cholecalciferol (VITAMIN D) 1000 UNITS tablet Take 2,000 Units by mouth daily.      Marland Kitchen estrogens, conjugated, (PREMARIN) 0.3 MG tablet Take 1 tablet (0.3 mg total) by mouth daily. Take daily for 21 days then do not take for 7 days.  30 tablet  11  . hydroxyurea (HYDREA) 500 MG capsule Take one tab twice a day.  Every other night take an additional tablet at night.  (Alternate 1000mg  and 1500mg ).  120 capsule  3  . simvastatin (ZOCOR) 40 MG tablet Take 1 tablet (40 mg total) by mouth at bedtime.  30 tablet  11         Allergies: Reviewed with the patient again today. Allergies  Allergen Reactions  .  Codeine Nausea And Vomiting  . Iodine Anaphylaxis    Contrast dye  . Penicillins Other (See Comments)     Physical Exam:   Blood pressure (!) 140/59, pulse 78, temperature (!) 97 F (36.1 C), temperature source Tympanic, resp. rate 16, height 5\' 2"  (1.575 m), weight 139 lb 8 oz (63.3 kg), SpO2 99 %.      ECOG: 1    General appearance: Alert, awake without any distress. Head: Atraumatic without abnormalities Oropharynx: Without any thrush or ulcers. Eyes: No scleral icterus. Lymph nodes: No lymphadenopathy noted in the cervical, supraclavicular, or axillary nodes Heart:regular rate and rhythm, without any murmurs or gallops.   Lung: Clear to auscultation without any rhonchi, wheezes or dullness to percussion. Abdomin: Soft, nontender without any shifting dullness or ascites. Musculoskeletal: No clubbing or cyanosis. Neurological: No motor or sensory deficits. Skin: No rashes or lesions.                Lab Results: Lab Results  Component Value Date   WBC 8.1 07/11/2020   HGB 15.7 (H) 07/11/2020   HCT 44.8 07/11/2020   MCV 109.0 (H) 07/11/2020   PLT 919 (H) 07/11/2020     Chemistry      Component Value Date/Time   NA 142 07/11/2020 1037   NA 140 09/28/2017 0811   K 5.2 07/11/2020 1037   K 3.8 09/28/2017 0811   CL 106 07/11/2020 1037  CL 105 01/30/2013 0825   CO2 24 07/11/2020 1037   CO2 25 09/28/2017 0811   BUN 11 07/11/2020 1037   BUN 14.6 09/28/2017 0811   CREATININE 1.05 (H) 07/11/2020 1037   CREATININE 0.9 09/28/2017 0811      Component Value Date/Time   CALCIUM 9.5 07/11/2020 1037   CALCIUM 9.1 09/28/2017 0811   ALKPHOS 83 05/27/2020 0731   ALKPHOS 75 09/28/2017 0811   AST 21 07/11/2020 1037   AST 20 05/27/2020 0731   AST 23 09/28/2017 0811   ALT 14 07/11/2020 1037   ALT 12 05/27/2020 0731   ALT 23 09/28/2017 0811   BILITOT 0.6 07/11/2020 1037   BILITOT 0.6 05/27/2020 0731   BILITOT 0.46 09/28/2017 0811         Impression  and Plan:  74 year old woman with:  1.  Essential thrombocythemia diagnosed in 2007.  She was found to have JAK2 positive myeloproliferative disorder.  She is currently on hydroxyurea which has maintained her platelet counts under reasonable control.  Her platelet count on October 15 was elevated at 919 previously was 751 in August.  Risks and benefits of continuing hydroxyurea versus dose adjustments were discussed today.  Laboratory data from today reviewed and showed platelet count of 861 which is slightly higher than her target of 800.  After discussion today, she opted against dose adjustment given the potential toxicity with increased dose of hydroxyurea.  We will continue to monitor closely and will consider adding an extra dose of hydroxyurea for 2 days only if her platelets continue to go up.    2.  Thrombosis prophylaxis: I recommended continuing aspirin given her low risk of thrombosis.  3.  Constitutional symptoms: She does have few issues related to her myeloproliferative disorder including body aches and sweats at times but remains manageable.   4.  Age-appropriate health screening: She continues to be up-to-date and completed her Covid vaccination booster.  5.  Diffuse paresthesias and peripheral neuropathy: Unclear etiology.  Her work-up has been unrevealing at this time including MRI of the brain as well as laboratory testing.  Constitutional symptoms related to myeloproliferative disorder could be a possibility but a neurological work-up is recommended.  I agree with Dr. Renold Fuller about neurology referral.      6.  Follow-up: She will return in 2 months for a follow-up visit.  30   minutes were dedicated to this visit.  Time was spent on reviewing laboratory data, discussing treatment options and addressing complications related to her therapy and condition.     Anna Button, MD 11/17/20217:39 AM`

## 2020-08-20 ENCOUNTER — Other Ambulatory Visit: Payer: Self-pay

## 2020-08-20 ENCOUNTER — Ambulatory Visit
Admission: RE | Admit: 2020-08-20 | Discharge: 2020-08-20 | Disposition: A | Discharge status: Home / Self Care | Payer: Medicare Other | Source: Ambulatory Visit | Attending: Internal Medicine | Admitting: Internal Medicine

## 2020-08-20 DIAGNOSIS — Z1231 Encounter for screening mammogram for malignant neoplasm of breast: Secondary | ICD-10-CM

## 2020-08-29 ENCOUNTER — Ambulatory Visit (INDEPENDENT_AMBULATORY_CARE_PROVIDER_SITE_OTHER): Payer: Medicare Other | Admitting: Neurology

## 2020-08-29 ENCOUNTER — Encounter: Payer: Self-pay | Admitting: Neurology

## 2020-08-29 ENCOUNTER — Other Ambulatory Visit: Payer: Self-pay

## 2020-08-29 VITALS — BP 132/82 | HR 74 | Ht 62.0 in | Wt 139.8 lb

## 2020-08-29 DIAGNOSIS — E538 Deficiency of other specified B group vitamins: Secondary | ICD-10-CM | POA: Diagnosis not present

## 2020-08-29 DIAGNOSIS — R202 Paresthesia of skin: Secondary | ICD-10-CM | POA: Diagnosis not present

## 2020-08-29 NOTE — Progress Notes (Signed)
[facility] Clinic Note - Initial Visit   Date: 08/29/20  Anna Fuller MRN: [id number] DOB: 04-08-1946   Dear Dr. Alen Blew:  Thank you for your kind referral of Anna Fuller for consultation of generalized numbness and pain. Although her history is well known to you, please allow [place] to reiterate it for the purpose of our medical record. The patient was accompanied to the clinic by self.    History of Present Illness: Anna Fuller is a 74 y.o. right-handed female with hyperlipidemia and essential thrombocytopenia presenting for evaluation of generalized numbness and pain.   Starting around 2018, she intermittent spells of numbness in the scalp, right ear and face. Over the summer of 2021, she began having numbness in the feet.  More bothersome, however, is the sensation that her "skin does not want to be on her body".  It is described as a burning hot sensation over the legs, arms, and abdomen.  It occurs daily in spells, lasting 5-30 minutes.  There is no pattern or triggers. No weakness or imbalance.  MRI brain was personally viewed and is normal.  Her vitamin B12 level is low at 202.   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  She lives alone, no children.  Out-side paper records, electronic medical record, and images have been reviewed where available and summarized as:  MRI brain wwo contrast 08/01/2020:  Unremarkable  No results found for: HGBA1C Lab Results  Component Value Date   VITAMINB12 202 07/11/2020   Lab Results  Component Value Date   TSH 2.23 07/11/2020   Lab Results  Component Value Date   ESRSEDRATE 2 07/11/2020    Past Medical History:  Diagnosis Date  . [facility])    essential thrombocythemia  . Hyperlipidemia   . Macular degeneration   . Neuromuscular disorder ([facility])    chemo induced neuropathy  . Thrombocytosis     Past Surgical History:  Procedure Laterality  Date  . ABDOMINAL HYSTERECTOMY    . CHOLECYSTECTOMY    . COLONOSCOPY    . extra bones removed     on feet, knees and wrist  . POLYPECTOMY    . TONSILLECTOMY    . TUMOR EXCISION     left leg     Medications:  Outpatient Encounter Medications as of 08/29/2020  Medication Sig Note  . aspirin 81 MG tablet Take 81 mg by mouth daily.     . cholecalciferol (VITAMIN D) 1000 UNITS tablet Take 2,000 Units by mouth daily.   . hydroxyurea (HYDREA) 500 MG capsule TAKE 3 CAPSULES EVERY DAY   . Multiple Vitamins-Minerals (ICAPS AREDS 2 PO) Take by mouth. 06/03/2016: 2 per day  . ondansetron (ZOFRAN) 4 MG tablet Take 1 tablet (4 mg total) by mouth every 8 (eight) hours as needed for nausea or vomiting.   . simvastatin (ZOCOR) 40 MG tablet TAKE 1 TABLET BY MOUTH EVERYDAY AT BEDTIME    Facility-Administered Encounter Medications as of 08/29/2020  Medication  . 0.9 %  sodium chloride infusion    Allergies:  Allergies  Allergen Reactions  . Codeine Nausea And Vomiting  . Iodine Anaphylaxis    Contrast dye  . Penicillins Other (See Comments)    Family History: Family History  Problem Relation Age of Onset  . Pulmonary fibrosis Mother   . Colon polyps Mother   . Diabetes Father   . Esophageal cancer Maternal Uncle   .  Breast cancer Sister   . Colon cancer Neg Hx   . Stomach cancer Neg Hx   . Rectal cancer Neg Hx     Social History: Social History   Tobacco Use  . Smoking status: Former Smoker    Types: Cigarettes    Quit date: 08/18/2002    Years since quitting: 18.0  . Smokeless tobacco: Never Used  Vaping Use  . Vaping Use: Never used  Substance Use Topics  . Alcohol use: Yes    Alcohol/week: 0.0 standard drinks    Comment: rare  . Drug use: No   Social History   Social History Narrative   Right handed    Lives alone    Vital Signs:  BP 132/82   Pulse 74   Ht 5\' 2"  (1.575 m)   Wt 139 lb 12.8 oz (63.4 kg)   SpO2 97%   BMI 25.57 kg/m   Neurological  Exam: MENTAL STATUS including orientation to time, place, person, recent and remote memory, attention span and concentration, language, and fund of knowledge is normal.  Speech is not dysarthric.  CRANIAL NERVES: II:  No visual field defects.  Unremarkable fundi.   III-IV-VI: Pupils equal round and reactive to light.  Normal conjugate, extra-ocular eye movements in all directions of gaze.  No nystagmus.  No ptosis.   V:  Normal facial sensation.    VII:  Normal facial symmetry and movements.   VIII:  Normal hearing and vestibular function.   IX-X:  Normal palatal movement.   XI:  Normal shoulder shrug and head rotation.   XII:  Normal tongue strength and range of motion, no deviation or fasciculation.  MOTOR:  No atrophy, fasciculations or abnormal movements.  No pronator drift.   Upper Extremity:  Right  Left  Deltoid  5/5   5/5   Biceps  5/5   5/5   Triceps  5/5   5/5   Infraspinatus 5/5  5/5  Medial pectoralis 5/5  5/5  Wrist extensors  5/5   5/5   Wrist flexors  5/5   5/5   Finger extensors  5/5   5/5   Finger flexors  5/5   5/5   Dorsal interossei  5/5   5/5   Abductor pollicis  5/5   5/5   Tone (Ashworth scale)  0  0   Lower Extremity:  Right  Left  Hip flexors  5/5   5/5   Hip extensors  5/5   5/5   Adductor 5/5  5/5  Abductor 5/5  5/5  Knee flexors  5/5   5/5   Knee extensors  5/5   5/5   Dorsiflexors  5/5   5/5   Plantarflexors  5/5   5/5   Toe extensors  5/5   5/5   Toe flexors  5/5   5/5   Tone (Ashworth scale)  0  0   MSRs:  Right        Left                  brachioradialis 2+  2+  biceps 2+  2+  triceps 2+  2+  patellar 2+  2+  ankle jerk 2+  2+  Hoffman no  no  plantar response down  down   SENSORY:  Normal and symmetric perception of light touch, pinprick, vibration, and proprioception.  Romberg's sign absent.   COORDINATION/GAIT: Normal finger-to- nose-finger and heel-to-shin.  Intact rapid alternating movements bilaterally.  Able to rise from a  chair without using arms.  Gait narrow based and stable. Tandem and stressed gait intact.    IMPRESSION: Dysesthesias, nonspecific.  Symptoms do not follow a cutaneous nerve or dermatomal distribution.  Neurological exam is normal, which is reassuring. She has low vitmain B12 (161), which can certainly contribute to abnormal sensation, so I have asked her to start taking OTC vitamin B12 10106mcg daily.  If symptoms get worse, consider NCS/EMG, but my overall suspicion for neuropathy, as her clinical exam does not support this.   Return to clinic as needed  Thank you for allowing me to participate in patient's care.  If I can answer any additional questions, I would be pleased to do so.    Sincerely,    Emiliano Welshans K. Posey Pronto, DO

## 2020-08-29 NOTE — Patient Instructions (Signed)
Start vitamin B12 1057mcg daily  If your symptoms get worse, please come back and see me

## 2020-09-03 ENCOUNTER — Other Ambulatory Visit: Payer: Self-pay | Admitting: Internal Medicine

## 2020-09-15 ENCOUNTER — Encounter: Payer: Self-pay | Admitting: Oncology

## 2020-09-16 ENCOUNTER — Other Ambulatory Visit: Payer: Self-pay | Admitting: Oncology

## 2020-09-16 DIAGNOSIS — D473 Essential (hemorrhagic) thrombocythemia: Secondary | ICD-10-CM

## 2020-09-16 DIAGNOSIS — R202 Paresthesia of skin: Secondary | ICD-10-CM

## 2020-10-08 ENCOUNTER — Other Ambulatory Visit: Payer: Self-pay

## 2020-10-08 ENCOUNTER — Inpatient Hospital Stay: Payer: Medicare Other

## 2020-10-08 ENCOUNTER — Inpatient Hospital Stay: Payer: Medicare Other | Attending: Oncology | Admitting: Oncology

## 2020-10-08 VITALS — BP 132/53 | HR 77 | Temp 97.8°F | Resp 18 | Ht 62.0 in | Wt 139.0 lb

## 2020-10-08 DIAGNOSIS — E538 Deficiency of other specified B group vitamins: Secondary | ICD-10-CM | POA: Diagnosis not present

## 2020-10-08 DIAGNOSIS — I8290 Acute embolism and thrombosis of unspecified vein: Secondary | ICD-10-CM | POA: Diagnosis not present

## 2020-10-08 DIAGNOSIS — Z888 Allergy status to other drugs, medicaments and biological substances status: Secondary | ICD-10-CM | POA: Diagnosis not present

## 2020-10-08 DIAGNOSIS — D473 Essential (hemorrhagic) thrombocythemia: Secondary | ICD-10-CM | POA: Diagnosis not present

## 2020-10-08 DIAGNOSIS — Z880 Allergy status to penicillin: Secondary | ICD-10-CM | POA: Diagnosis not present

## 2020-10-08 DIAGNOSIS — R202 Paresthesia of skin: Secondary | ICD-10-CM

## 2020-10-08 DIAGNOSIS — Z885 Allergy status to narcotic agent status: Secondary | ICD-10-CM | POA: Diagnosis not present

## 2020-10-08 DIAGNOSIS — Z79899 Other long term (current) drug therapy: Secondary | ICD-10-CM | POA: Insufficient documentation

## 2020-10-08 DIAGNOSIS — C946 Myelodysplastic disease, not classified: Secondary | ICD-10-CM | POA: Insufficient documentation

## 2020-10-08 DIAGNOSIS — G629 Polyneuropathy, unspecified: Secondary | ICD-10-CM | POA: Diagnosis not present

## 2020-10-08 LAB — CMP (CANCER CENTER ONLY)
ALT: 18 U/L (ref 0–44)
AST: 22 U/L (ref 15–41)
Albumin: 4.3 g/dL (ref 3.5–5.0)
Alkaline Phosphatase: 77 U/L (ref 38–126)
Anion gap: 9 (ref 5–15)
BUN: 23 mg/dL (ref 8–23)
CO2: 25 mmol/L (ref 22–32)
Calcium: 9.6 mg/dL (ref 8.9–10.3)
Chloride: 104 mmol/L (ref 98–111)
Creatinine: 1.16 mg/dL — ABNORMAL HIGH (ref 0.44–1.00)
GFR, Estimated: 49 mL/min — ABNORMAL LOW (ref >60)
Glucose, Bld: 99 mg/dL (ref 70–99)
Potassium: 4.3 mmol/L (ref 3.5–5.1)
Sodium: 138 mmol/L (ref 135–145)
Total Bilirubin: 0.7 mg/dL (ref 0.3–1.2)
Total Protein: 7.2 g/dL (ref 6.5–8.1)

## 2020-10-08 LAB — CBC WITH DIFFERENTIAL (CANCER CENTER ONLY)
Abs Immature Granulocytes: 0.05 10*3/uL (ref 0.00–0.07)
Basophils Absolute: 0.1 10*3/uL (ref 0.0–0.1)
Basophils Relative: 1 %
Eosinophils Absolute: 0.1 10*3/uL (ref 0.0–0.5)
Eosinophils Relative: 2 %
HCT: 47.3 % — ABNORMAL HIGH (ref 36.0–46.0)
Hemoglobin: 16.1 g/dL — ABNORMAL HIGH (ref 12.0–15.0)
Immature Granulocytes: 1 %
Lymphocytes Relative: 26 %
Lymphs Abs: 1.9 10*3/uL (ref 0.7–4.0)
MCH: 38.1 pg — ABNORMAL HIGH (ref 26.0–34.0)
MCHC: 34 g/dL (ref 30.0–36.0)
MCV: 111.8 fL — ABNORMAL HIGH (ref 80.0–100.0)
Monocytes Absolute: 0.7 10*3/uL (ref 0.1–1.0)
Monocytes Relative: 9 %
Neutro Abs: 4.7 10*3/uL (ref 1.7–7.7)
Neutrophils Relative %: 61 %
Platelet Count: 800 10*3/uL — ABNORMAL HIGH (ref 150–400)
RBC: 4.23 MIL/uL (ref 3.87–5.11)
RDW: 14.6 % (ref 11.5–15.5)
WBC Count: 7.6 10*3/uL (ref 4.0–10.5)
nRBC: 0 % (ref 0.0–0.2)

## 2020-10-08 LAB — VITAMIN B12: Vitamin B-12: 1734 pg/mL — ABNORMAL HIGH (ref 180–914)

## 2020-10-08 NOTE — Progress Notes (Signed)
Hematology and Oncology Follow Up Visit  Anna Fuller [id number] 08/10/1946 75 y.o. 10/08/2020 8:14 AM      Principle Diagnosis: 75 year old woman with essential thrombocythemia diagnosed in 2007.  She presented with JAK2 positive myeloproliferative disorder with elevated platelets..  Prior Therapy:   She is status post Anagrelide therapy discontinued due to do intolerance 2008.   Current therapy:   She started on hydroxyurea 2009.  Her current dose is 1500 mg daily starting on 08/06/2016.  Interim History: Anna Fuller is here for return evaluation.  Since the last visit, she reports her paresthesias has improved at this time after she was started on B12 replacement.  Her performance status and quality of life remained reasonable.  She continues to tolerate hydroxyurea without any new complaints.  She denies any thrombosis or bleeding episodes.  She denies any recent hospitalization or illnesses.        Medications: Unchanged on review. Current Outpatient Prescriptions  Medication Sig Dispense Refill  . aspirin 81 MG tablet Take 81 mg by mouth daily.        . cholecalciferol (VITAMIN D) 1000 UNITS tablet Take 2,000 Units by mouth daily.      Marland Kitchen estrogens, conjugated, (PREMARIN) 0.3 MG tablet Take 1 tablet (0.3 mg total) by mouth daily. Take daily for 21 days then do not take for 7 days.  30 tablet  11  . hydroxyurea (HYDREA) 500 MG capsule Take one tab twice a day.  Every other night take an additional tablet at night.  (Alternate 1000mg  and 1500mg ).  120 capsule  3  . simvastatin (ZOCOR) 40 MG tablet Take 1 tablet (40 mg total) by mouth at bedtime.  30 tablet  11         Allergies: Reviewed with the patient again today. Allergies  Allergen Reactions  . Codeine Nausea And Vomiting  . Iodine Anaphylaxis    Contrast dye  . Penicillins Other (See Comments)     Physical Exam:   Blood pressure (!) 132/53, pulse 77, temperature 97.8 F (36.6 C), temperature source  Tympanic, resp. rate 18, height 5\' 2"  (1.575 m), weight 139 lb (63 kg), SpO2 98 %.      ECOG: 1   General appearance: Comfortable appearing without any discomfort Head: Normocephalic without any trauma Oropharynx: Mucous membranes are moist and pink without any thrush or ulcers. Eyes: Pupils are equal and round reactive to light. Lymph nodes: No cervical, supraclavicular, inguinal or axillary lymphadenopathy.   Heart:regular rate and rhythm.  S1 and S2 without leg edema. Lung: Clear without any rhonchi or wheezes.  No dullness to percussion. Abdomin: Soft, nontender, nondistended with good bowel sounds.  No hepatosplenomegaly. Musculoskeletal: No joint deformity or effusion.  Full range of motion noted. Neurological: No deficits noted on motor, sensory and deep tendon reflex exam. Skin: No petechial rash or dryness.  Appeared moist.                 Lab Results: Lab Results  Component Value Date   WBC 7.6 10/08/2020   HGB 16.1 (H) 10/08/2020   HCT 47.3 (H) 10/08/2020   MCV 111.8 (H) 10/08/2020   PLT 800 (H) 10/08/2020     Chemistry      Component Value Date/Time   NA 138 10/08/2020 0732   NA 140 09/28/2017 0811   K 4.3 10/08/2020 0732   K 3.8 09/28/2017 0811   CL 104 10/08/2020 0732   CL 105 01/30/2013 0825   CO2 25 10/08/2020 0732  CO2 25 09/28/2017 0811   BUN 23 10/08/2020 0732   BUN 14.6 09/28/2017 0811   CREATININE 1.16 (H) 10/08/2020 0732   CREATININE 1.05 (H) 07/11/2020 1037   CREATININE 0.9 09/28/2017 0811      Component Value Date/Time   CALCIUM 9.6 10/08/2020 0732   CALCIUM 9.1 09/28/2017 0811   ALKPHOS 77 10/08/2020 0732   ALKPHOS 75 09/28/2017 0811   AST 22 10/08/2020 0732   AST 23 09/28/2017 0811   ALT 18 10/08/2020 0732   ALT 23 09/28/2017 0811   BILITOT 0.7 10/08/2020 0732   BILITOT 0.46 09/28/2017 0811         Impression and Plan:  75 year old woman with:  1.  Myeloproliferative disorder presented with elevated platelets  and JAK2 positive mutation.  The natural course of this disease was updated and treatment options were reviewed.  She is currently on hydroxyurea with reasonable control of her platelet count as her platelets have dropped close to 800.  Risks and benefits of continuing this treatment were reviewed today which she is agreeable to continue.  I see no need for dose adjustment at this time.    2.  Thrombosis prophylaxis: No issues with thrombosis or bleeding.  Continues to be on aspirin.  3.  Constitutional symptoms: Manageable at this time and related to her myeloproliferative disorder.   4.  Age-appropriate health screening: She is up-to-date at this time with age-appropriate screening.  5.  Paresthesias and peripheral neuropathy: She was evaluated by neurology and felt it could be related to her B12 borderline level.  She is currently on B12 replacement with improvement in her symptoms.  B12 level has been repeated today currently pending.      6.  Follow-up: In 8 weeks for repeat follow-up.  30   minutes were spent on this encounter.  Time was dedicated to reviewing disease status, reviewing laboratory data and future plan of care review.     Zola Button, MD 1/12/20228:14 AM`

## 2020-11-18 ENCOUNTER — Other Ambulatory Visit: Payer: Self-pay

## 2020-11-18 ENCOUNTER — Other Ambulatory Visit: Payer: Medicare Other | Admitting: Internal Medicine

## 2020-11-18 DIAGNOSIS — Z0000 Encounter for general adult medical examination without abnormal findings: Secondary | ICD-10-CM

## 2020-11-18 DIAGNOSIS — E785 Hyperlipidemia, unspecified: Secondary | ICD-10-CM

## 2020-11-18 DIAGNOSIS — Z1329 Encounter for screening for other suspected endocrine disorder: Secondary | ICD-10-CM

## 2020-11-18 DIAGNOSIS — D473 Essential (hemorrhagic) thrombocythemia: Secondary | ICD-10-CM

## 2020-11-18 DIAGNOSIS — Z13220 Encounter for screening for lipoid disorders: Secondary | ICD-10-CM

## 2020-11-18 LAB — COMPLETE METABOLIC PANEL WITH GFR
AG Ratio: 2.1 (calc) (ref 1.0–2.5)
ALT: 18 U/L (ref 6–29)
AST: 22 U/L (ref 10–35)
Albumin: 4.6 g/dL (ref 3.6–5.1)
Alkaline phosphatase (APISO): 76 U/L (ref 37–153)
BUN/Creatinine Ratio: 18 (calc) (ref 6–22)
BUN: 21 mg/dL (ref 7–25)
CO2: 27 mmol/L (ref 20–32)
Calcium: 9.9 mg/dL (ref 8.6–10.4)
Chloride: 102 mmol/L (ref 98–110)
Creat: 1.15 mg/dL — ABNORMAL HIGH (ref 0.60–0.93)
GFR, Est African American: 54 mL/min/{1.73_m2} — ABNORMAL LOW (ref >=60)
GFR, Est Non African American: 46 mL/min/{1.73_m2} — ABNORMAL LOW (ref >=60)
Globulin: 2.2 g/dL (calc) (ref 1.9–3.7)
Glucose, Bld: 79 mg/dL (ref 65–99)
Potassium: 5.5 mmol/L — ABNORMAL HIGH (ref 3.5–5.3)
Sodium: 139 mmol/L (ref 135–146)
Total Bilirubin: 0.5 mg/dL (ref 0.2–1.2)
Total Protein: 6.8 g/dL (ref 6.1–8.1)

## 2020-11-18 LAB — CBC WITH DIFFERENTIAL/PLATELET
Absolute Monocytes: 676 cells/uL (ref 200–950)
Basophils Absolute: 91 cells/uL (ref 0–200)
Basophils Relative: 1.2 %
Eosinophils Absolute: 122 cells/uL (ref 15–500)
Eosinophils Relative: 1.6 %
HCT: 45.3 % — ABNORMAL HIGH (ref 35.0–45.0)
Hemoglobin: 15.7 g/dL — ABNORMAL HIGH (ref 11.7–15.5)
Lymphs Abs: 1740 cells/uL (ref 850–3900)
MCH: 37.6 pg — ABNORMAL HIGH (ref 27.0–33.0)
MCHC: 34.7 g/dL (ref 32.0–36.0)
MCV: 108.4 fL — ABNORMAL HIGH (ref 80.0–100.0)
MPV: 8.9 fL (ref 7.5–12.5)
Monocytes Relative: 8.9 %
Neutro Abs: 4970 cells/uL (ref 1500–7800)
Neutrophils Relative %: 65.4 %
Platelets: 791 10*3/uL — ABNORMAL HIGH (ref 140–400)
RBC: 4.18 10*6/uL (ref 3.80–5.10)
RDW: 13.3 % (ref 11.0–15.0)
Total Lymphocyte: 22.9 %
WBC: 7.6 10*3/uL (ref 3.8–10.8)

## 2020-11-18 LAB — TSH: TSH: 2.64 mIU/L (ref 0.40–4.50)

## 2020-11-18 LAB — LIPID PANEL
Cholesterol: 162 mg/dL (ref <200)
HDL: 66 mg/dL (ref >=50)
LDL Cholesterol (Calc): 76 mg/dL (calc)
Non-HDL Cholesterol (Calc): 96 mg/dL (calc) (ref <130)
Total CHOL/HDL Ratio: 2.5 (calc) (ref <5.0)
Triglycerides: 123 mg/dL (ref <150)

## 2020-11-20 ENCOUNTER — Ambulatory Visit (INDEPENDENT_AMBULATORY_CARE_PROVIDER_SITE_OTHER): Payer: Medicare Other | Admitting: Internal Medicine

## 2020-11-20 ENCOUNTER — Other Ambulatory Visit: Payer: Self-pay

## 2020-11-20 ENCOUNTER — Encounter: Payer: Self-pay | Admitting: Internal Medicine

## 2020-11-20 VITALS — BP 110/70 | Ht 62.0 in | Wt 141.0 lb

## 2020-11-20 DIAGNOSIS — Z90722 Acquired absence of ovaries, bilateral: Secondary | ICD-10-CM

## 2020-11-20 DIAGNOSIS — Z780 Asymptomatic menopausal state: Secondary | ICD-10-CM

## 2020-11-20 DIAGNOSIS — Z90710 Acquired absence of both cervix and uterus: Secondary | ICD-10-CM

## 2020-11-20 DIAGNOSIS — E785 Hyperlipidemia, unspecified: Secondary | ICD-10-CM | POA: Diagnosis not present

## 2020-11-20 DIAGNOSIS — Z9079 Acquired absence of other genital organ(s): Secondary | ICD-10-CM

## 2020-11-20 DIAGNOSIS — D473 Essential (hemorrhagic) thrombocythemia: Secondary | ICD-10-CM | POA: Diagnosis not present

## 2020-11-20 DIAGNOSIS — Z0000 Encounter for general adult medical examination without abnormal findings: Secondary | ICD-10-CM

## 2020-11-20 DIAGNOSIS — D126 Benign neoplasm of colon, unspecified: Secondary | ICD-10-CM | POA: Diagnosis not present

## 2020-11-20 DIAGNOSIS — Z1589 Genetic susceptibility to other disease: Secondary | ICD-10-CM | POA: Diagnosis not present

## 2020-11-20 LAB — POCT URINALYSIS DIPSTICK
Appearance: NEGATIVE
Bilirubin, UA: NEGATIVE
Blood, UA: NEGATIVE
Glucose, UA: NEGATIVE
Ketones, UA: NEGATIVE
Leukocytes, UA: NEGATIVE
Nitrite, UA: NEGATIVE
Odor: NEGATIVE
Protein, UA: NEGATIVE
Spec Grav, UA: 1.01 (ref 1.010–1.025)
Urobilinogen, UA: 0.2 E.U./dL
pH, UA: 6.5 (ref 5.0–8.0)

## 2020-11-20 NOTE — Patient Instructions (Signed)
It was a pleasure to see you today.  Continue current medications and follow-up in 1 year or as needed.

## 2020-11-20 NOTE — Progress Notes (Signed)
Subjective:    Patient ID: Anna Fuller, female    DOB: Oct 30, 1945, 75 y.o.   MRN: [id number]  HPI 75 year old Female with history of essential thrombocytosis JAK2 myeloproliferative disorder diagnosed in 2007 and followed by Dr. Alen Blew.  History of hyperlipidemia treated with simvastatin.  Long history of estrogen replacement does not want to be taken off of it.  She is allergic to contrast dye, cortisone, and Penicillin.  Hospitalized with fever of unknown origin 20, cholecystectomy 1993, hysterectomy with bilateral oophorectomy 1991.  Right wrist surgery 2008.  Foot surgery both feet 1965, 1968, 1969.  Has had left and right knee surgeries.  Social history: She is single and retired.  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  Completed 2 years of college.  Does not smoke or consume alcohol.  Enjoys walking and doing yard work.  Family history: Her sister, Robyne Matar is also a patient.  No other siblings.  Father died at age 61 with complications of diabetes.  Mother died at age 62 of pulmonary fibrosis.  She had tetanus immunization in 2006 but had significant local reaction so it will not be repeated at her age.  Has had 3 Moderna vaccines for COVID-19.  Has had both Prevnar and pneumococcal 23 vaccines.  Labs are reviewed.  Lipid panel is normal.  TSH is normal.  She has some mild chronic kidney disease with creatinine 1.15.  MCV is 108.4, hemoglobin 15.7 g, white blood cell count 7600 and platelet count 791,000.  Remains on Hydrea 500 mg 3 capsules daily.  Takes aspirin 81 mg daily.  Review of Systems history of macular degeneration     Objective:   Physical Exam Blood pressure 110/70 weight 141 pounds height 5 feet 2 inches BMI 25.79.  She is afebrile.  Skin warm and dry.  No cervical adenopathy.  TMs clear.  Neck is supple.  No carotid bruits.  Chest clear to auscultation.  Cardiac exam regular rate  and rhythm.  Breast without masses.  Abdomen soft nondistended without hepatosplenomegaly masses or tenderness.  Pelvic exam deferred status post hysterectomy with bilateral oophorectomy in 1991.  Neuro intact without focal deficits.  Affect thought and judgment are normal.  No lower extremity pitting edema.       Assessment & Plan:  Essential thrombocytosis-maintained on Hydrea 500 mg 3 tablets daily and followed by Dr. Alen Blew  Hyperlipidemia-treated with statin medication and lipid panel is normal  History of estrogen replacement status post TAH/BSO and does not want to be off of it  Plan: Immunizations reviewed.  Labs reviewed.  Had colonoscopy in 2019.  Had 3D mammogram in November 2021.  Blood pressure is excellent.  Recommend bone density study.  Subjective:   Patient presents for Medicare Annual/Subsequent preventive examination.  Review Past Medical/Family/Social: See above   Risk Factors  Current exercise habits: Active Dietary issues discussed: Yes  Cardiac risk factors: Hyperlipidemia  Depression Screen  (Note: if answer to either of the following is "Yes", a more complete depression screening is indicated)   Over the past two weeks, have you felt down, depressed or hopeless? No  Over the past two weeks, have you felt little interest or pleasure in doing things? No Have you lost interest or pleasure in daily life?  Seldom Do you often feel hopeless? No Do you cry easily over simple problems? No   Activities of Daily Living  In your present state of health,  do you have any difficulty performing the following activities?:   Driving? No  Managing money? No  Feeding yourself? No  Getting from bed to chair? No  Climbing a flight of stairs? No  Preparing food and eating?: No  Bathing or showering? No  Getting dressed: No  Getting to the toilet? No  Using the toilet:No  Moving around from place to place: No  In the past year have you fallen or had a near fall?:No   Are you sexually active? No  Do you have more than one partner? No   Hearing Difficulties: No  Do you often ask people to speak up or repeat themselves? No  Do you experience ringing or noises in your ears?  Do you have difficulty understanding soft or whispered voices? No  Do you feel that you have a problem with memory? No Do you often misplace items? No    Home Safety:  Do you have a smoke alarm at your residence? Yes Do you have grab bars in the bathroom?  Yes Do you have throw rugs in your house?  No   Cognitive Testing  Alert? Yes Normal Appearance?Yes  Oriented to person? Yes Place? Yes  Time? Yes  Recall of three objects? Yes  Can perform simple calculations? Yes  Displays appropriate judgment?Yes  Can read the correct time from a watch face?Yes   List the Names of Other Physician/Practitioners you currently use:  See referral list for the physicians patient is currently seeing.  Dr. Alen Blew   Review of Systems: See above   Objective:     General appearance: Appears younger than stated age and trim Head: Normocephalic, without obvious abnormality, atraumatic  Eyes: conj clear, EOMi PEERLA  Ears: normal TM's and external ear canals both ears  Nose: Nares normal. Septum midline. Mucosa normal. No drainage or sinus tenderness.  Throat: lips, mucosa, and tongue normal; teeth and gums normal  Neck: no adenopathy, no carotid bruit, no JVD, supple, symmetrical, trachea midline and thyroid not enlarged, symmetric, no tenderness/mass/nodules  No CVA tenderness.  Lungs: clear to auscultation bilaterally  Breasts: normal appearance, no masses Heart: regular rate and rhythm, S1, S2 normal, no murmur, click, rub or gallop  Abdomen: soft, non-tender; bowel sounds normal; no masses, no organomegaly  Musculoskeletal: ROM normal in all joints, no crepitus, no deformity, Normal muscle strengthen. Back  is symmetric, no curvature. Skin: Skin color, texture, turgor normal. No  rashes or lesions  Lymph nodes: Cervical, supraclavicular, and axillary nodes normal.  Neurologic: CN 2 -12 Normal, Normal symmetric reflexes. Normal coordination and gait  Psych: Alert & Oriented x 3, Mood appear stable.    Assessment:    Annual wellness medicare exam   Plan:    During the course of the visit the patient was educated and counseled about appropriate screening and preventive services including:   Annual mammogram  Bone density  Annual flu vaccine     Patient Instructions (the written plan) was given to the patient.  Medicare Attestation  I have personally reviewed:  The patient's medical and social history  Their use of alcohol, tobacco or illicit drugs  Their current medications and supplements  The patient's functional ability including ADLs,fall risks, home safety risks, cognitive, and hearing and visual impairment  Diet and physical activities  Evidence for depression or mood disorders  The patient's weight, height, BMI, and visual acuity have been recorded in the chart. I have made referrals, counseling, and provided education to the patient based  on review of the above and I have provided the patient with a written personalized care plan for preventive services.

## 2020-11-29 ENCOUNTER — Other Ambulatory Visit: Payer: Self-pay | Admitting: Oncology

## 2020-11-29 DIAGNOSIS — D473 Essential (hemorrhagic) thrombocythemia: Secondary | ICD-10-CM

## 2020-12-03 ENCOUNTER — Telehealth: Payer: Self-pay | Admitting: Oncology

## 2020-12-03 ENCOUNTER — Inpatient Hospital Stay: Payer: Medicare Other | Attending: Oncology

## 2020-12-03 ENCOUNTER — Other Ambulatory Visit: Payer: Self-pay

## 2020-12-03 ENCOUNTER — Inpatient Hospital Stay (HOSPITAL_BASED_OUTPATIENT_CLINIC_OR_DEPARTMENT_OTHER): Payer: Medicare Other | Admitting: Oncology

## 2020-12-03 VITALS — BP 128/57 | HR 71 | Temp 97.8°F | Resp 18 | Ht 62.0 in | Wt 140.3 lb

## 2020-12-03 DIAGNOSIS — D473 Essential (hemorrhagic) thrombocythemia: Secondary | ICD-10-CM

## 2020-12-03 DIAGNOSIS — Z888 Allergy status to other drugs, medicaments and biological substances status: Secondary | ICD-10-CM | POA: Diagnosis not present

## 2020-12-03 DIAGNOSIS — Z79899 Other long term (current) drug therapy: Secondary | ICD-10-CM | POA: Insufficient documentation

## 2020-12-03 DIAGNOSIS — G629 Polyneuropathy, unspecified: Secondary | ICD-10-CM | POA: Diagnosis not present

## 2020-12-03 DIAGNOSIS — R110 Nausea: Secondary | ICD-10-CM | POA: Insufficient documentation

## 2020-12-03 DIAGNOSIS — E538 Deficiency of other specified B group vitamins: Secondary | ICD-10-CM

## 2020-12-03 DIAGNOSIS — C946 Myelodysplastic disease, not classified: Secondary | ICD-10-CM | POA: Insufficient documentation

## 2020-12-03 DIAGNOSIS — Z885 Allergy status to narcotic agent status: Secondary | ICD-10-CM | POA: Diagnosis not present

## 2020-12-03 DIAGNOSIS — D75839 Thrombocytosis, unspecified: Secondary | ICD-10-CM | POA: Diagnosis not present

## 2020-12-03 DIAGNOSIS — Z880 Allergy status to penicillin: Secondary | ICD-10-CM | POA: Insufficient documentation

## 2020-12-03 LAB — CBC WITH DIFFERENTIAL (CANCER CENTER ONLY)
Abs Immature Granulocytes: 0.04 10*3/uL (ref 0.00–0.07)
Basophils Absolute: 0.1 10*3/uL (ref 0.0–0.1)
Basophils Relative: 1 %
Eosinophils Absolute: 0.1 10*3/uL (ref 0.0–0.5)
Eosinophils Relative: 2 %
HCT: 47 % — ABNORMAL HIGH (ref 36.0–46.0)
Hemoglobin: 15.9 g/dL — ABNORMAL HIGH (ref 12.0–15.0)
Immature Granulocytes: 1 %
Lymphocytes Relative: 24 %
Lymphs Abs: 2 10*3/uL (ref 0.7–4.0)
MCH: 37.6 pg — ABNORMAL HIGH (ref 26.0–34.0)
MCHC: 33.8 g/dL (ref 30.0–36.0)
MCV: 111.1 fL — ABNORMAL HIGH (ref 80.0–100.0)
Monocytes Absolute: 0.6 10*3/uL (ref 0.1–1.0)
Monocytes Relative: 8 %
Neutro Abs: 5.4 10*3/uL (ref 1.7–7.7)
Neutrophils Relative %: 64 %
Platelet Count: 858 10*3/uL — ABNORMAL HIGH (ref 150–400)
RBC: 4.23 MIL/uL (ref 3.87–5.11)
RDW: 14.6 % (ref 11.5–15.5)
WBC Count: 8.2 10*3/uL (ref 4.0–10.5)
nRBC: 0 % (ref 0.0–0.2)

## 2020-12-03 LAB — CMP (CANCER CENTER ONLY)
ALT: 21 U/L (ref 0–44)
AST: 26 U/L (ref 15–41)
Albumin: 4.5 g/dL (ref 3.5–5.0)
Alkaline Phosphatase: 82 U/L (ref 38–126)
Anion gap: 11 (ref 5–15)
BUN: 17 mg/dL (ref 8–23)
CO2: 24 mmol/L (ref 22–32)
Calcium: 9.4 mg/dL (ref 8.9–10.3)
Chloride: 105 mmol/L (ref 98–111)
Creatinine: 1.13 mg/dL — ABNORMAL HIGH (ref 0.44–1.00)
GFR, Estimated: 50 mL/min — ABNORMAL LOW (ref >60)
Glucose, Bld: 91 mg/dL (ref 70–99)
Potassium: 4 mmol/L (ref 3.5–5.1)
Sodium: 140 mmol/L (ref 135–145)
Total Bilirubin: 0.5 mg/dL (ref 0.3–1.2)
Total Protein: 7.6 g/dL (ref 6.5–8.1)

## 2020-12-03 LAB — VITAMIN B12: Vitamin B-12: 560 pg/mL (ref 180–914)

## 2020-12-03 NOTE — Progress Notes (Signed)
Hematology and Oncology Follow Up Visit  Anna Fuller [id number] 06/02/1946 75 y.o. 12/03/2020 7:54 AM      Principle Diagnosis: 75 year old woman with JAK2 positive myeloproliferative disorder diagnosed in 2007.  She was found to have essential thrombocythemia.  Prior Therapy:   She is status post Anagrelide therapy discontinued due to do intolerance 2008.   Current therapy:   She started on hydroxyurea 2009.  Her current dose is 1500 mg daily starting on 08/06/2016.  Interim History: Anna Fuller returns today for a follow-up visit.  Since the last visit, she reports no major changes in her health.  She continues to tolerate hydroxyurea with similar complaints.  She does report some occasional nausea but no vomiting.  She denies any bleeding complications.  She denies any epistaxis, hematochezia or melena.  She does report her paresthesias has improved with B12 supplementation.  She does have a residual neuropathy however in her lower extremities.        Medications: Updated on review. Current Outpatient Prescriptions  Medication Sig Dispense Refill  . aspirin 81 MG tablet Take 81 mg by mouth daily.        . cholecalciferol (VITAMIN D) 1000 UNITS tablet Take 2,000 Units by mouth daily.      Marland Kitchen estrogens, conjugated, (PREMARIN) 0.3 MG tablet Take 1 tablet (0.3 mg total) by mouth daily. Take daily for 21 days then do not take for 7 days.  30 tablet  11  . hydroxyurea (HYDREA) 500 MG capsule Take one tab twice a day.  Every other night take an additional tablet at night.  (Alternate 1000mg  and 1500mg ).  120 capsule  3  . simvastatin (ZOCOR) 40 MG tablet Take 1 tablet (40 mg total) by mouth at bedtime.  30 tablet  11         Allergies: Reviewed with the patient again today. Allergies  Allergen Reactions  . Codeine Nausea And Vomiting  . Iodine Anaphylaxis    Contrast dye  . Penicillins Other (See Comments)     Physical Exam:    Blood pressure (!) 128/57, pulse  71, temperature 97.8 F (36.6 C), resp. rate 18, height 5\' 2"  (1.575 m), weight 140 lb 4.8 oz (63.6 kg), SpO2 98 %.      ECOG: 1   General appearance: Alert, awake without any distress. Head: Atraumatic without abnormalities Oropharynx: Without any thrush or ulcers. Eyes: No scleral icterus. Lymph nodes: No lymphadenopathy noted in the cervical, supraclavicular, or axillary nodes Heart:regular rate and rhythm, without any murmurs or gallops.   Lung: Clear to auscultation without any rhonchi, wheezes or dullness to percussion. Abdomin: Soft, nontender without any shifting dullness or ascites. Musculoskeletal: No clubbing or cyanosis. Neurological: No motor or sensory deficits. Skin: No rashes or lesions.                Lab Results: Lab Results  Component Value Date   WBC 8.2 12/03/2020   HGB 15.9 (H) 12/03/2020   HCT 47.0 (H) 12/03/2020   MCV 111.1 (H) 12/03/2020   PLT 858 (H) 12/03/2020     Chemistry      Component Value Date/Time   NA 139 11/18/2020 0911   NA 140 09/28/2017 0811   K 5.5 (H) 11/18/2020 0911   K 3.8 09/28/2017 0811   CL 102 11/18/2020 0911   CL 105 01/30/2013 0825   CO2 27 11/18/2020 0911   CO2 25 09/28/2017 0811   BUN 21 11/18/2020 0911   BUN 14.6 09/28/2017 6759  CREATININE 1.15 (H) 11/18/2020 0911   CREATININE 0.9 09/28/2017 0811      Component Value Date/Time   CALCIUM 9.9 11/18/2020 0911   CALCIUM 9.1 09/28/2017 0811   ALKPHOS 77 10/08/2020 0732   ALKPHOS 75 09/28/2017 0811   AST 22 11/18/2020 0911   AST 22 10/08/2020 0732   AST 23 09/28/2017 0811   ALT 18 11/18/2020 0911   ALT 18 10/08/2020 0732   ALT 23 09/28/2017 0811   BILITOT 0.5 11/18/2020 0911   BILITOT 0.7 10/08/2020 0732   BILITOT 0.46 09/28/2017 0811         Impression and Plan:  75 year old woman with:  1.  JAK2 positive myeloproliferative disorder diagnosed in 2007.  She was found to have essential thrombocythemia.   Laboratory data from today  reviewed and showed mild elevation in her platelet count at 858.  This is slightly elevated from her previous baseline of less than 800.  Risks and benefits of continuing hydroxyurea at the current dose versus increasing the dose.  Complication associated with myeloproliferative disorder thrombocytosis were waiting against increased doses of hydroxyurea complications associated with this medication.  After discussion today, is agreeable to continue with the current dose.   2.  Thrombosis prophylaxis: Continues to be on aspirin at this time.  3.  Constitutional symptoms: She has not reported any worsening symptoms at this time.  Continue to monitor for night sweats, weight loss.  And body aches.   4.  Paresthesias and peripheral neuropathy: She is currently on B12 supplements and laboratory testing in January 2022 showed normal B12 levels.   5.  Follow-up: In 2 months for a follow-up evaluation and repeat testing.   30   minutes were dedicated to this visit.  The time was spent on reviewing laboratory data, discussing treatment options and complications related to her diagnosis and therapy.     Zola Button, MD 3/9/20227:54 AM`

## 2020-12-03 NOTE — Telephone Encounter (Signed)
Scheduled follow-up appointment per 3/9 schedule message. Patient is aware. 

## 2020-12-26 ENCOUNTER — Encounter: Payer: Self-pay | Admitting: Oncology

## 2021-01-03 DIAGNOSIS — Z23 Encounter for immunization: Secondary | ICD-10-CM | POA: Diagnosis not present

## 2021-01-29 ENCOUNTER — Inpatient Hospital Stay: Payer: Medicare Other

## 2021-01-29 ENCOUNTER — Other Ambulatory Visit: Payer: Self-pay

## 2021-01-29 ENCOUNTER — Inpatient Hospital Stay: Payer: Medicare Other | Attending: Oncology | Admitting: Oncology

## 2021-01-29 VITALS — BP 127/58 | HR 69 | Temp 97.7°F | Resp 17 | Ht 62.0 in | Wt 137.5 lb

## 2021-01-29 DIAGNOSIS — D473 Essential (hemorrhagic) thrombocythemia: Secondary | ICD-10-CM | POA: Insufficient documentation

## 2021-01-29 DIAGNOSIS — Z880 Allergy status to penicillin: Secondary | ICD-10-CM | POA: Insufficient documentation

## 2021-01-29 DIAGNOSIS — Z7982 Long term (current) use of aspirin: Secondary | ICD-10-CM | POA: Diagnosis not present

## 2021-01-29 DIAGNOSIS — G629 Polyneuropathy, unspecified: Secondary | ICD-10-CM | POA: Insufficient documentation

## 2021-01-29 DIAGNOSIS — Z885 Allergy status to narcotic agent status: Secondary | ICD-10-CM | POA: Diagnosis not present

## 2021-01-29 DIAGNOSIS — E538 Deficiency of other specified B group vitamins: Secondary | ICD-10-CM

## 2021-01-29 DIAGNOSIS — D75839 Thrombocytosis, unspecified: Secondary | ICD-10-CM | POA: Diagnosis not present

## 2021-01-29 DIAGNOSIS — Z79899 Other long term (current) drug therapy: Secondary | ICD-10-CM | POA: Insufficient documentation

## 2021-01-29 DIAGNOSIS — Z888 Allergy status to other drugs, medicaments and biological substances status: Secondary | ICD-10-CM | POA: Diagnosis not present

## 2021-01-29 DIAGNOSIS — C946 Myelodysplastic disease, not classified: Secondary | ICD-10-CM | POA: Insufficient documentation

## 2021-01-29 LAB — CMP (CANCER CENTER ONLY)
ALT: 14 U/L (ref 0–44)
AST: 23 U/L (ref 15–41)
Albumin: 4.3 g/dL (ref 3.5–5.0)
Alkaline Phosphatase: 82 U/L (ref 38–126)
Anion gap: 9 (ref 5–15)
BUN: 14 mg/dL (ref 8–23)
CO2: 24 mmol/L (ref 22–32)
Calcium: 9.2 mg/dL (ref 8.9–10.3)
Chloride: 107 mmol/L (ref 98–111)
Creatinine: 1.04 mg/dL — ABNORMAL HIGH (ref 0.44–1.00)
GFR, Estimated: 56 mL/min — ABNORMAL LOW (ref >60)
Glucose, Bld: 93 mg/dL (ref 70–99)
Potassium: 4.1 mmol/L (ref 3.5–5.1)
Sodium: 140 mmol/L (ref 135–145)
Total Bilirubin: 0.5 mg/dL (ref 0.3–1.2)
Total Protein: 7.1 g/dL (ref 6.5–8.1)

## 2021-01-29 LAB — VITAMIN B12: Vitamin B-12: 1448 pg/mL — ABNORMAL HIGH (ref 180–914)

## 2021-01-29 LAB — CBC WITH DIFFERENTIAL (CANCER CENTER ONLY)
Abs Immature Granulocytes: 0.03 10*3/uL (ref 0.00–0.07)
Basophils Absolute: 0.1 10*3/uL (ref 0.0–0.1)
Basophils Relative: 1 %
Eosinophils Absolute: 0.1 10*3/uL (ref 0.0–0.5)
Eosinophils Relative: 2 %
HCT: 44.1 % (ref 36.0–46.0)
Hemoglobin: 15 g/dL (ref 12.0–15.0)
Immature Granulocytes: 0 %
Lymphocytes Relative: 24 %
Lymphs Abs: 1.9 10*3/uL (ref 0.7–4.0)
MCH: 37.6 pg — ABNORMAL HIGH (ref 26.0–34.0)
MCHC: 34 g/dL (ref 30.0–36.0)
MCV: 110.5 fL — ABNORMAL HIGH (ref 80.0–100.0)
Monocytes Absolute: 0.8 10*3/uL (ref 0.1–1.0)
Monocytes Relative: 10 %
Neutro Abs: 4.8 10*3/uL (ref 1.7–7.7)
Neutrophils Relative %: 63 %
Platelet Count: 729 10*3/uL — ABNORMAL HIGH (ref 150–400)
RBC: 3.99 MIL/uL (ref 3.87–5.11)
RDW: 15 % (ref 11.5–15.5)
WBC Count: 7.7 10*3/uL (ref 4.0–10.5)
nRBC: 0 % (ref 0.0–0.2)

## 2021-01-29 NOTE — Progress Notes (Signed)
Hematology and Oncology Follow Up Visit  Anna Fuller [id number] 05/08/1946 75 y.o. 01/29/2021 7:56 AM      Principle Diagnosis: 75 year old woman with essential thrombocythemia diagnosed in 2007.  She was found to have JAK2 positive myeloproliferative disorder.  Prior Therapy:   She is status post Anagrelide therapy discontinued due to do intolerance 2008.   Current therapy:   She started on hydroxyurea 2009.  She is currently on 1500 mg daily starting on 08/06/2016.  Interim History: Anna Fuller is here for a follow-up evaluation.  Since the last visit, she reports no major changes in her health.  She denies any recent hospitalization or illnesses.  She denies any excessive fatigue, tiredness or weakness.  She denies any new complications related to hydroxyurea.  He denies any thrombosis or bleeding episodes.        Medications: Unchanged on review. Current Outpatient Prescriptions  Medication Sig Dispense Refill  . aspirin 81 MG tablet Take 81 mg by mouth daily.        . cholecalciferol (VITAMIN D) 1000 UNITS tablet Take 2,000 Units by mouth daily.      Marland Kitchen estrogens, conjugated, (PREMARIN) 0.3 MG tablet Take 1 tablet (0.3 mg total) by mouth daily. Take daily for 21 days then do not take for 7 days.  30 tablet  11  . hydroxyurea (HYDREA) 500 MG capsule Take one tab twice a day.  Every other night take an additional tablet at night.  (Alternate 1000mg  and 1500mg ).  120 capsule  3  . simvastatin (ZOCOR) 40 MG tablet Take 1 tablet (40 mg total) by mouth at bedtime.  30 tablet  11         Allergies: Reviewed with the patient again today. Allergies  Allergen Reactions  . Codeine Nausea And Vomiting  . Iodine Anaphylaxis    Contrast dye  . Penicillins Other (See Comments)     Physical Exam:      Blood pressure (!) 127/58, pulse 69, temperature 97.7 F (36.5 C), temperature source Tympanic, resp. rate 17, height 5\' 2"  (1.575 m), weight 137 lb 8 oz (62.4 kg),  SpO2 98 %.     ECOG: 1    General appearance: Comfortable appearing without any discomfort Head: Normocephalic without any trauma Oropharynx: Mucous membranes are moist and pink without any thrush or ulcers. Eyes: Pupils are equal and round reactive to light. Lymph nodes: No cervical, supraclavicular, inguinal or axillary lymphadenopathy.   Heart:regular rate and rhythm.  S1 and S2 without leg edema. Lung: Clear without any rhonchi or wheezes.  No dullness to percussion. Abdomin: Soft, nontender, nondistended with good bowel sounds.  No hepatosplenomegaly. Musculoskeletal: No joint deformity or effusion.  Full range of motion noted. Neurological: No deficits noted on motor, sensory and deep tendon reflex exam. Skin: No petechial rash or dryness.  Appeared moist.                  Lab Results: Lab Results  Component Value Date   WBC 8.2 12/03/2020   HGB 15.9 (H) 12/03/2020   HCT 47.0 (H) 12/03/2020   MCV 111.1 (H) 12/03/2020   PLT 858 (H) 12/03/2020     Chemistry      Component Value Date/Time   NA 140 12/03/2020 0736   NA 140 09/28/2017 0811   K 4.0 12/03/2020 0736   K 3.8 09/28/2017 0811   CL 105 12/03/2020 0736   CL 105 01/30/2013 0825   CO2 24 12/03/2020 0736   CO2 25  09/28/2017 0811   BUN 17 12/03/2020 0736   BUN 14.6 09/28/2017 0811   CREATININE 1.13 (H) 12/03/2020 0736   CREATININE 1.15 (H) 11/18/2020 0911   CREATININE 0.9 09/28/2017 0811      Component Value Date/Time   CALCIUM 9.4 12/03/2020 0736   CALCIUM 9.1 09/28/2017 0811   ALKPHOS 82 12/03/2020 0736   ALKPHOS 75 09/28/2017 0811   AST 26 12/03/2020 0736   AST 23 09/28/2017 0811   ALT 21 12/03/2020 0736   ALT 23 09/28/2017 0811   BILITOT 0.5 12/03/2020 0736   BILITOT 0.46 09/28/2017 0811         Impression and Plan:  75 year old woman with:  1.  Essential thrombocythemia diagnosed in 2007.  She was found to have JAK2 positive myeloproliferative disorder.    Disease status  was updated at this time and treatment options were reviewed.  CBC from today showed a platelet count of 729 with normal white cell count and hemoglobin.  Risks and benefits of continuing this current dose versus increasing hydroxyurea were reviewed.  Her platelet count has declined and I recommended continuing the same dose and schedule.  She is agreeable at this time.   2.  Thrombosis prophylaxis: She is currently on aspirin without any bleeding or thrombosis episodes.  3.  Constitutional symptoms: Remains manageable without any exacerbation at this time.   4.  Paresthesias and peripheral neuropathy: Improved with B12 supplementation.   5.  Follow-up: She will return in 8 weeks for a follow-up evaluation.  30   minutes were spent on this encounter.  The time was dedicated to reviewing her disease status, discussing treatment options and addressing complications related to her diagnosis and treatment.   Zola Button, MD 5/5/20227:56 AM`

## 2021-03-16 DIAGNOSIS — H5213 Myopia, bilateral: Secondary | ICD-10-CM | POA: Diagnosis not present

## 2021-03-16 DIAGNOSIS — H353131 Nonexudative age-related macular degeneration, bilateral, early dry stage: Secondary | ICD-10-CM | POA: Diagnosis not present

## 2021-04-02 ENCOUNTER — Inpatient Hospital Stay: Payer: Medicare Other | Attending: Oncology

## 2021-04-02 ENCOUNTER — Other Ambulatory Visit: Payer: Self-pay

## 2021-04-02 ENCOUNTER — Inpatient Hospital Stay (HOSPITAL_BASED_OUTPATIENT_CLINIC_OR_DEPARTMENT_OTHER): Payer: Medicare Other | Admitting: Oncology

## 2021-04-02 VITALS — BP 132/60 | HR 71 | Temp 98.5°F | Resp 18 | Ht 62.0 in | Wt 137.5 lb

## 2021-04-02 DIAGNOSIS — G629 Polyneuropathy, unspecified: Secondary | ICD-10-CM | POA: Diagnosis not present

## 2021-04-02 DIAGNOSIS — Z885 Allergy status to narcotic agent status: Secondary | ICD-10-CM | POA: Diagnosis not present

## 2021-04-02 DIAGNOSIS — Z888 Allergy status to other drugs, medicaments and biological substances status: Secondary | ICD-10-CM | POA: Diagnosis not present

## 2021-04-02 DIAGNOSIS — D75839 Thrombocytosis, unspecified: Secondary | ICD-10-CM | POA: Insufficient documentation

## 2021-04-02 DIAGNOSIS — Z880 Allergy status to penicillin: Secondary | ICD-10-CM | POA: Diagnosis not present

## 2021-04-02 DIAGNOSIS — D473 Essential (hemorrhagic) thrombocythemia: Secondary | ICD-10-CM | POA: Insufficient documentation

## 2021-04-02 DIAGNOSIS — C946 Myelodysplastic disease, not classified: Secondary | ICD-10-CM | POA: Diagnosis not present

## 2021-04-02 DIAGNOSIS — E538 Deficiency of other specified B group vitamins: Secondary | ICD-10-CM

## 2021-04-02 DIAGNOSIS — Z79899 Other long term (current) drug therapy: Secondary | ICD-10-CM | POA: Insufficient documentation

## 2021-04-02 LAB — CBC WITH DIFFERENTIAL (CANCER CENTER ONLY)
Abs Immature Granulocytes: 0.03 10*3/uL (ref 0.00–0.07)
Basophils Absolute: 0.1 10*3/uL (ref 0.0–0.1)
Basophils Relative: 1 %
Eosinophils Absolute: 0.1 10*3/uL (ref 0.0–0.5)
Eosinophils Relative: 1 %
HCT: 43.1 % (ref 36.0–46.0)
Hemoglobin: 15 g/dL (ref 12.0–15.0)
Immature Granulocytes: 0 %
Lymphocytes Relative: 26 %
Lymphs Abs: 2.2 10*3/uL (ref 0.7–4.0)
MCH: 37.7 pg — ABNORMAL HIGH (ref 26.0–34.0)
MCHC: 34.8 g/dL (ref 30.0–36.0)
MCV: 108.3 fL — ABNORMAL HIGH (ref 80.0–100.0)
Monocytes Absolute: 0.8 10*3/uL (ref 0.1–1.0)
Monocytes Relative: 10 %
Neutro Abs: 5.1 10*3/uL (ref 1.7–7.7)
Neutrophils Relative %: 62 %
Platelet Count: 718 10*3/uL — ABNORMAL HIGH (ref 150–400)
RBC: 3.98 MIL/uL (ref 3.87–5.11)
RDW: 14.6 % (ref 11.5–15.5)
WBC Count: 8.3 10*3/uL (ref 4.0–10.5)
nRBC: 0 % (ref 0.0–0.2)

## 2021-04-02 LAB — CMP (CANCER CENTER ONLY)
ALT: 17 U/L (ref 0–44)
AST: 23 U/L (ref 15–41)
Albumin: 3.9 g/dL (ref 3.5–5.0)
Alkaline Phosphatase: 86 U/L (ref 38–126)
Anion gap: 10 (ref 5–15)
BUN: 13 mg/dL (ref 8–23)
CO2: 24 mmol/L (ref 22–32)
Calcium: 8.8 mg/dL — ABNORMAL LOW (ref 8.9–10.3)
Chloride: 105 mmol/L (ref 98–111)
Creatinine: 0.96 mg/dL (ref 0.44–1.00)
GFR, Estimated: >60 mL/min (ref >60)
Glucose, Bld: 91 mg/dL (ref 70–99)
Potassium: 4 mmol/L (ref 3.5–5.1)
Sodium: 139 mmol/L (ref 135–145)
Total Bilirubin: 0.5 mg/dL (ref 0.3–1.2)
Total Protein: 6.7 g/dL (ref 6.5–8.1)

## 2021-04-02 LAB — VITAMIN B12: Vitamin B-12: 1416 pg/mL — ABNORMAL HIGH (ref 180–914)

## 2021-04-02 NOTE — Progress Notes (Signed)
Hematology and Oncology Follow Up Visit  Anna Fuller [id number] 01-Nov-1945 75 y.o. 04/02/2021 7:29 AM      Principle Diagnosis: 75 year old woman with JAK2 positive essential thrombocythemia diagnosed in 2007.   Prior Therapy:   She is status post Anagrelide therapy discontinued due to do intolerance 2008.   Current therapy:   She started on hydroxyurea 2009.  She is currently on 1500 mg daily starting on 08/06/2016.  Interim History: Anna Fuller presents today for a follow-up visit.  Since the last visit, she reports no major changes in her health.  She denies any nausea, vomiting or abdominal pain.  She denies any bleeding or thrombosis episodes.  She continues to be on hydroxyurea without any major complaints at this time.  She still reports some occasional paresthesias and minor constitutional symptoms.  She denies any weight loss or fevers.  She does report some occasional night sweats.  Overall performance status quality of life not dramatically changed.        Medications: Updated on review. Current Outpatient Prescriptions  Medication Sig Dispense Refill   aspirin 81 MG tablet Take 81 mg by mouth daily.         cholecalciferol (VITAMIN D) 1000 UNITS tablet Take 2,000 Units by mouth daily.       estrogens, conjugated, (PREMARIN) 0.3 MG tablet Take 1 tablet (0.3 mg total) by mouth daily. Take daily for 21 days then do not take for 7 days.  30 tablet  11   hydroxyurea (HYDREA) 500 MG capsule Take one tab twice a day.  Every other night take an additional tablet at night.  (Alternate 1000mg  and 1500mg ).  120 capsule  3   simvastatin (ZOCOR) 40 MG tablet Take 1 tablet (40 mg total) by mouth at bedtime.  30 tablet  11         Allergies: Reviewed with the patient again today. Allergies  Allergen Reactions   Codeine Nausea And Vomiting   Iodine Anaphylaxis    Contrast dye   Penicillins Other (See Comments)     Physical Exam:       Blood pressure 132/60,  pulse 71, temperature 98.5 F (36.9 C), temperature source Oral, resp. rate 18, height 5\' 2"  (1.575 m), weight 137 lb 8 oz (62.4 kg), SpO2 98 %.     ECOG: 1    General appearance: Alert, awake without any distress. Head: Atraumatic without abnormalities Oropharynx: Without any thrush or ulcers. Eyes: No scleral icterus. Lymph nodes: No lymphadenopathy noted in the cervical, supraclavicular, or axillary nodes Heart:regular rate and rhythm, without any murmurs or gallops.   Lung: Clear to auscultation without any rhonchi, wheezes or dullness to percussion. Abdomin: Soft, nontender without any shifting dullness or ascites. Musculoskeletal: No clubbing or cyanosis. Neurological: No motor or sensory deficits. Skin: No rashes or lesions.                  Lab Results: Lab Results  Component Value Date   WBC 7.7 01/29/2021   HGB 15.0 01/29/2021   HCT 44.1 01/29/2021   MCV 110.5 (H) 01/29/2021   PLT 729 (H) 01/29/2021     Chemistry      Component Value Date/Time   NA 140 01/29/2021 0737   NA 140 09/28/2017 0811   K 4.1 01/29/2021 0737   K 3.8 09/28/2017 0811   CL 107 01/29/2021 0737   CL 105 01/30/2013 0825   CO2 24 01/29/2021 0737   CO2 25 09/28/2017 0811   BUN  14 01/29/2021 0737   BUN 14.6 09/28/2017 0811   CREATININE 1.04 (H) 01/29/2021 0737   CREATININE 1.15 (H) 11/18/2020 0911   CREATININE 0.9 09/28/2017 0811      Component Value Date/Time   CALCIUM 9.2 01/29/2021 0737   CALCIUM 9.1 09/28/2017 0811   ALKPHOS 82 01/29/2021 0737   ALKPHOS 75 09/28/2017 0811   AST 23 01/29/2021 0737   AST 23 09/28/2017 0811   ALT 14 01/29/2021 0737   ALT 23 09/28/2017 0811   BILITOT 0.5 01/29/2021 0737   BILITOT 0.46 09/28/2017 0811         Impression and Plan:  75 year old woman with:  1.  JAK2 positive myeloproliferative disorder presented with essential thrombocythemia in 2007.    The natural course of her disease was reviewed at this time and  treatment choices were discussed.  She continues to be on hydroxyurea with adequate platelet control at this time.  We have discussed previously changing her hydroxyurea dose which she has been reluctant to it because of side effects.  She has been on anagrelide in the past and she has tolerated it poorly.   Laboratory data from today reviewed and showed a platelet count remains adequate currently at 718.  I recommended continuing the same dose and schedule.   2.  Thrombosis prophylaxis: Risk of thrombosis remains low at this time.  Her white cell count and hemoglobin is adequately controlled.  3.  Constitutional symptoms: The role for Jak 2 inhibitors was also discussed if she develops worsening constitutional symptoms.   4.  Paresthesias and peripheral neuropathy: Currently on B12 supplementation with B12 levels are adequately replaced.  5.  Follow-up: In 2 months for repeat follow-up.  30   minutes were spent on this visit.  The time was dedicated to reviewing laboratory data, disease status update, treatment choices and future plan of care discussion.   Zola Button, MD 7/7/20227:29 AM`

## 2021-05-28 ENCOUNTER — Inpatient Hospital Stay: Payer: Medicare Other

## 2021-05-28 ENCOUNTER — Inpatient Hospital Stay: Payer: Medicare Other | Attending: Oncology | Admitting: Oncology

## 2021-05-28 ENCOUNTER — Other Ambulatory Visit: Payer: Self-pay

## 2021-05-28 VITALS — BP 131/61 | HR 64 | Temp 98.3°F | Resp 18 | Ht 62.0 in | Wt 138.1 lb

## 2021-05-28 DIAGNOSIS — D473 Essential (hemorrhagic) thrombocythemia: Secondary | ICD-10-CM | POA: Insufficient documentation

## 2021-05-28 DIAGNOSIS — R5383 Other fatigue: Secondary | ICD-10-CM | POA: Diagnosis not present

## 2021-05-28 DIAGNOSIS — Z885 Allergy status to narcotic agent status: Secondary | ICD-10-CM | POA: Insufficient documentation

## 2021-05-28 DIAGNOSIS — Z79899 Other long term (current) drug therapy: Secondary | ICD-10-CM | POA: Insufficient documentation

## 2021-05-28 DIAGNOSIS — Z880 Allergy status to penicillin: Secondary | ICD-10-CM | POA: Insufficient documentation

## 2021-05-28 DIAGNOSIS — D75839 Thrombocytosis, unspecified: Secondary | ICD-10-CM | POA: Diagnosis not present

## 2021-05-28 DIAGNOSIS — Z888 Allergy status to other drugs, medicaments and biological substances status: Secondary | ICD-10-CM | POA: Diagnosis not present

## 2021-05-28 DIAGNOSIS — C946 Myelodysplastic disease, not classified: Secondary | ICD-10-CM | POA: Insufficient documentation

## 2021-05-28 DIAGNOSIS — E538 Deficiency of other specified B group vitamins: Secondary | ICD-10-CM

## 2021-05-28 LAB — CBC WITH DIFFERENTIAL (CANCER CENTER ONLY)
Abs Immature Granulocytes: 0.04 10*3/uL (ref 0.00–0.07)
Basophils Absolute: 0.1 10*3/uL (ref 0.0–0.1)
Basophils Relative: 1 %
Eosinophils Absolute: 0.2 10*3/uL (ref 0.0–0.5)
Eosinophils Relative: 2 %
HCT: 43 % (ref 36.0–46.0)
Hemoglobin: 14.7 g/dL (ref 12.0–15.0)
Immature Granulocytes: 1 %
Lymphocytes Relative: 24 %
Lymphs Abs: 1.9 10*3/uL (ref 0.7–4.0)
MCH: 37.7 pg — ABNORMAL HIGH (ref 26.0–34.0)
MCHC: 34.2 g/dL (ref 30.0–36.0)
MCV: 110.3 fL — ABNORMAL HIGH (ref 80.0–100.0)
Monocytes Absolute: 0.8 10*3/uL (ref 0.1–1.0)
Monocytes Relative: 9 %
Neutro Abs: 5.2 10*3/uL (ref 1.7–7.7)
Neutrophils Relative %: 63 %
Platelet Count: 653 10*3/uL — ABNORMAL HIGH (ref 150–400)
RBC: 3.9 MIL/uL (ref 3.87–5.11)
RDW: 14.9 % (ref 11.5–15.5)
WBC Count: 8.2 10*3/uL (ref 4.0–10.5)
nRBC: 0 % (ref 0.0–0.2)

## 2021-05-28 LAB — CMP (CANCER CENTER ONLY)
ALT: 16 U/L (ref 0–44)
AST: 20 U/L (ref 15–41)
Albumin: 4 g/dL (ref 3.5–5.0)
Alkaline Phosphatase: 69 U/L (ref 38–126)
Anion gap: 9 (ref 5–15)
BUN: 11 mg/dL (ref 8–23)
CO2: 24 mmol/L (ref 22–32)
Calcium: 9.1 mg/dL (ref 8.9–10.3)
Chloride: 108 mmol/L (ref 98–111)
Creatinine: 1.04 mg/dL — ABNORMAL HIGH (ref 0.44–1.00)
GFR, Estimated: 56 mL/min — ABNORMAL LOW (ref >60)
Glucose, Bld: 91 mg/dL (ref 70–99)
Potassium: 4.1 mmol/L (ref 3.5–5.1)
Sodium: 141 mmol/L (ref 135–145)
Total Bilirubin: 0.5 mg/dL (ref 0.3–1.2)
Total Protein: 6.6 g/dL (ref 6.5–8.1)

## 2021-05-28 LAB — VITAMIN B12: Vitamin B-12: 948 pg/mL — ABNORMAL HIGH (ref 180–914)

## 2021-05-28 NOTE — Progress Notes (Signed)
Hematology and Oncology Follow Up Visit  Anna Fuller [id number] 1946-08-08 75 y.o. 05/28/2021 7:52 AM      Principle Diagnosis: 75 year old woman with myeloproliferative disorder diagnosed in 2007.  She was found to have JAK2 positive essential thrombocythemia presenting with high platelet count.  Prior Therapy:   She is status post Anagrelide therapy discontinued due to do intolerance 2008.   Current therapy:   She started on hydroxyurea 2009.  She is currently on 1500 mg daily starting on 08/06/2016.  Interim History: Anna Fuller returns today for repeat evaluation.  Since her last visit, she reports feeling well without any major complaints.  She continues to tolerate hydroxyurea without any increased symptoms.  She has reported some occasional fatigue and tiredness but no constitutional symptoms.  She denies abdominal pain, or satiety or fevers.  She denies any excessive sweats or weight loss.        Medications: Reviewed without changes. Current Outpatient Prescriptions  Medication Sig Dispense Refill   aspirin 81 MG tablet Take 81 mg by mouth daily.         cholecalciferol (VITAMIN D) 1000 UNITS tablet Take 2,000 Units by mouth daily.       estrogens, conjugated, (PREMARIN) 0.3 MG tablet Take 1 tablet (0.3 mg total) by mouth daily. Take daily for 21 days then do not take for 7 days.  30 tablet  11   hydroxyurea (HYDREA) 500 MG capsule Take one tab twice a day.  Every other night take an additional tablet at night.  (Alternate '1000mg'$  and '1500mg'$ ).  120 capsule  3   simvastatin (ZOCOR) 40 MG tablet Take 1 tablet (40 mg total) by mouth at bedtime.  30 tablet  11         Allergies: Reviewed with the patient again today. Allergies  Allergen Reactions   Codeine Nausea And Vomiting   Iodine Anaphylaxis    Contrast dye   Penicillins Other (See Comments)     Physical Exam:       Blood pressure 131/61, pulse 64, temperature 98.3 F (36.8 C), temperature  source Oral, resp. rate 18, height '5\' 2"'$  (1.575 m), weight 138 lb 1.6 oz (62.6 kg), SpO2 98 %.     ECOG: 1     General appearance: Comfortable appearing without any discomfort Head: Normocephalic without any trauma Oropharynx: Mucous membranes are moist and pink without any thrush or ulcers. Eyes: Pupils are equal and round reactive to light. Lymph nodes: No cervical, supraclavicular, inguinal or axillary lymphadenopathy.   Heart:regular rate and rhythm.  S1 and S2 without leg edema. Lung: Clear without any rhonchi or wheezes.  No dullness to percussion. Abdomin: Soft, nontender, nondistended with good bowel sounds.  No hepatosplenomegaly. Musculoskeletal: No joint deformity or effusion.  Full range of motion noted. Neurological: No deficits noted on motor, sensory and deep tendon reflex exam. Skin: No petechial rash or dryness.  Appeared moist.  .                  Lab Results: Lab Results  Component Value Date   WBC 8.3 04/02/2021   HGB 15.0 04/02/2021   HCT 43.1 04/02/2021   MCV 108.3 (H) 04/02/2021   PLT 718 (H) 04/02/2021     Chemistry      Component Value Date/Time   NA 139 04/02/2021 0749   NA 140 09/28/2017 0811   K 4.0 04/02/2021 0749   K 3.8 09/28/2017 0811   CL 105 04/02/2021 0749   CL 105  01/30/2013 0825   CO2 24 04/02/2021 0749   CO2 25 09/28/2017 0811   BUN 13 04/02/2021 0749   BUN 14.6 09/28/2017 0811   CREATININE 0.96 04/02/2021 0749   CREATININE 1.15 (H) 11/18/2020 0911   CREATININE 0.9 09/28/2017 0811      Component Value Date/Time   CALCIUM 8.8 (L) 04/02/2021 0749   CALCIUM 9.1 09/28/2017 0811   ALKPHOS 86 04/02/2021 0749   ALKPHOS 75 09/28/2017 0811   AST 23 04/02/2021 0749   AST 23 09/28/2017 0811   ALT 17 04/02/2021 0749   ALT 23 09/28/2017 0811   BILITOT 0.5 04/02/2021 0749   BILITOT 0.46 09/28/2017 0811         Impression and Plan:  75 year old woman with:  1.  Essential thrombocythemia diagnosed in 2007.   She presented with JAK2 positive myeloproliferative disorder.    Laboratory data from a today reviewed and showed a platelet count that is under control currently at 653.  Risks and benefits of continuing the current dose of hydroxyurea were reviewed.  Complications including pulmonary disease, GI toxicities and oral ulcers were reviewed.  He is agreeable to continue at this time.   2.  Thrombosis prophylaxis: White cell count remains low which indicates lower thrombosis risk.  3.  Constitutional symptoms: Very limited at this time and does not necessitate change for treatment.  Jak 2 inhibitors could be a possibility as salvage.   4.  Immunization update: We have discussed all immunization series including flu vaccine and possibly COVID-19 booster.  5.  Follow-up: In 2 months for repeat evaluation.  30   minutes were dedicated to this encounter.  The time was spent on reviewing laboratory data, disease status update and outlining future plan of care.   Anna Button, MD 9/1/20227:52 AM`

## 2021-06-05 ENCOUNTER — Ambulatory Visit (INDEPENDENT_AMBULATORY_CARE_PROVIDER_SITE_OTHER): Payer: Medicare Other | Admitting: Internal Medicine

## 2021-06-05 ENCOUNTER — Encounter: Payer: Self-pay | Admitting: Internal Medicine

## 2021-06-05 ENCOUNTER — Other Ambulatory Visit: Payer: Self-pay

## 2021-06-05 DIAGNOSIS — Z23 Encounter for immunization: Secondary | ICD-10-CM

## 2021-06-05 NOTE — Patient Instructions (Signed)
Flu vaccine given by CMA 

## 2021-06-05 NOTE — Progress Notes (Signed)
Nurse visit for flu vaccine, Flu vaccine given by Elizabeth Palau, CMA

## 2021-06-11 NOTE — Addendum Note (Signed)
Addended by: Amado Coe on: 06/11/2021 01:21 PM   Modules accepted: Level of Service

## 2021-06-25 ENCOUNTER — Telehealth: Payer: Self-pay | Admitting: Internal Medicine

## 2021-06-25 NOTE — Telephone Encounter (Signed)
Anna Fuller [phone]  Synai called to say she has a wart on her Right Thumb that is growing, she has been treating it with over counter stuff, not helping. Can you remove or should she go to dermatology?

## 2021-06-25 NOTE — Telephone Encounter (Signed)
Called to let patient know she will need to go to Dermatology, she verbalized understanding

## 2021-07-03 DIAGNOSIS — Z23 Encounter for immunization: Secondary | ICD-10-CM | POA: Diagnosis not present

## 2021-07-07 ENCOUNTER — Other Ambulatory Visit: Payer: Self-pay | Admitting: Internal Medicine

## 2021-07-07 DIAGNOSIS — Z1231 Encounter for screening mammogram for malignant neoplasm of breast: Secondary | ICD-10-CM

## 2021-07-08 DIAGNOSIS — Z23 Encounter for immunization: Secondary | ICD-10-CM | POA: Diagnosis not present

## 2021-07-08 DIAGNOSIS — L820 Inflamed seborrheic keratosis: Secondary | ICD-10-CM | POA: Diagnosis not present

## 2021-07-30 ENCOUNTER — Inpatient Hospital Stay (HOSPITAL_BASED_OUTPATIENT_CLINIC_OR_DEPARTMENT_OTHER): Payer: Medicare Other | Admitting: Oncology

## 2021-07-30 ENCOUNTER — Inpatient Hospital Stay: Payer: Medicare Other | Attending: Oncology

## 2021-07-30 ENCOUNTER — Other Ambulatory Visit: Payer: Self-pay

## 2021-07-30 VITALS — BP 142/66 | HR 79 | Temp 97.3°F | Resp 19 | Ht 62.0 in | Wt 136.7 lb

## 2021-07-30 DIAGNOSIS — M791 Myalgia, unspecified site: Secondary | ICD-10-CM | POA: Insufficient documentation

## 2021-07-30 DIAGNOSIS — Z79899 Other long term (current) drug therapy: Secondary | ICD-10-CM | POA: Diagnosis not present

## 2021-07-30 DIAGNOSIS — D473 Essential (hemorrhagic) thrombocythemia: Secondary | ICD-10-CM | POA: Diagnosis not present

## 2021-07-30 DIAGNOSIS — Z885 Allergy status to narcotic agent status: Secondary | ICD-10-CM | POA: Insufficient documentation

## 2021-07-30 DIAGNOSIS — D471 Chronic myeloproliferative disease: Secondary | ICD-10-CM | POA: Insufficient documentation

## 2021-07-30 DIAGNOSIS — D75839 Thrombocytosis, unspecified: Secondary | ICD-10-CM | POA: Diagnosis not present

## 2021-07-30 DIAGNOSIS — E538 Deficiency of other specified B group vitamins: Secondary | ICD-10-CM

## 2021-07-30 DIAGNOSIS — M898X9 Other specified disorders of bone, unspecified site: Secondary | ICD-10-CM | POA: Insufficient documentation

## 2021-07-30 DIAGNOSIS — Z888 Allergy status to other drugs, medicaments and biological substances status: Secondary | ICD-10-CM | POA: Diagnosis not present

## 2021-07-30 DIAGNOSIS — Z880 Allergy status to penicillin: Secondary | ICD-10-CM | POA: Insufficient documentation

## 2021-07-30 LAB — CBC WITH DIFFERENTIAL (CANCER CENTER ONLY)
Abs Immature Granulocytes: 0.07 10*3/uL (ref 0.00–0.07)
Basophils Absolute: 0.1 10*3/uL (ref 0.0–0.1)
Basophils Relative: 1 %
Eosinophils Absolute: 0.2 10*3/uL (ref 0.0–0.5)
Eosinophils Relative: 2 %
HCT: 38.8 % (ref 36.0–46.0)
Hemoglobin: 13.1 g/dL (ref 12.0–15.0)
Immature Granulocytes: 1 %
Lymphocytes Relative: 17 %
Lymphs Abs: 2.1 10*3/uL (ref 0.7–4.0)
MCH: 37.1 pg — ABNORMAL HIGH (ref 26.0–34.0)
MCHC: 33.8 g/dL (ref 30.0–36.0)
MCV: 109.9 fL — ABNORMAL HIGH (ref 80.0–100.0)
Monocytes Absolute: 1.1 10*3/uL — ABNORMAL HIGH (ref 0.1–1.0)
Monocytes Relative: 9 %
Neutro Abs: 8.4 10*3/uL — ABNORMAL HIGH (ref 1.7–7.7)
Neutrophils Relative %: 70 %
Platelet Count: 850 10*3/uL — ABNORMAL HIGH (ref 150–400)
RBC: 3.53 MIL/uL — ABNORMAL LOW (ref 3.87–5.11)
RDW: 14.6 % (ref 11.5–15.5)
WBC Count: 11.9 10*3/uL — ABNORMAL HIGH (ref 4.0–10.5)
nRBC: 0 % (ref 0.0–0.2)

## 2021-07-30 LAB — CMP (CANCER CENTER ONLY)
ALT: 7 U/L (ref 0–44)
AST: 15 U/L (ref 15–41)
Albumin: 3.4 g/dL — ABNORMAL LOW (ref 3.5–5.0)
Alkaline Phosphatase: 108 U/L (ref 38–126)
Anion gap: 11 (ref 5–15)
BUN: 20 mg/dL (ref 8–23)
CO2: 24 mmol/L (ref 22–32)
Calcium: 9 mg/dL (ref 8.9–10.3)
Chloride: 104 mmol/L (ref 98–111)
Creatinine: 0.96 mg/dL (ref 0.44–1.00)
GFR, Estimated: >60 mL/min (ref >60)
Glucose, Bld: 96 mg/dL (ref 70–99)
Potassium: 3.7 mmol/L (ref 3.5–5.1)
Sodium: 139 mmol/L (ref 135–145)
Total Bilirubin: 0.4 mg/dL (ref 0.3–1.2)
Total Protein: 6.9 g/dL (ref 6.5–8.1)

## 2021-07-30 LAB — VITAMIN B12: Vitamin B-12: 1172 pg/mL — ABNORMAL HIGH (ref 180–914)

## 2021-07-30 NOTE — Progress Notes (Signed)
Hematology and Oncology Follow Up Visit  Anna Fuller [id number] 1945/12/13 75 y.o. 07/30/2021 7:37 AM      Principle Diagnosis: 75 year old woman with essential thrombocythemia diagnosed in 2007.  She was found to have JAK2 positive mutation myeloproliferative disorder.  Prior Therapy:   She is status post Anagrelide therapy discontinued due to do intolerance 2008.   Current therapy:   She started on hydroxyurea 2009.  She is currently on 1500 mg daily starting on 08/06/2016.  Interim History: Anna Fuller returns today for a follow-up visit.  Since the last visit, she reports a few complaints.  In the last 6 weeks she has noticed increase body pains that have been diffuse and vague in nature.  She is describing it predominantly bone pain without stiffness or joint discomfort.  She is still able to ambulate and drive but have difficulty performing certain tasks because of the pain.  She did have some relief to nonsteroidal anti-inflammatories.  She denies any fevers, chills or sweats.  She denies any weight loss.  She denies any recent illnesses or hospitalizations.  She has received COVID and flu vaccination.  She denies any worsening neuropathy.        Medications: Updated on review. Current Outpatient Prescriptions  Medication Sig Dispense Refill   aspirin 81 MG tablet Take 81 mg by mouth daily.         cholecalciferol (VITAMIN D) 1000 UNITS tablet Take 2,000 Units by mouth daily.       estrogens, conjugated, (PREMARIN) 0.3 MG tablet Take 1 tablet (0.3 mg total) by mouth daily. Take daily for 21 days then do not take for 7 days.  30 tablet  11   hydroxyurea (HYDREA) 500 MG capsule Take one tab twice a day.  Every other night take an additional tablet at night.  (Alternate 1000mg  and 1500mg ).  120 capsule  3   simvastatin (ZOCOR) 40 MG tablet Take 1 tablet (40 mg total) by mouth at bedtime.  30 tablet  11         Allergies: Reviewed with the patient again  today. Allergies  Allergen Reactions   Codeine Nausea And Vomiting   Iodine Anaphylaxis    Contrast dye   Penicillins Other (See Comments)     Physical Exam:        Blood pressure (!) 142/66, pulse 79, temperature (!) 97.3 F (36.3 C), temperature source Temporal, resp. rate 19, height 5\' 2"  (1.575 m), weight 136 lb 11.2 oz (62 kg), SpO2 98 %.     ECOG: 1    General appearance: Alert, awake without any distress. Head: Atraumatic without abnormalities Oropharynx: Without any thrush or ulcers. Eyes: No scleral icterus. Lymph nodes: No lymphadenopathy noted in the cervical, supraclavicular, or axillary nodes Heart:regular rate and rhythm, without any murmurs or gallops.   Lung: Clear to auscultation without any rhonchi, wheezes or dullness to percussion. Abdomin: Soft, nontender without any shifting dullness or ascites. Musculoskeletal: No clubbing or cyanosis. Neurological: No motor or sensory deficits. Skin: No rashes or lesions.                   Lab Results: Lab Results  Component Value Date   WBC 8.2 05/28/2021   HGB 14.7 05/28/2021   HCT 43.0 05/28/2021   MCV 110.3 (H) 05/28/2021   PLT 653 (H) 05/28/2021     Chemistry      Component Value Date/Time   NA 141 05/28/2021 0741   NA 140 09/28/2017 9509  K 4.1 05/28/2021 0741   K 3.8 09/28/2017 0811   CL 108 05/28/2021 0741   CL 105 01/30/2013 0825   CO2 24 05/28/2021 0741   CO2 25 09/28/2017 0811   BUN 11 05/28/2021 0741   BUN 14.6 09/28/2017 0811   CREATININE 1.04 (H) 05/28/2021 0741   CREATININE 1.15 (H) 11/18/2020 0911   CREATININE 0.9 09/28/2017 0811      Component Value Date/Time   CALCIUM 9.1 05/28/2021 0741   CALCIUM 9.1 09/28/2017 0811   ALKPHOS 69 05/28/2021 0741   ALKPHOS 75 09/28/2017 0811   AST 20 05/28/2021 0741   AST 23 09/28/2017 0811   ALT 16 05/28/2021 0741   ALT 23 09/28/2017 0811   BILITOT 0.5 05/28/2021 0741   BILITOT 0.46 09/28/2017 0811          Impression and Plan:  75 year old woman with:  1.  JAK2 positive myeloproliferative disorder presented with essential thrombocythemia take in 2007.    She is currently on hydroxyurea and overall manageable toxicity. The natural course of this disease and other complications related to treatment and her disease were reiterated.  CBC from today was reviewed and showed mild elevation in her platelet and white cell count.  Treatment choices moving forward were discussed.  Increasing hydroxyurea dosing versus switching to a different agent were discussed.  Given the mild elevation at this time, I do not see any urgent need to make changes.   2.  Thrombosis prophylaxis: Her thrombosis risk remains low with platelet and that white cell count overall stable.  3.  Myalgias and body aches: Unclear etiology at this time.  The differential diagnosis includes constitutional symptoms are related to her myelofibrosis versus autoimmune disorder.  I recommended rheumatological work-up at this time to rule out autoimmune disease in the interim.  If her work-up is negative and we feel by diagnosis of exclusion that this is related to her myeloproliferative disorder, treatment with Jak 2 inhibitor with Shanon Brow could be considered.  In the meantime I recommended trying Tylenol for pain.  She will be in touch with his her primary care physician for an evaluation and possible referral to rheumatology.   4.  Immunization update: He is up-to-date on her immunization including flu and COVID booster.  5.  Follow-up: She will return in 8 weeks for a follow-up visit.  30   minutes were spent on this visit.  The time was dedicated to reviewing laboratory data, disease status update, addressing complications related to her condition and treatment.   Zola Button, MD 11/3/20227:37 AM`

## 2021-08-06 ENCOUNTER — Encounter: Payer: Self-pay | Admitting: Oncology

## 2021-08-06 ENCOUNTER — Other Ambulatory Visit: Payer: Medicare Other | Admitting: Internal Medicine

## 2021-08-06 ENCOUNTER — Other Ambulatory Visit: Payer: Self-pay | Admitting: Oncology

## 2021-08-06 ENCOUNTER — Other Ambulatory Visit: Payer: Self-pay

## 2021-08-06 DIAGNOSIS — M898X9 Other specified disorders of bone, unspecified site: Secondary | ICD-10-CM | POA: Diagnosis not present

## 2021-08-06 DIAGNOSIS — M2550 Pain in unspecified joint: Secondary | ICD-10-CM

## 2021-08-06 MED ORDER — TRAMADOL HCL 50 MG PO TABS: 50.0000 mg | ORAL_TABLET | Freq: Four times a day (QID) | ORAL | 0 refills | Status: DC | PRN

## 2021-08-10 ENCOUNTER — Other Ambulatory Visit: Payer: Self-pay

## 2021-08-10 ENCOUNTER — Encounter: Payer: Self-pay | Admitting: Internal Medicine

## 2021-08-10 ENCOUNTER — Ambulatory Visit (INDEPENDENT_AMBULATORY_CARE_PROVIDER_SITE_OTHER): Payer: Medicare Other | Admitting: Internal Medicine

## 2021-08-10 VITALS — BP 138/78 | HR 78 | Temp 97.0°F | Ht 62.0 in | Wt 136.0 lb

## 2021-08-10 DIAGNOSIS — R700 Elevated erythrocyte sedimentation rate: Secondary | ICD-10-CM | POA: Diagnosis not present

## 2021-08-10 DIAGNOSIS — D473 Essential (hemorrhagic) thrombocythemia: Secondary | ICD-10-CM | POA: Diagnosis not present

## 2021-08-10 DIAGNOSIS — M898X9 Other specified disorders of bone, unspecified site: Secondary | ICD-10-CM | POA: Diagnosis not present

## 2021-08-10 DIAGNOSIS — Z1589 Genetic susceptibility to other disease: Secondary | ICD-10-CM | POA: Diagnosis not present

## 2021-08-10 LAB — RHEUMATOID FACTOR: Rheumatoid fact SerPl-aCnc: <14 IU/mL (ref <14)

## 2021-08-10 LAB — CYCLIC CITRUL PEPTIDE ANTIBODY, IGG: Cyclic Citrullin Peptide Ab: <16 UNITS

## 2021-08-10 LAB — ANA: Anti Nuclear Antibody (ANA): NEGATIVE

## 2021-08-10 LAB — SEDIMENTATION RATE: Sed Rate: 65 mm/h — ABNORMAL HIGH (ref 0–30)

## 2021-08-10 MED ORDER — METHYLPREDNISOLONE ACETATE 80 MG/ML IJ SUSP
40.0000 mg | Freq: Once | INTRAMUSCULAR | Status: AC
Start: 2021-08-10 — End: 2021-08-10
  Administered 2021-08-10: 40 mg via INTRAMUSCULAR

## 2021-08-10 NOTE — Progress Notes (Signed)
   Subjective:    Patient ID: Anna Fuller, female    DOB: August 31, 1946, 75 y.o.   MRN: [id number]  HPI 75 year old Female for evaluation of musculoskeletal pain.  She is under the care of Dr. Alen Blew, Oncologist for myeloproliferative disorder JAK2 positive with essential thrombocytopenia.  Currently on hydroxyurea.  Thought to be low risk for thrombosis.  Condition was diagnosed in 2007.  Presented with high platelet count.  Was treated with anagrelide but that was discontinued due to intolerance in 2008.  She resides alone and is single.  She has a sister who lives in the area.  She is retired.  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  Does not smoke or consume alcohol.  Enjoys walking and doing yard work.  She has a history of hyperlipidemia treated with simvastatin.  Has not had issues with simvastatin previously.  It is not clear if this is muscular pain or bone pain.  She simply says she hurts all over and is miserable.  We drew several rheumatology studies including sed rate which was elevated at 65 and a year ago was 2.  CCP was negative at less than 16.  Rheumatoid factor less than 14.  ANA was negative.  Platelet count currently 850,000.  MCV 109.9 white blood cell count 11,900 and hemoglobin 13.1 g.  BUN and creatinine are normal.  Liver functions are normal. Review of Systems patient says she had bad reaction to prednisone in the remote past.      Objective:   Physical Exam Blood pressure 138/78 pulse 78 pulse oximetry 98% weight 136 pounds  Skin warm and dry.  No cervical adenopathy.  Chest clear.  Cardiac exam regular rate and rhythm.  Do not appreciate any hot or swollen joints today.  Good range of motion in her joints.  However she looks fatigued and it is clear she is not feeling well.       Assessment & Plan:  Elevation sedimentation rate of 65 and last year was 2-?  Polymyalgia rheumatica.  Other  rheumatology studies appear to be negative.  Essential thrombocytosis treated by Dr. Alen Blew currently with hydroxyurea.  Did not tolerate anagrelide  Plan: We have discussed this at length.  She is clearly in considerable amount of pain.  Would like for rheumatology to evaluate her as soon as possible.  Recommend that she stop statin medication.  We gave her Depo-Medrol 80 mg IM in the office today.  Gave her tramadol to take sparingly for pain.  She does not tolerate hydrocodone.

## 2021-08-10 NOTE — Patient Instructions (Addendum)
Stop Statin med- not sure this will help but can try. Depomedrol 80 mg IM  Rheumatology consult. Take Tramadol sparingly. Does not tolerate Hydrocodone.

## 2021-08-12 ENCOUNTER — Encounter: Payer: Self-pay | Admitting: Internal Medicine

## 2021-08-16 ENCOUNTER — Encounter: Payer: Self-pay | Admitting: Oncology

## 2021-08-16 ENCOUNTER — Encounter: Payer: Self-pay | Admitting: Internal Medicine

## 2021-08-17 ENCOUNTER — Other Ambulatory Visit: Payer: Self-pay | Admitting: Oncology

## 2021-08-17 ENCOUNTER — Encounter: Payer: Self-pay | Admitting: Internal Medicine

## 2021-08-17 DIAGNOSIS — D473 Essential (hemorrhagic) thrombocythemia: Secondary | ICD-10-CM

## 2021-08-17 DIAGNOSIS — R202 Paresthesia of skin: Secondary | ICD-10-CM

## 2021-08-17 DIAGNOSIS — M898X9 Other specified disorders of bone, unspecified site: Secondary | ICD-10-CM

## 2021-08-18 ENCOUNTER — Other Ambulatory Visit: Payer: Self-pay | Admitting: Oncology

## 2021-08-18 ENCOUNTER — Telehealth: Payer: Self-pay | Admitting: Oncology

## 2021-08-18 NOTE — Telephone Encounter (Signed)
Scheduled per sch msg. Called and left msg  

## 2021-08-22 ENCOUNTER — Encounter: Payer: Self-pay | Admitting: Internal Medicine

## 2021-08-22 ENCOUNTER — Encounter: Payer: Self-pay | Admitting: Oncology

## 2021-08-22 ENCOUNTER — Telehealth: Payer: Self-pay | Admitting: Internal Medicine

## 2021-08-22 MED ORDER — HYDROMORPHONE HCL 2 MG PO TABS: ORAL_TABLET | ORAL | 0 refills | Status: DC

## 2021-08-22 NOTE — Telephone Encounter (Signed)
I called patient just now. She says she is having severe pain throughout her body and has a PET scan scheduled for Monday. Tramadol is not helping. Says she cannot tolerate Hydrocodone or Oxycodone. Says she is miserable because of the pain. Does not want to go to ED.  We talked about options. We talked about her calling the Oncologist on call. We discussed taking Dilaudid. She agrees to trying this.  Sending small quantity in to try to get through the holiday weekend. Her sister lives in the area and she has good neighbors close by that can take her to pharmacy.  Send in Dilaudid 2 mg tabs # 10 tabs one by mouth twice daily with a meal.  MJB, MD

## 2021-08-24 ENCOUNTER — Ambulatory Visit (HOSPITAL_COMMUNITY)
Admission: RE | Admit: 2021-08-24 | Discharge: 2021-08-24 | Disposition: A | Discharge status: Home / Self Care | Payer: Medicare Other | Source: Ambulatory Visit | Attending: Oncology | Admitting: Oncology

## 2021-08-24 ENCOUNTER — Other Ambulatory Visit: Payer: Self-pay | Admitting: Oncology

## 2021-08-24 ENCOUNTER — Ambulatory Visit: Payer: Medicare Other

## 2021-08-24 DIAGNOSIS — D473 Essential (hemorrhagic) thrombocythemia: Secondary | ICD-10-CM | POA: Diagnosis not present

## 2021-08-24 DIAGNOSIS — K5790 Diverticulosis of intestine, part unspecified, without perforation or abscess without bleeding: Secondary | ICD-10-CM | POA: Diagnosis not present

## 2021-08-24 DIAGNOSIS — R202 Paresthesia of skin: Secondary | ICD-10-CM

## 2021-08-24 DIAGNOSIS — J439 Emphysema, unspecified: Secondary | ICD-10-CM | POA: Diagnosis not present

## 2021-08-24 DIAGNOSIS — D471 Chronic myeloproliferative disease: Secondary | ICD-10-CM | POA: Insufficient documentation

## 2021-08-24 DIAGNOSIS — M898X9 Other specified disorders of bone, unspecified site: Secondary | ICD-10-CM | POA: Insufficient documentation

## 2021-08-24 DIAGNOSIS — D696 Thrombocytopenia, unspecified: Secondary | ICD-10-CM | POA: Diagnosis not present

## 2021-08-24 LAB — GLUCOSE, CAPILLARY: Glucose-Capillary: 93 mg/dL (ref 70–99)

## 2021-08-24 MED ORDER — METHYLPREDNISOLONE 4 MG PO TBPK: ORAL_TABLET | ORAL | 0 refills | Status: DC

## 2021-08-24 MED ORDER — FLUDEOXYGLUCOSE F - 18 (FDG) INJECTION
6.5000 | Freq: Once | INTRAVENOUS | Status: AC | PRN
  Administered 2021-08-24: 07:00:00 6.07 via INTRAVENOUS

## 2021-08-28 ENCOUNTER — Other Ambulatory Visit: Payer: Self-pay | Admitting: Internal Medicine

## 2021-08-28 ENCOUNTER — Inpatient Hospital Stay: Payer: Medicare Other | Attending: Oncology | Admitting: Oncology

## 2021-08-28 ENCOUNTER — Other Ambulatory Visit: Payer: Self-pay

## 2021-08-28 VITALS — BP 138/69 | HR 72 | Temp 97.8°F | Resp 18 | Ht 62.0 in | Wt 124.6 lb

## 2021-08-28 DIAGNOSIS — D471 Chronic myeloproliferative disease: Secondary | ICD-10-CM | POA: Diagnosis not present

## 2021-08-28 DIAGNOSIS — Z79899 Other long term (current) drug therapy: Secondary | ICD-10-CM | POA: Diagnosis not present

## 2021-08-28 DIAGNOSIS — M2550 Pain in unspecified joint: Secondary | ICD-10-CM | POA: Insufficient documentation

## 2021-08-28 DIAGNOSIS — M791 Myalgia, unspecified site: Secondary | ICD-10-CM | POA: Insufficient documentation

## 2021-08-28 DIAGNOSIS — D75839 Thrombocytosis, unspecified: Secondary | ICD-10-CM | POA: Insufficient documentation

## 2021-08-28 DIAGNOSIS — M353 Polymyalgia rheumatica: Secondary | ICD-10-CM

## 2021-08-28 DIAGNOSIS — Z880 Allergy status to penicillin: Secondary | ICD-10-CM | POA: Diagnosis not present

## 2021-08-28 DIAGNOSIS — Z885 Allergy status to narcotic agent status: Secondary | ICD-10-CM | POA: Diagnosis not present

## 2021-08-28 DIAGNOSIS — J439 Emphysema, unspecified: Secondary | ICD-10-CM | POA: Insufficient documentation

## 2021-08-28 DIAGNOSIS — D473 Essential (hemorrhagic) thrombocythemia: Secondary | ICD-10-CM | POA: Insufficient documentation

## 2021-08-28 DIAGNOSIS — Z888 Allergy status to other drugs, medicaments and biological substances status: Secondary | ICD-10-CM | POA: Diagnosis not present

## 2021-08-28 MED ORDER — METHYLPREDNISOLONE 4 MG PO TBPK: ORAL_TABLET | ORAL | 0 refills | Status: DC

## 2021-08-28 NOTE — Addendum Note (Signed)
Addended by: Wyatt Portela on: 08/28/2021 12:57 PM   Modules accepted: Orders

## 2021-08-28 NOTE — Progress Notes (Signed)
Hematology and Oncology Follow Up Visit  Anna Fuller [id number] 1946/01/04 75 y.o. 08/28/2021 12:17 PM      Principle Diagnosis: 75 year old woman with JAK2 positive myeloproliferative disorder diagnosed in 2007.  She was found to have essential thrombocythemia at that time.  Prior Therapy:   She is status post Anagrelide therapy discontinued due to do intolerance 2008.   Current therapy:   She started on hydroxyurea 2009.  She is currently on 1500 mg daily starting on 08/06/2016.  Interim History: Anna Fuller presents today for repeat follow-up.  Since the last visit, she has experienced diffuse arthralgias and myalgias which has been debilitating for her at this time.  She was started on Medrol Dosepak in the last few days.  She is also prescribed Dilaudid by Dr. Renold Genta.  Since starting her Medrol Dosepak she did notice significant improvement in her symptoms with pain improving but has not resolved.  She is able to ambulate without any major difficulties and her shoulder pain has improved.        Medications: Reviewed without changes. Current Outpatient Prescriptions  Medication Sig Dispense Refill   aspirin 81 MG tablet Take 81 mg by mouth daily.         cholecalciferol (VITAMIN D) 1000 UNITS tablet Take 2,000 Units by mouth daily.       estrogens, conjugated, (PREMARIN) 0.3 MG tablet Take 1 tablet (0.3 mg total) by mouth daily. Take daily for 21 days then do not take for 7 days.  30 tablet  11   hydroxyurea (HYDREA) 500 MG capsule Take one tab twice a day.  Every other night take an additional tablet at night.  (Alternate 1056m and 15038m.  120 capsule  3   simvastatin (ZOCOR) 40 MG tablet Take 1 tablet (40 mg total) by mouth at bedtime.  30 tablet  11         Allergies: Reviewed with the patient again today. Allergies  Allergen Reactions   Codeine Nausea And Vomiting   Iodine Anaphylaxis    Contrast dye   Penicillins Other (See Comments)     Physical  Exam:        Blood pressure 138/69, pulse 72, temperature 97.8 F (36.6 C), temperature source Temporal, resp. rate 18, height 5' 2"  (1.575 m), weight 124 lb 9.6 oz (56.5 kg), SpO2 100 %.      ECOG: 1     General appearance: Comfortable appearing without any discomfort Head: Normocephalic without any trauma Oropharynx: Mucous membranes are moist and pink without any thrush or ulcers. Eyes: Pupils are equal and round reactive to light. Lymph nodes: No cervical, supraclavicular, inguinal or axillary lymphadenopathy.   Heart:regular rate and rhythm.  S1 and S2 without leg edema. Lung: Clear without any rhonchi or wheezes.  No dullness to percussion. Abdomin: Soft, nontender, nondistended with good bowel sounds.  No hepatosplenomegaly. Musculoskeletal: No joint deformity or effusion.  Full range of motion noted. Neurological: No deficits noted on motor, sensory and deep tendon reflex exam. Skin: No petechial rash or dryness.  Appeared moist.                     Lab Results: Lab Results  Component Value Date   WBC 11.9 (H) 07/30/2021   HGB 13.1 07/30/2021   HCT 38.8 07/30/2021   MCV 109.9 (H) 07/30/2021   PLT 850 (H) 07/30/2021     Chemistry      Component Value Date/Time   NA 139 07/30/2021 0747  NA 140 09/28/2017 0811   K 3.7 07/30/2021 0747   K 3.8 09/28/2017 0811   CL 104 07/30/2021 0747   CL 105 01/30/2013 0825   CO2 24 07/30/2021 0747   CO2 25 09/28/2017 0811   BUN 20 07/30/2021 0747   BUN 14.6 09/28/2017 0811   CREATININE 0.96 07/30/2021 0747   CREATININE 1.15 (H) 11/18/2020 0911   CREATININE 0.9 09/28/2017 0811      Component Value Date/Time   CALCIUM 9.0 07/30/2021 0747   CALCIUM 9.1 09/28/2017 0811   ALKPHOS 108 07/30/2021 0747   ALKPHOS 75 09/28/2017 0811   AST 15 07/30/2021 0747   AST 23 09/28/2017 0811   ALT 7 07/30/2021 0747   ALT 23 09/28/2017 0811   BILITOT 0.4 07/30/2021 0747   BILITOT 0.46 09/28/2017 0811        IMPRESSION: 1. Mottled uptake identified in the ribs and thoracolumbar spine, nonspecific. No underlying bony abnormality on CT imaging. There is also relatively diffuse uptake in the region of the shoulders and hips bilaterally, potentially related to underlying joint inflammation. 2. Cholecystectomy with mild biliary dilatation, likely postsurgical. Correlation with liver function tests may prove helpful. 3.  Emphysema. (ICD10-J43.9) 4.  Aortic Atherosclerois (ICD10-170.0)  Impression and Plan:  75 year old woman with:  1.  Arthralgias and myalgias: Laboratory data obtained by Dr. Renold Genta were reviewed and did show high sedimentation rate and presentation more consistent with polymyalgia rheumatica.  She did notice improvement with steroid therapy at this time.  Her ANA and rheumatoid factor are negative.  At this time, I recommended she continues the Medrol Dosepak and referral to rheumatology will be sent.  2.  JAK2 positive essential thrombocythemia diagnosed in 2007.   She will have a repeat bone marrow biopsy in the near future to ensure no other hematological etiology of her symptoms.  For the time being she will continue hydroxyurea.     2.  Thrombosis prophylaxis: Thrombosis risk remains low at this time.    3.  Follow-up: In 4 weeks for repeat follow-up.  30   minutes were dedicated to this visit.  The time was spent on reviewing laboratory data, discussing differential diagnosis and management choices   Zola Button, MD 12/2/202212:17 PM`

## 2021-08-31 ENCOUNTER — Other Ambulatory Visit: Payer: Self-pay | Admitting: Oncology

## 2021-08-31 ENCOUNTER — Telehealth: Payer: Self-pay | Admitting: *Deleted

## 2021-08-31 ENCOUNTER — Encounter: Payer: Self-pay | Admitting: Oncology

## 2021-08-31 MED ORDER — PREDNISONE 20 MG PO TABS: 40.0000 mg | ORAL_TABLET | Freq: Every day | ORAL | 1 refills | Status: DC

## 2021-08-31 NOTE — Telephone Encounter (Signed)
Referral faxed to [facility]

## 2021-08-31 NOTE — Progress Notes (Signed)
Updates from the patient noted.  She continues to have increase in arthralgia after continuing a Medrol Dosepak which is likely consistent with PMR.  I have asked her to switch to continuous dose prednisone at 40 mg daily till she sees rheumatology.  A slower taper can be attempted at that time given her poor tolerance to a quick taper.

## 2021-09-02 ENCOUNTER — Encounter: Payer: Self-pay | Admitting: Internal Medicine

## 2021-09-02 ENCOUNTER — Encounter: Payer: Self-pay | Admitting: Oncology

## 2021-09-03 DIAGNOSIS — R5383 Other fatigue: Secondary | ICD-10-CM | POA: Diagnosis not present

## 2021-09-03 DIAGNOSIS — D471 Chronic myeloproliferative disease: Secondary | ICD-10-CM | POA: Diagnosis not present

## 2021-09-03 DIAGNOSIS — M791 Myalgia, unspecified site: Secondary | ICD-10-CM | POA: Diagnosis not present

## 2021-09-03 DIAGNOSIS — M2550 Pain in unspecified joint: Secondary | ICD-10-CM | POA: Diagnosis not present

## 2021-09-03 DIAGNOSIS — Z6822 Body mass index (BMI) 22.0-22.9, adult: Secondary | ICD-10-CM | POA: Diagnosis not present

## 2021-09-03 DIAGNOSIS — M720 Palmar fascial fibromatosis [Dupuytren]: Secondary | ICD-10-CM | POA: Diagnosis not present

## 2021-09-03 DIAGNOSIS — R634 Abnormal weight loss: Secondary | ICD-10-CM | POA: Diagnosis not present

## 2021-09-11 ENCOUNTER — Other Ambulatory Visit: Payer: Self-pay | Admitting: Oncology

## 2021-09-11 ENCOUNTER — Other Ambulatory Visit: Payer: Self-pay | Admitting: Student

## 2021-09-11 DIAGNOSIS — D473 Essential (hemorrhagic) thrombocythemia: Secondary | ICD-10-CM

## 2021-09-14 ENCOUNTER — Ambulatory Visit (HOSPITAL_COMMUNITY)
Admission: RE | Admit: 2021-09-14 | Discharge: 2021-09-14 | Disposition: A | Discharge status: Home / Self Care | Payer: Medicare Other | Source: Ambulatory Visit | Attending: Oncology | Admitting: Oncology

## 2021-09-14 ENCOUNTER — Other Ambulatory Visit: Payer: Self-pay

## 2021-09-14 ENCOUNTER — Encounter (HOSPITAL_COMMUNITY): Payer: Self-pay

## 2021-09-14 DIAGNOSIS — Z87891 Personal history of nicotine dependence: Secondary | ICD-10-CM | POA: Diagnosis not present

## 2021-09-14 DIAGNOSIS — D471 Chronic myeloproliferative disease: Secondary | ICD-10-CM | POA: Insufficient documentation

## 2021-09-14 DIAGNOSIS — D473 Essential (hemorrhagic) thrombocythemia: Secondary | ICD-10-CM | POA: Insufficient documentation

## 2021-09-14 DIAGNOSIS — D7589 Other specified diseases of blood and blood-forming organs: Secondary | ICD-10-CM | POA: Diagnosis not present

## 2021-09-14 DIAGNOSIS — M898X9 Other specified disorders of bone, unspecified site: Secondary | ICD-10-CM | POA: Insufficient documentation

## 2021-09-14 DIAGNOSIS — D75839 Thrombocytosis, unspecified: Secondary | ICD-10-CM | POA: Diagnosis not present

## 2021-09-14 DIAGNOSIS — R202 Paresthesia of skin: Secondary | ICD-10-CM | POA: Insufficient documentation

## 2021-09-14 LAB — CBC WITH DIFFERENTIAL/PLATELET
Abs Immature Granulocytes: 0.1 10*3/uL — ABNORMAL HIGH (ref 0.00–0.07)
Basophils Absolute: 0 10*3/uL (ref 0.0–0.1)
Basophils Relative: 0 %
Eosinophils Absolute: 0.1 10*3/uL (ref 0.0–0.5)
Eosinophils Relative: 1 %
HCT: 41.4 % (ref 36.0–46.0)
Hemoglobin: 14 g/dL (ref 12.0–15.0)
Immature Granulocytes: 1 %
Lymphocytes Relative: 17 %
Lymphs Abs: 1.8 10*3/uL (ref 0.7–4.0)
MCH: 38.3 pg — ABNORMAL HIGH (ref 26.0–34.0)
MCHC: 33.8 g/dL (ref 30.0–36.0)
MCV: 113.1 fL — ABNORMAL HIGH (ref 80.0–100.0)
Monocytes Absolute: 0.5 10*3/uL (ref 0.1–1.0)
Monocytes Relative: 5 %
Neutro Abs: 7.9 10*3/uL — ABNORMAL HIGH (ref 1.7–7.7)
Neutrophils Relative %: 76 %
Platelets: 527 10*3/uL — ABNORMAL HIGH (ref 150–400)
RBC: 3.66 MIL/uL — ABNORMAL LOW (ref 3.87–5.11)
RDW: 17.4 % — ABNORMAL HIGH (ref 11.5–15.5)
WBC: 10.3 10*3/uL (ref 4.0–10.5)
nRBC: 0 % (ref 0.0–0.2)

## 2021-09-14 MED ORDER — MIDAZOLAM HCL 2 MG/2ML IJ SOLN
INTRAMUSCULAR | Status: AC
  Filled 2021-09-14: qty 4

## 2021-09-14 MED ORDER — MIDAZOLAM HCL 2 MG/2ML IJ SOLN
INTRAMUSCULAR | Status: AC | PRN
  Administered 2021-09-14 (×2): 1 mg via INTRAVENOUS

## 2021-09-14 MED ORDER — SODIUM CHLORIDE 0.9 % IV SOLN: INTRAVENOUS | Status: DC

## 2021-09-14 MED ORDER — FENTANYL CITRATE (PF) 100 MCG/2ML IJ SOLN
INTRAMUSCULAR | Status: AC
  Filled 2021-09-14: qty 2

## 2021-09-14 MED ORDER — FENTANYL CITRATE (PF) 100 MCG/2ML IJ SOLN
INTRAMUSCULAR | Status: AC | PRN
  Administered 2021-09-14 (×2): 50 ug via INTRAVENOUS

## 2021-09-14 MED ORDER — LIDOCAINE HCL (PF) 1 % IJ SOLN
INTRAMUSCULAR | Status: AC | PRN
  Administered 2021-09-14: 20 mL

## 2021-09-14 NOTE — Procedures (Signed)
Interventional Radiology Procedure:   Indications: JAK2 positive essential thrombocythemia  Procedure: CT guided bone marrow biopsy  Findings: 3 aspirates and 2 cores from right ilium  Complications: None     EBL: Minimal, less than 10 ml  Plan: Discharge to home in one hour.   Zariah Cavendish R. Anselm Pancoast, MD  Pager: [phone]

## 2021-09-14 NOTE — Consult Note (Addendum)
Chief Complaint: Patient was seen in consultation today for CT-guided bone marrow biopsy  Referring Physician(s): Wyatt Portela  Supervising Physician: Markus Daft  Patient Status: [facility] - Out-pt  History of Present Illness: Anna Fuller is a 75 y.o. female, former smoker, with history of essential thrombocythemia and JAK2 positive myeloproliferative disorder since 2007.  She now presents with diffuse arthralgias and myalgias which has been debilitating for her, ? PMR.  She is currently on steroid therapy.  She presents today for CT-guided bone marrow biopsy to ensure no other hematological etiology of her symptoms.  Past Medical History:  Diagnosis Date   Cancer [facility])    essential thrombocythemia   Hyperlipidemia    Macular degeneration    Neuromuscular disorder ([place])    chemo induced neuropathy   Thrombocytosis     Past Surgical History:  Procedure Laterality Date   ABDOMINAL HYSTERECTOMY     CHOLECYSTECTOMY     COLONOSCOPY     extra bones removed     on feet, knees and wrist   POLYPECTOMY     TONSILLECTOMY     TUMOR EXCISION     left leg    Allergies: Codeine, Iodine, and Penicillins  Medications: Prior to Admission medications   Medication Sig Start Date End Date Taking? Authorizing Provider  aspirin 81 MG tablet Take 81 mg by mouth daily.   Yes [provider]  cholecalciferol (VITAMIN D) 1000 UNITS tablet Take 2,000 Units by mouth daily.   Yes [provider]  HYDROmorphone (DILAUDID) 2 MG tablet One tab by mouth twice daily with a meal 08/22/21  Yes Baxley, Cresenciano Lick, MD  hydroxyurea (HYDREA) 500 MG capsule TAKE 3 CAPSULES EVERY DAY 11/29/20  Yes Wyatt Portela, MD  Multiple Vitamins-Minerals (ICAPS AREDS 2 PO) Take by mouth.   Yes [provider]  ondansetron (ZOFRAN) 4 MG tablet Take 1 tablet (4 mg total) by mouth every 8 (eight) hours as needed for nausea or vomiting. 01/29/20  Yes Wyatt Portela, MD  predniSONE (DELTASONE) 20  MG tablet Take 2 tablets (40 mg total) by mouth daily with breakfast. 08/31/21  Yes Wyatt Portela, MD  simvastatin (ZOCOR) 40 MG tablet TAKE 1 TABLET BY MOUTH EVERYDAY AT BEDTIME 08/28/21   Elby Showers, MD     Family History  Problem Relation Age of Onset   Pulmonary fibrosis Mother    Colon polyps Mother    Diabetes Father    Esophageal cancer Maternal Uncle    Breast cancer Sister    Colon cancer Neg Hx    Stomach cancer Neg Hx    Rectal cancer Neg Hx     Social History   Socioeconomic History   Marital status: Single    Spouse name: Not on file   Number of children: Not on file   Years of education: Not on file   Highest education level: Not on file  Occupational History   Not on file  Tobacco Use   Smoking status: Former    Types: Cigarettes    Quit date: 08/18/2002    Years since quitting: 19.0   Smokeless tobacco: Never  Vaping Use   Vaping Use: Never used  Substance and Sexual Activity   Alcohol use: Yes    Alcohol/week: 0.0 standard drinks    Comment: rare   Drug use: No   Sexual activity: Never  Other Topics Concern   Not on file  Social History Narrative   Right handed  Lives alone   Social Determinants of Health   Financial Resource Strain: Not on file  Food Insecurity: Not on file  Transportation Needs: Not on file  Physical Activity: Not on file  Stress: Not on file  Social Connections: Not on file      Review of Systems denies fever, headache, chest pain, dyspnea, cough, worsening abdominal pain, nausea, vomiting or bleeding  Vital Signs: BP 140/65    Pulse 78    Temp 98.2 F (36.8 C) (Oral)    Resp 16    SpO2 98%   Physical Exam awake, alert.  Chest with clear but distant breath sounds bilaterally.  Heart with regular rate and rhythm.  Abdomen soft, positive bowel sounds, nontender.  No lower extremity edema.  Imaging: NM PET Image Initial (PI) Skull Base To Thigh  Result Date: 08/24/2021 CLINICAL DATA:  Subsequent treatment  strategy for myeloproliferative disorder with a central thrombocytopenia. Myalgias and body aches. EXAM: NUCLEAR MEDICINE PET SKULL BASE TO THIGH TECHNIQUE: 6.1 mCi F-18 FDG was injected intravenously. Full-ring PET imaging was performed from the skull base to thigh after the radiotracer. CT data was obtained and used for attenuation correction and anatomic localization. Fasting blood glucose: 93 mg/dl COMPARISON:  None. FINDINGS: Mediastinal blood pool activity: SUV max 3.3 Liver activity: SUV max NA NECK: No hypermetabolic lymph nodes in the neck. Incidental CT findings: none CHEST: No hypermetabolic mediastinal or hilar nodes. No suspicious pulmonary nodules on the CT scan. Incidental CT findings: Mild atherosclerotic calcification noted in the abdominal aorta. Centrilobular emphsyema noted. 4 mm left upper lobe nodule on 57/4 is below size threshold for resolution on PET imaging. Similar 3 mm perifissural left upper lobe nodule identified on 65/4. ABDOMEN/PELVIS: No abnormal hypermetabolic activity within the liver, pancreas, adrenal glands, or spleen. No hypermetabolic lymph nodes in the abdomen or pelvis. Incidental CT findings: Gallbladder surgically absent. Mild intra and extrahepatic biliary duct distension likely related to prior surgery. Mild diverticular disease noted left colon. SKELETON: Mottled uptake is identified in the thoracolumbar spine and ribs. Symmetric prominent uptake noted in both shoulders and both hips, potentially related to inflammation although no substantial degenerative changes in these major joints on CT imaging. Uptake along the spinous processes noted lower lumbar spine without underlying bone lesion on CT imaging. Incidental CT findings: none IMPRESSION: 1. Mottled uptake identified in the ribs and thoracolumbar spine, nonspecific. No underlying bony abnormality on CT imaging. There is also relatively diffuse uptake in the region of the shoulders and hips bilaterally, potentially  related to underlying joint inflammation. 2. Cholecystectomy with mild biliary dilatation, likely postsurgical. Correlation with liver function tests may prove helpful. 3.  Emphysema. (ICD10-J43.9) 4.  Aortic Atherosclerois (ICD10-170.0) Electronically Signed   By: Misty Stanley M.D.   On: 08/24/2021 09:03    Labs:  CBC: Recent Labs    04/02/21 0749 05/28/21 0741 07/30/21 0747 09/14/21 0810  WBC 8.3 8.2 11.9* 10.3  HGB 15.0 14.7 13.1 14.0  HCT 43.1 43.0 38.8 41.4  PLT 718* 653* 850* 527*    COAGS: No results for input(s): INR, APTT in the last 8760 hours.  BMP: Recent Labs    11/18/20 0911 12/03/20 0736 01/29/21 0737 04/02/21 0749 05/28/21 0741 07/30/21 0747  NA 139   < > 140 139 141 139  K 5.5*   < > 4.1 4.0 4.1 3.7  CL 102   < > 107 105 108 104  CO2 27   < > 24 24 24  24  GLUCOSE 79   < > 93 91 91 96  BUN 21   < > 14 13 11 20   CALCIUM 9.9   < > 9.2 8.8* 9.1 9.0  CREATININE 1.15*   < > 1.04* 0.96 1.04* 0.96  GFRNONAA 46*   < > 56* >60 56* >60  GFRAA 54*  --   --   --   --   --    < > = values in this interval not displayed.    LIVER FUNCTION TESTS: Recent Labs    01/29/21 0737 04/02/21 0749 05/28/21 0741 07/30/21 0747  BILITOT 0.5 0.5 0.5 0.4  AST 23 23 20 15   ALT 14 17 16 7   ALKPHOS 82 86 69 108  PROT 7.1 6.7 6.6 6.9  ALBUMIN 4.3 3.9 4.0 3.4*    TUMOR MARKERS: No results for input(s): AFPTM, CEA, CA199, CHROMGRNA in the last 8760 hours.  Assessment and Plan: 75 y.o. female, former smoker, with history of essential thrombocythemia and JAK2 positive myeloproliferative disorder since 2007.  She now presents with diffuse arthralgias and myalgias which has been debilitating for her,? PMR.  She is currently on steroid therapy.  She presents today for CT-guided bone marrow biopsy to ensure no other hematological etiology of her symptoms.Risks and benefits of procedure was discussed with the patient including, but not limited to bleeding, infection, damage to  adjacent structures or low yield requiring additional tests.  All of the questions were answered and there is agreement to proceed.  Consent signed and in chart.    Thank you for this interesting consult.  I greatly enjoyed meeting Anna Fuller and look forward to participating in their care.  A copy of this report was sent to the requesting provider on this date.  Electronically Signed: D. Rowe Robert, PA-C 09/14/2021, 8:29 AM   I spent a total of  20 minutes   in face to face in clinical consultation, greater than 50% of which was counseling/coordinating care for CT-guided bone marrow biopsy

## 2021-09-16 LAB — SURGICAL PATHOLOGY

## 2021-09-23 ENCOUNTER — Encounter (HOSPITAL_COMMUNITY): Payer: Self-pay

## 2021-09-23 ENCOUNTER — Encounter (HOSPITAL_COMMUNITY): Payer: Self-pay | Admitting: Oncology

## 2021-10-01 ENCOUNTER — Inpatient Hospital Stay: Payer: Medicare Other | Attending: Oncology

## 2021-10-01 ENCOUNTER — Inpatient Hospital Stay (HOSPITAL_BASED_OUTPATIENT_CLINIC_OR_DEPARTMENT_OTHER): Payer: Medicare Other | Admitting: Oncology

## 2021-10-01 ENCOUNTER — Other Ambulatory Visit: Payer: Self-pay

## 2021-10-01 VITALS — BP 129/58 | HR 74 | Temp 97.9°F | Resp 18 | Ht 62.0 in | Wt 131.1 lb

## 2021-10-01 DIAGNOSIS — M353 Polymyalgia rheumatica: Secondary | ICD-10-CM | POA: Insufficient documentation

## 2021-10-01 DIAGNOSIS — Z885 Allergy status to narcotic agent status: Secondary | ICD-10-CM | POA: Diagnosis not present

## 2021-10-01 DIAGNOSIS — Z79899 Other long term (current) drug therapy: Secondary | ICD-10-CM | POA: Diagnosis not present

## 2021-10-01 DIAGNOSIS — Z888 Allergy status to other drugs, medicaments and biological substances status: Secondary | ICD-10-CM | POA: Diagnosis not present

## 2021-10-01 DIAGNOSIS — D75839 Thrombocytosis, unspecified: Secondary | ICD-10-CM | POA: Diagnosis not present

## 2021-10-01 DIAGNOSIS — D7589 Other specified diseases of blood and blood-forming organs: Secondary | ICD-10-CM | POA: Insufficient documentation

## 2021-10-01 DIAGNOSIS — Z880 Allergy status to penicillin: Secondary | ICD-10-CM | POA: Insufficient documentation

## 2021-10-01 DIAGNOSIS — D471 Chronic myeloproliferative disease: Secondary | ICD-10-CM | POA: Insufficient documentation

## 2021-10-01 DIAGNOSIS — Z7964 Long term (current) use of myelosuppressive agent: Secondary | ICD-10-CM | POA: Diagnosis not present

## 2021-10-01 DIAGNOSIS — E538 Deficiency of other specified B group vitamins: Secondary | ICD-10-CM

## 2021-10-01 DIAGNOSIS — D473 Essential (hemorrhagic) thrombocythemia: Secondary | ICD-10-CM

## 2021-10-01 LAB — CBC WITH DIFFERENTIAL (CANCER CENTER ONLY)
Abs Immature Granulocytes: 0.28 10*3/uL — ABNORMAL HIGH (ref 0.00–0.07)
Basophils Absolute: 0.1 10*3/uL (ref 0.0–0.1)
Basophils Relative: 1 %
Eosinophils Absolute: 0.1 10*3/uL (ref 0.0–0.5)
Eosinophils Relative: 1 %
HCT: 42.3 % (ref 36.0–46.0)
Hemoglobin: 14.2 g/dL (ref 12.0–15.0)
Immature Granulocytes: 3 %
Lymphocytes Relative: 32 %
Lymphs Abs: 3.1 10*3/uL (ref 0.7–4.0)
MCH: 38.5 pg — ABNORMAL HIGH (ref 26.0–34.0)
MCHC: 33.6 g/dL (ref 30.0–36.0)
MCV: 114.6 fL — ABNORMAL HIGH (ref 80.0–100.0)
Monocytes Absolute: 0.6 10*3/uL (ref 0.1–1.0)
Monocytes Relative: 6 %
Neutro Abs: 5.6 10*3/uL (ref 1.7–7.7)
Neutrophils Relative %: 57 %
Platelet Count: 881 10*3/uL — ABNORMAL HIGH (ref 150–400)
RBC: 3.69 MIL/uL — ABNORMAL LOW (ref 3.87–5.11)
RDW: 18.3 % — ABNORMAL HIGH (ref 11.5–15.5)
WBC Count: 9.7 10*3/uL (ref 4.0–10.5)
nRBC: 0 % (ref 0.0–0.2)

## 2021-10-01 LAB — CMP (CANCER CENTER ONLY)
ALT: 17 U/L (ref 0–44)
AST: 14 U/L — ABNORMAL LOW (ref 15–41)
Albumin: 3.9 g/dL (ref 3.5–5.0)
Alkaline Phosphatase: 55 U/L (ref 38–126)
Anion gap: 6 (ref 5–15)
BUN: 19 mg/dL (ref 8–23)
CO2: 30 mmol/L (ref 22–32)
Calcium: 8.7 mg/dL — ABNORMAL LOW (ref 8.9–10.3)
Chloride: 104 mmol/L (ref 98–111)
Creatinine: 1.05 mg/dL — ABNORMAL HIGH (ref 0.44–1.00)
GFR, Estimated: 55 mL/min — ABNORMAL LOW (ref >60)
Glucose, Bld: 80 mg/dL (ref 70–99)
Potassium: 3.7 mmol/L (ref 3.5–5.1)
Sodium: 140 mmol/L (ref 135–145)
Total Bilirubin: 0.5 mg/dL (ref 0.3–1.2)
Total Protein: 6.1 g/dL — ABNORMAL LOW (ref 6.5–8.1)

## 2021-10-01 LAB — VITAMIN B12: Vitamin B-12: 513 pg/mL (ref 180–914)

## 2021-10-01 NOTE — Progress Notes (Signed)
Hematology and Oncology Follow Up Visit  Anna Fuller [id number] 01/15/1946 76 y.o. 10/01/2021 7:27 AM      Principle Diagnosis: 76 year old woman with essential thrombocythemia diagnosed in 2007.  She was found to have JAK2 positive myeloproliferative disorder.  Prior Therapy:   She is status post Anagrelide therapy discontinued due to do intolerance 2008.   Current therapy:   Hydroxyurea started in 2009.  She is currently on 1500 mg daily starting on 08/06/2016.  Prednisone tapering doses.  She is currently on 10 mg daily  Interim History: Anna Fuller returns today for repeat evaluation.  Since the last visit, she reports feeling better on prednisone.  Her arthralgias and myalgias continues to improve and nearly resolved although she does require occasional tramadol and Tylenol.  She denies any thrombosis or bleeding episodes.  She denies any complications related to her bone marrow biopsy.  Her performance status quality of life has returned to baseline.        Medications: Reviewed without changes Current Outpatient Prescriptions  Medication Sig Dispense Refill   aspirin 81 MG tablet Take 81 mg by mouth daily.         cholecalciferol (VITAMIN D) 1000 UNITS tablet Take 2,000 Units by mouth daily.       estrogens, conjugated, (PREMARIN) 0.3 MG tablet Take 1 tablet (0.3 mg total) by mouth daily. Take daily for 21 days then do not take for 7 days.  30 tablet  11   hydroxyurea (HYDREA) 500 MG capsule Take one tab twice a day.  Every other night take an additional tablet at night.  (Alternate 109m and 15063m.  120 capsule  3   simvastatin (ZOCOR) 40 MG tablet Take 1 tablet (40 mg total) by mouth at bedtime.  30 tablet  11         Allergies: Reviewed with the patient again today. Allergies  Allergen Reactions   Codeine Nausea And Vomiting   Iodine Anaphylaxis    Contrast dye   Penicillins Other (See Comments)     Physical Exam:         Blood pressure  (!) 129/58, pulse 74, temperature 97.9 F (36.6 C), temperature source Temporal, resp. rate 18, height 5' 2" (1.575 m), weight 131 lb 1.6 oz (59.5 kg), SpO2 99 %.      ECOG: 1    General appearance: Alert, awake without any distress. Head: Atraumatic without abnormalities Oropharynx: Without any thrush or ulcers. Eyes: No scleral icterus. Lymph nodes: No lymphadenopathy noted in the cervical, supraclavicular, or axillary nodes Heart:regular rate and rhythm, without any murmurs or gallops.   Lung: Clear to auscultation without any rhonchi, wheezes or dullness to percussion. Abdomin: Soft, nontender without any shifting dullness or ascites. Musculoskeletal: No clubbing or cyanosis. Neurological: No motor or sensory deficits. Skin: No rashes or lesions.                     Lab Results: Lab Results  Component Value Date   WBC 10.3 09/14/2021   HGB 14.0 09/14/2021   HCT 41.4 09/14/2021   MCV 113.1 (H) 09/14/2021   PLT 527 (H) 09/14/2021     Chemistry      Component Value Date/Time   NA 139 07/30/2021 0747   NA 140 09/28/2017 0811   K 3.7 07/30/2021 0747   K 3.8 09/28/2017 0811   CL 104 07/30/2021 0747   CL 105 01/30/2013 0825   CO2 24 07/30/2021 0747   CO2 25 09/28/2017 086503  BUN 20 07/30/2021 0747   BUN 14.6 09/28/2017 0811   CREATININE 0.96 07/30/2021 0747   CREATININE 1.15 (H) 11/18/2020 0911   CREATININE 0.9 09/28/2017 0811      Component Value Date/Time   CALCIUM 9.0 07/30/2021 0747   CALCIUM 9.1 09/28/2017 0811   ALKPHOS 108 07/30/2021 0747   ALKPHOS 75 09/28/2017 0811   AST 15 07/30/2021 0747   AST 23 09/28/2017 0811   ALT 7 07/30/2021 0747   ALT 23 09/28/2017 0811   BILITOT 0.4 07/30/2021 0747   BILITOT 0.46 09/28/2017 0811        Surgical Pathology  CASE: [id number]  PATIENT: Anna Fuller  Bone Marrow Report      Clinical History: Essential thrombocytosis . Myeloproliferative disorder  ([facility])    DIAGNOSIS:    BONE MARROW, ASPIRATE, CLOT, CORE:  -  Hypercellular marrow involved by JAK2-positive myeloproliferative  neoplasm  -  See comment and microscopic description below    PERIPHERAL BLOOD:  -  Macrocytosis and thrombocytosis  -  See complete blood cell count   COMMENT:   The morphologic findings are compatible with the patient's history of  essential thrombocythemia.  There are no histologic features to suggest  disease progression.  Correlation with cytogenetics is recommended.      Impression and Plan:  76 year old woman with:  1.  Essential thrombocythemia diagnosed in 2007.  She was found to have JAK2 positive mutation.  Her disease status was updated at this time.  Bone marrow biopsy obtained on 09/14/2021 were reviewed and showed consistent finding of myeloproliferative disorder without any further progression.  There is no evidence of infiltrative myelofibrosis or transformation into leukemia.  Her platelet count on that day was 527.  Laboratory data from today showed a platelet count of 881.  At this time I recommended continuing the same dose and schedule of hydroxyurea.  Risks and benefits of continuing hydroxyurea versus alternative treatment choices were reviewed.  She is agreeable to continue.  2.  Polymyalgia rheumatica: She has been evaluated by rheumatology and currently on prednisone.  I have recommended a slow prednisone taper.  She has follow-up next week.      3.  Thrombosis prophylaxis: No recent thrombosis or bleeding noted.  Thrombosis risk is low.    4.  Follow-up: In 2 months for repeat follow-up.  30   minutes were spent on this encounter.  The time was dedicated to reviewing laboratory data, disease status update, treatment options and future plan of care review.  Zola Button, MD 1/5/20237:27 AM`

## 2021-10-06 DIAGNOSIS — M2550 Pain in unspecified joint: Secondary | ICD-10-CM | POA: Diagnosis not present

## 2021-10-06 DIAGNOSIS — D471 Chronic myeloproliferative disease: Secondary | ICD-10-CM | POA: Diagnosis not present

## 2021-10-06 DIAGNOSIS — Z6824 Body mass index (BMI) 24.0-24.9, adult: Secondary | ICD-10-CM | POA: Diagnosis not present

## 2021-10-06 DIAGNOSIS — M791 Myalgia, unspecified site: Secondary | ICD-10-CM | POA: Diagnosis not present

## 2021-10-06 DIAGNOSIS — R5383 Other fatigue: Secondary | ICD-10-CM | POA: Diagnosis not present

## 2021-10-07 ENCOUNTER — Encounter: Payer: Self-pay | Admitting: Internal Medicine

## 2021-10-07 LAB — SURGICAL PATHOLOGY

## 2021-11-20 ENCOUNTER — Other Ambulatory Visit: Payer: Self-pay

## 2021-11-20 ENCOUNTER — Other Ambulatory Visit: Payer: Medicare Other | Admitting: Internal Medicine

## 2021-11-20 DIAGNOSIS — R5383 Other fatigue: Secondary | ICD-10-CM | POA: Diagnosis not present

## 2021-11-20 DIAGNOSIS — E785 Hyperlipidemia, unspecified: Secondary | ICD-10-CM

## 2021-11-20 LAB — LIPID PANEL
Cholesterol: 194 mg/dL (ref <200)
HDL: 49 mg/dL — ABNORMAL LOW (ref >=50)
LDL Cholesterol (Calc): 120 mg/dL (calc) — ABNORMAL HIGH
Non-HDL Cholesterol (Calc): 145 mg/dL (calc) — ABNORMAL HIGH (ref <130)
Total CHOL/HDL Ratio: 4 (calc) (ref <5.0)
Triglycerides: 136 mg/dL (ref <150)

## 2021-11-20 LAB — TSH: TSH: 3.06 mIU/L (ref 0.40–4.50)

## 2021-11-23 ENCOUNTER — Other Ambulatory Visit: Payer: Self-pay

## 2021-11-23 ENCOUNTER — Encounter: Payer: Self-pay | Admitting: Internal Medicine

## 2021-11-23 ENCOUNTER — Ambulatory Visit (INDEPENDENT_AMBULATORY_CARE_PROVIDER_SITE_OTHER): Payer: Medicare Other | Admitting: Internal Medicine

## 2021-11-23 ENCOUNTER — Other Ambulatory Visit: Payer: Self-pay | Admitting: Internal Medicine

## 2021-11-23 ENCOUNTER — Telehealth: Payer: Self-pay | Admitting: Internal Medicine

## 2021-11-23 VITALS — BP 130/70 | HR 73 | Temp 97.0°F | Ht 62.0 in | Wt 133.5 lb

## 2021-11-23 DIAGNOSIS — Z90722 Acquired absence of ovaries, bilateral: Secondary | ICD-10-CM

## 2021-11-23 DIAGNOSIS — Z0000 Encounter for general adult medical examination without abnormal findings: Secondary | ICD-10-CM | POA: Diagnosis not present

## 2021-11-23 DIAGNOSIS — Z90710 Acquired absence of both cervix and uterus: Secondary | ICD-10-CM

## 2021-11-23 DIAGNOSIS — D473 Essential (hemorrhagic) thrombocythemia: Secondary | ICD-10-CM

## 2021-11-23 DIAGNOSIS — M898X9 Other specified disorders of bone, unspecified site: Secondary | ICD-10-CM | POA: Diagnosis not present

## 2021-11-23 DIAGNOSIS — Z9079 Acquired absence of other genital organ(s): Secondary | ICD-10-CM | POA: Diagnosis not present

## 2021-11-23 DIAGNOSIS — R030 Elevated blood-pressure reading, without diagnosis of hypertension: Secondary | ICD-10-CM

## 2021-11-23 DIAGNOSIS — E7800 Pure hypercholesterolemia, unspecified: Secondary | ICD-10-CM

## 2021-11-23 LAB — POCT URINALYSIS DIPSTICK
Bilirubin, UA: NEGATIVE
Blood, UA: NEGATIVE
Glucose, UA: NEGATIVE
Ketones, UA: NEGATIVE
Leukocytes, UA: NEGATIVE
Nitrite, UA: NEGATIVE
Protein, UA: NEGATIVE
Spec Grav, UA: 1.015 (ref 1.010–1.025)
Urobilinogen, UA: 0.2 E.U./dL
pH, UA: 7.5 (ref 5.0–8.0)

## 2021-11-23 MED ORDER — PREDNISONE 1 MG PO TBEC: 1.0000 mg | DELAYED_RELEASE_TABLET | ORAL | 1 refills | Status: DC

## 2021-11-23 MED ORDER — PREDNISONE 1 MG PO TABS: 1.0000 mg | ORAL_TABLET | ORAL | 2 refills | Status: DC

## 2021-11-23 NOTE — Telephone Encounter (Signed)
Dr baxley took care of this.

## 2021-11-23 NOTE — Progress Notes (Signed)
Subjective:    Patient ID: Anna Fuller, female    DOB: 1945/12/30, 76 y.o.   MRN: [id number]  HPI 76 year old Female for health maintenance exam and evaluation of medical issues.  History of essential thrombocytosis JAK2 myeloproliferative disorder diagnosed in 2007 and followed by Dr. Alen Blew.  History of hyperlipidemia treated with simvastatin.  Long history of estrogen replacement.  Has not wanted to be taken off of estrogen replacement therapy.  She is allergic to contrast dye, cortisone and penicillin.  In November she developed significant musculoskeletal pain.  Her sed rate was 65.  Other rheumatology labs were unremarkable.  Was given Depo-Medrol 80 mg IM in the office and tramadol to take for pain in November.  She continued not to improve.  Dr. Alen Blew ordered CT bone marrow biopsy and aspiration as well as a PET scan.  Bone marrow biopsy showed hypercellular marrow involved by JAK2 positive myeloproliferative neoplasm.  No monoclonal B-cell or phenotypically aberrant T-cell population identified.  She was seen by Dr. Trudie Reed, rheumatologist.  No rheumatology cause for the musculoskeletal pain was discovered.  At 1 point in November she had to take Dilaudid to control the pain.  Ultram did not help.  Prednisone did seem to help.  Had a Medrol Dosepak in early December and then was placed on 40 mg of Deltasone daily.  Still has musculoskeletal pain.  I have suggested that she take a lower dose of prednisone on a daily basis.  I think 5 mg would be reasonable but could only get her to agree to a lower dose so we are going to try 1 mg every other day.  She is uncomfortable taking it on a chronic basis but it did help her pain.  Social history: She is single and retired.  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  Completed 2 years of college.  Does not smoke or consume alcohol.  Family history: Her sister, Anna Fuller is  also a patient here and lives in [place].  No other siblings.  Father died at age 33 with complications of diabetes.  Mother died at age 73 of pulmonary fibrosis.  Tetanus immunization was given in 2006 but she had significant local reaction so it will not be repeated.  She has an elevated LDL of 120.  She is on simvastatin 40 mg daily.  Has not really been able to exercise due to musculoskeletal pain.  History of TAH/BSO in 1991.  Cholecystectomy 1993.  Hysterectomy with bilateral oophorectomy 1991.  Right wrist surgery 2008.  Foot surgery of both feet 1965, 1968, 1969.  Has had left and right knee surgeries.  Hospitalized for FUO 8.     Review of Systems see above-Main issue is musculoskeletal pain     Objective:   Physical Exam Blood pressure is 160/80 but she is anxious.  Weight 133 pounds 8 ounces BMI 24.42  Skin: Warm and dry.  No cervical adenopathy appreciated.  No carotid bruits or thyromegaly.  Chest is clear to auscultation.  Cardiac exam: Regular rate and rhythm without murmur or ectopy.  Breasts are without masses.  No hepatosplenomegaly masses or tenderness.  No lower extremity pitting edema.  No erythematous or tender joints to my exam.  No increased warmth of joints.  Neuro is intact without gross focal deficits.  Affect is slightly anxious.       Assessment & Plan:  Diffuse myalgias- Has had extensive Rheumatology work up.  Not sure etiology but prednisone helps. She is reluctant to take it. We have agreed she will take 1 mg every other day but I would prefer 2 mg daily.  History of JAK2 myeloproliferative disorder diagnosed in 2007 followed by Dr. Alen Blew  Hyperlipidemia treated with statin medication  History of estrogen replacement-does not want to stop this  Colonoscopy is up-to-date having been done in 2019  Mammogram ordered.  Last had mammogram in 2021 by our records.  Postmenopausal female with at risk for osteoporosis-bone density study  ordered  Plan: Patient agrees to take prednisone 1 mg daily.  Would prefer she take a slightly higher dose but she declines.  Return in 1 year or as needed.  Recommend bone density study and mammogram.  Continue close follow-up with Dr. Alen Blew.

## 2021-11-23 NOTE — Telephone Encounter (Signed)
Teckla Christiansen [phone]  Vonnie called to say that the prednisone prescription that was sent in was time releases and her insurance will not pay.

## 2021-11-23 NOTE — Progress Notes (Deleted)
° °  Subjective:    Patient ID: Anna Fuller, female    DOB: [birth year], 76 y.o.   MRN: [id number]  HPI    Review of Systems     Objective:   Physical Exam        Assessment & Plan:

## 2021-11-24 NOTE — Patient Instructions (Signed)
It was a pleasure to see you today.  I do think you should be on chronic prednisone therapy.  I would prefer it be between 2 and 5 mg daily but we have discussed this and you are willing to take 1 mg daily.

## 2021-12-03 ENCOUNTER — Encounter: Payer: Self-pay | Admitting: *Deleted

## 2021-12-03 ENCOUNTER — Inpatient Hospital Stay (HOSPITAL_BASED_OUTPATIENT_CLINIC_OR_DEPARTMENT_OTHER): Payer: Medicare Other | Admitting: Oncology

## 2021-12-03 ENCOUNTER — Encounter: Payer: Self-pay | Admitting: Oncology

## 2021-12-03 ENCOUNTER — Inpatient Hospital Stay: Payer: Medicare Other | Attending: Oncology

## 2021-12-03 ENCOUNTER — Other Ambulatory Visit: Payer: Self-pay

## 2021-12-03 VITALS — BP 142/75 | HR 81 | Temp 97.3°F | Resp 16 | Wt 131.5 lb

## 2021-12-03 DIAGNOSIS — M791 Myalgia, unspecified site: Secondary | ICD-10-CM | POA: Insufficient documentation

## 2021-12-03 DIAGNOSIS — Z885 Allergy status to narcotic agent status: Secondary | ICD-10-CM | POA: Insufficient documentation

## 2021-12-03 DIAGNOSIS — M353 Polymyalgia rheumatica: Secondary | ICD-10-CM | POA: Insufficient documentation

## 2021-12-03 DIAGNOSIS — D471 Chronic myeloproliferative disease: Secondary | ICD-10-CM | POA: Diagnosis not present

## 2021-12-03 DIAGNOSIS — D75839 Thrombocytosis, unspecified: Secondary | ICD-10-CM | POA: Diagnosis not present

## 2021-12-03 DIAGNOSIS — D473 Essential (hemorrhagic) thrombocythemia: Secondary | ICD-10-CM | POA: Diagnosis not present

## 2021-12-03 DIAGNOSIS — Z888 Allergy status to other drugs, medicaments and biological substances status: Secondary | ICD-10-CM | POA: Insufficient documentation

## 2021-12-03 DIAGNOSIS — Z880 Allergy status to penicillin: Secondary | ICD-10-CM | POA: Diagnosis not present

## 2021-12-03 DIAGNOSIS — Z79899 Other long term (current) drug therapy: Secondary | ICD-10-CM | POA: Diagnosis not present

## 2021-12-03 DIAGNOSIS — M25551 Pain in right hip: Secondary | ICD-10-CM | POA: Diagnosis not present

## 2021-12-03 DIAGNOSIS — E538 Deficiency of other specified B group vitamins: Secondary | ICD-10-CM

## 2021-12-03 DIAGNOSIS — M25552 Pain in left hip: Secondary | ICD-10-CM | POA: Diagnosis not present

## 2021-12-03 LAB — CMP (CANCER CENTER ONLY)
ALT: 8 U/L (ref 0–44)
AST: 14 U/L — ABNORMAL LOW (ref 15–41)
Albumin: 4.2 g/dL (ref 3.5–5.0)
Alkaline Phosphatase: 96 U/L (ref 38–126)
Anion gap: 8 (ref 5–15)
BUN: 18 mg/dL (ref 8–23)
CO2: 26 mmol/L (ref 22–32)
Calcium: 9.8 mg/dL (ref 8.9–10.3)
Chloride: 103 mmol/L (ref 98–111)
Creatinine: 0.98 mg/dL (ref 0.44–1.00)
GFR, Estimated: 59 mL/min — ABNORMAL LOW (ref >60)
Glucose, Bld: 99 mg/dL (ref 70–99)
Potassium: 4.2 mmol/L (ref 3.5–5.1)
Sodium: 137 mmol/L (ref 135–145)
Total Bilirubin: 0.5 mg/dL (ref 0.3–1.2)
Total Protein: 6.8 g/dL (ref 6.5–8.1)

## 2021-12-03 LAB — CBC WITH DIFFERENTIAL (CANCER CENTER ONLY)
Abs Immature Granulocytes: 0.07 10*3/uL (ref 0.00–0.07)
Basophils Absolute: 0.2 10*3/uL — ABNORMAL HIGH (ref 0.0–0.1)
Basophils Relative: 1 %
Eosinophils Absolute: 0.2 10*3/uL (ref 0.0–0.5)
Eosinophils Relative: 2 %
HCT: 44.4 % (ref 36.0–46.0)
Hemoglobin: 14.7 g/dL (ref 12.0–15.0)
Immature Granulocytes: 1 %
Lymphocytes Relative: 14 %
Lymphs Abs: 1.6 10*3/uL (ref 0.7–4.0)
MCH: 36.2 pg — ABNORMAL HIGH (ref 26.0–34.0)
MCHC: 33.1 g/dL (ref 30.0–36.0)
MCV: 109.4 fL — ABNORMAL HIGH (ref 80.0–100.0)
Monocytes Absolute: 1.1 10*3/uL — ABNORMAL HIGH (ref 0.1–1.0)
Monocytes Relative: 9 %
Neutro Abs: 8.8 10*3/uL — ABNORMAL HIGH (ref 1.7–7.7)
Neutrophils Relative %: 73 %
Platelet Count: 722 10*3/uL — ABNORMAL HIGH (ref 150–400)
RBC: 4.06 MIL/uL (ref 3.87–5.11)
RDW: 14.9 % (ref 11.5–15.5)
WBC Count: 12 10*3/uL — ABNORMAL HIGH (ref 4.0–10.5)
nRBC: 0 % (ref 0.0–0.2)

## 2021-12-03 LAB — VITAMIN B12: Vitamin B-12: 1195 pg/mL — ABNORMAL HIGH (ref 180–914)

## 2021-12-03 NOTE — Progress Notes (Signed)
Hematology and Oncology Follow Up Visit ? ?Shauna Hugh ?[id number] ?1946-03-08 76 y.o. ?12/03/2021 8:26 AM ? ?   ? ?Principle Diagnosis: 76 year old woman with myeloproliferative disorder diagnosed in 2007.  She was found to have JAK2 positive essential thrombocythemia. ? ?Secondary diagnosis: Polymyalgia rheumatica diagnosed in November 2022. ? ?Prior Therapy:  ? ?She is status post anagrelide therapy discontinued due to do intolerance 2008. ? ? ?Current therapy:  ? ?Hydroxyurea started in 2009.  She is currently on 1500 mg daily starting on 08/06/2016. ? ?Prednisone tapering doses.  She is currently on 1 mg daily ? ?Interim History: Ms. Weigel presents today for a follow-up visit.  Since last visit, she reports of feeling about the same without any major changes.  She does have minor arthralgias and myalgias predominantly in shoulders and hips related to PMR.  She is currently on 1 mg of prednisone daily after she has been on it every other day but felt slightly better with 1 mg daily.  She denies any nausea, vomiting or abdominal pain.  She still able to drive and attends to activities of daily living.  She denies any bleeding or thrombosis episodes.  He continues to tolerate hydroxyurea without any new complaints. ? ? ? ? ? ? ? ?Medications: Updated on review. ?Current Outpatient Prescriptions  ?Medication Sig Dispense Refill  ? aspirin 81 MG tablet Take 81 mg by mouth daily.        ? cholecalciferol (VITAMIN D) 1000 UNITS tablet Take 2,000 Units by mouth daily.      ? estrogens, conjugated, (PREMARIN) 0.3 MG tablet Take 1 tablet (0.3 mg total) by mouth daily. Take daily for 21 days then do not take for 7 days.  30 tablet  11  ? hydroxyurea (HYDREA) 500 MG capsule Take one tab twice a day.  Every other night take an additional tablet at night.  (Alternate '1000mg'$  and '1500mg'$ ).  120 capsule  3  ? simvastatin (ZOCOR) 40 MG tablet Take 1 tablet (40 mg total) by mouth at bedtime.  30 tablet  11  ? ?   ? ? ? ? ?Allergies: Reviewed with the patient again today. ?Allergies  ?Allergen Reactions  ? Codeine Nausea And Vomiting  ? Iodine Anaphylaxis  ?  Contrast dye  ? Penicillins Other (See Comments)  ? ? ? ?Physical Exam: ? ? ? ? ? ? ? ? ?Blood pressure (!) 142/75, pulse 81, temperature (!) 97.3 ?F (36.3 ?C), temperature source Temporal, resp. rate 16, weight 131 lb 8 oz (59.6 kg), SpO2 97 %. ? ? ? ? ? ?ECOG: 1 ? ? ?General appearance: Comfortable appearing without any discomfort ?Head: Normocephalic without any trauma ?Oropharynx: Mucous membranes are moist and pink without any thrush or ulcers. ?Eyes: Pupils are equal and round reactive to light. ?Lymph nodes: No cervical, supraclavicular, inguinal or axillary lymphadenopathy.   ?Heart:regular rate and rhythm.  S1 and S2 without leg edema. ?Lung: Clear without any rhonchi or wheezes.  No dullness to percussion. ?Abdomin: Soft, nontender, nondistended with good bowel sounds.  No hepatosplenomegaly. ?Musculoskeletal: No joint deformity or effusion.  Full range of motion noted. ?Neurological: No deficits noted on motor, sensory and deep tendon reflex exam. ?Skin: No petechial rash or dryness.  Appeared moist.  ? ? ? ? ? ? ? ? ? ? ? ? ? ? ? ? ? ? ? ? ? ?Lab Results: ?Lab Results  ?Component Value Date  ? WBC 12.0 (H) 12/03/2021  ? HGB 14.7 12/03/2021  ?  HCT 44.4 12/03/2021  ? MCV 109.4 (H) 12/03/2021  ? PLT 722 (H) 12/03/2021  ? ?  Chemistry   ?   ?Component Value Date/Time  ? NA 140 10/01/2021 0716  ? NA 140 09/28/2017 0811  ? K 3.7 10/01/2021 0716  ? K 3.8 09/28/2017 0811  ? CL 104 10/01/2021 0716  ? CL 105 01/30/2013 0825  ? CO2 30 10/01/2021 0716  ? CO2 25 09/28/2017 0811  ? BUN 19 10/01/2021 0716  ? BUN 14.6 09/28/2017 0811  ? CREATININE 1.05 (H) 10/01/2021 0716  ? CREATININE 1.15 (H) 11/18/2020 0911  ? CREATININE 0.9 09/28/2017 0811  ?    ?Component Value Date/Time  ? CALCIUM 8.7 (L) 10/01/2021 0716  ? CALCIUM 9.1 09/28/2017 0811  ? ALKPHOS 55 10/01/2021 0716  ?  ALKPHOS 75 09/28/2017 0811  ? AST 14 (L) 10/01/2021 0716  ? AST 23 09/28/2017 0811  ? ALT 17 10/01/2021 0716  ? ALT 23 09/28/2017 0811  ? BILITOT 0.5 10/01/2021 0716  ? BILITOT 0.46 09/28/2017 0811  ?  ? ? ? ? ? ? ? ? ?Impression and Plan: ? ?76 year old woman with: ? ?1.  JAK2 positive essential thrombocythemia diagnosed in 2007.  ? ?Laboratory data from today personally reviewed and discussed with the patient.  Her platelet count remains elevated but less than they were in January 2023.  The rise in the platelets could indicate inflammatory process associated with her PMR rather than failure of hydroxyurea.  At this time I recommended continuing hydroxyurea with the same dose and schedule.  Complications associated with the elevated platelets or limited at this time. ? ?2.  Polymyalgia rheumatica: She is currently on 1 mg daily and managed by her primary care physician.  She has been evaluated by rheumatology. ? ? ? ? ? ?3.  Thrombosis prophylaxis: Risk is low given her previous history of lack of thrombosis.  Her platelet count remains reasonably under control with white cell count is also within normal range. ? ? ? ?4.  Follow-up: In 8 weeks for repeat follow-up. ? ? ?30   minutes were dedicated to this visit.  Time spent on reviewing laboratory data, disease status update, future plan of care discussion. ? ?Zola Button, MD ?3/9/20238:26 AM` ?

## 2021-12-04 ENCOUNTER — Other Ambulatory Visit: Payer: Self-pay | Admitting: Oncology

## 2021-12-04 MED ORDER — TRAMADOL HCL 50 MG PO TABS: 50.0000 mg | ORAL_TABLET | Freq: Four times a day (QID) | ORAL | 1 refills | Status: DC | PRN

## 2021-12-13 ENCOUNTER — Encounter: Payer: Self-pay | Admitting: Internal Medicine

## 2021-12-14 MED ORDER — PREDNISONE 1 MG PO TABS: 1.0000 mg | ORAL_TABLET | Freq: Every day | ORAL | 2 refills | Status: DC

## 2021-12-14 NOTE — Telephone Encounter (Signed)
Done

## 2022-01-22 ENCOUNTER — Ambulatory Visit
Admission: RE | Admit: 2022-01-22 | Discharge: 2022-01-22 | Disposition: A | Discharge status: Home / Self Care | Payer: Medicare Other | Source: Ambulatory Visit | Attending: Internal Medicine | Admitting: Internal Medicine

## 2022-01-22 DIAGNOSIS — Z1231 Encounter for screening mammogram for malignant neoplasm of breast: Secondary | ICD-10-CM | POA: Diagnosis not present

## 2022-01-27 ENCOUNTER — Other Ambulatory Visit: Payer: Self-pay

## 2022-01-27 ENCOUNTER — Inpatient Hospital Stay: Payer: Medicare Other | Attending: Oncology | Admitting: Oncology

## 2022-01-27 ENCOUNTER — Inpatient Hospital Stay: Payer: Medicare Other

## 2022-01-27 VITALS — BP 149/70 | HR 78 | Temp 97.2°F | Resp 16 | Wt 125.0 lb

## 2022-01-27 DIAGNOSIS — Z885 Allergy status to narcotic agent status: Secondary | ICD-10-CM | POA: Insufficient documentation

## 2022-01-27 DIAGNOSIS — D75839 Thrombocytosis, unspecified: Secondary | ICD-10-CM | POA: Insufficient documentation

## 2022-01-27 DIAGNOSIS — M353 Polymyalgia rheumatica: Secondary | ICD-10-CM | POA: Diagnosis not present

## 2022-01-27 DIAGNOSIS — D473 Essential (hemorrhagic) thrombocythemia: Secondary | ICD-10-CM

## 2022-01-27 DIAGNOSIS — Z888 Allergy status to other drugs, medicaments and biological substances status: Secondary | ICD-10-CM | POA: Diagnosis not present

## 2022-01-27 DIAGNOSIS — Z79899 Other long term (current) drug therapy: Secondary | ICD-10-CM | POA: Insufficient documentation

## 2022-01-27 DIAGNOSIS — R634 Abnormal weight loss: Secondary | ICD-10-CM | POA: Diagnosis not present

## 2022-01-27 DIAGNOSIS — D471 Chronic myeloproliferative disease: Secondary | ICD-10-CM | POA: Diagnosis not present

## 2022-01-27 DIAGNOSIS — Z880 Allergy status to penicillin: Secondary | ICD-10-CM | POA: Diagnosis not present

## 2022-01-27 DIAGNOSIS — E538 Deficiency of other specified B group vitamins: Secondary | ICD-10-CM

## 2022-01-27 LAB — CBC WITH DIFFERENTIAL (CANCER CENTER ONLY)
Abs Immature Granulocytes: 0.05 10*3/uL (ref 0.00–0.07)
Basophils Absolute: 0.1 10*3/uL (ref 0.0–0.1)
Basophils Relative: 1 %
Eosinophils Absolute: 0.2 10*3/uL (ref 0.0–0.5)
Eosinophils Relative: 2 %
HCT: 47.1 % — ABNORMAL HIGH (ref 36.0–46.0)
Hemoglobin: 16 g/dL — ABNORMAL HIGH (ref 12.0–15.0)
Immature Granulocytes: 1 %
Lymphocytes Relative: 15 %
Lymphs Abs: 1.3 10*3/uL (ref 0.7–4.0)
MCH: 36.5 pg — ABNORMAL HIGH (ref 26.0–34.0)
MCHC: 34 g/dL (ref 30.0–36.0)
MCV: 107.5 fL — ABNORMAL HIGH (ref 80.0–100.0)
Monocytes Absolute: 0.8 10*3/uL (ref 0.1–1.0)
Monocytes Relative: 9 %
Neutro Abs: 6.5 10*3/uL (ref 1.7–7.7)
Neutrophils Relative %: 72 %
Platelet Count: 628 10*3/uL — ABNORMAL HIGH (ref 150–400)
RBC: 4.38 MIL/uL (ref 3.87–5.11)
RDW: 15.5 % (ref 11.5–15.5)
WBC Count: 8.9 10*3/uL (ref 4.0–10.5)
nRBC: 0 % (ref 0.0–0.2)

## 2022-01-27 LAB — CMP (CANCER CENTER ONLY)
ALT: 13 U/L (ref 0–44)
AST: 18 U/L (ref 15–41)
Albumin: 4.3 g/dL (ref 3.5–5.0)
Alkaline Phosphatase: 95 U/L (ref 38–126)
Anion gap: 9 (ref 5–15)
BUN: 14 mg/dL (ref 8–23)
CO2: 27 mmol/L (ref 22–32)
Calcium: 9.6 mg/dL (ref 8.9–10.3)
Chloride: 104 mmol/L (ref 98–111)
Creatinine: 1.05 mg/dL — ABNORMAL HIGH (ref 0.44–1.00)
GFR, Estimated: 55 mL/min — ABNORMAL LOW (ref >60)
Glucose, Bld: 99 mg/dL (ref 70–99)
Potassium: 4 mmol/L (ref 3.5–5.1)
Sodium: 140 mmol/L (ref 135–145)
Total Bilirubin: 0.5 mg/dL (ref 0.3–1.2)
Total Protein: 7 g/dL (ref 6.5–8.1)

## 2022-01-27 LAB — VITAMIN B12: Vitamin B-12: 769 pg/mL (ref 180–914)

## 2022-01-27 NOTE — Progress Notes (Signed)
Hematology and Oncology Follow Up Visit ? ?Anna Fuller ?[id number] ?September 29, 1945 76 y.o. ?01/27/2022 8:03 AM ? ?   ? ?Principle Diagnosis: 76 year old woman with JAK2 positive myeloproliferative disorder presented with essential thrombocythemia  in 2007.  a. ? ?Secondary diagnosis: Polymyalgia rheumatica diagnosed in November 2022. ? ?Prior Therapy:  ? ?She is status post anagrelide therapy discontinued due to do intolerance 2008. ? ? ?Current therapy:  ? ?Hydroxyurea started in 2009.  She is currently on 1500 mg daily starting on 08/06/2016. ? ?Prednisone tapering doses.  She is currently on 1 mg daily ? ?Interim History: Anna Fuller returns today for a follow-up evaluation.  Since last visit, she continues to tolerate hydroxyurea without any major complaints.  She denies any nausea, vomiting or worsening fatigue.  Continues to have arthralgias and shoulder pain associated with polymyalgia.  She is currently on low-dose prednisone which has helped her symptoms at this time.  Her performance status and quality of life remains excellent. ? ? ? ? ? ? ? ?Medications: Reviewed without changes. ?Current Outpatient Prescriptions  ?Medication Sig Dispense Refill  ? aspirin 81 MG tablet Take 81 mg by mouth daily.        ? cholecalciferol (VITAMIN D) 1000 UNITS tablet Take 2,000 Units by mouth daily.      ? estrogens, conjugated, (PREMARIN) 0.3 MG tablet Take 1 tablet (0.3 mg total) by mouth daily. Take daily for 21 days then do not take for 7 days.  30 tablet  11  ? hydroxyurea (HYDREA) 500 MG capsule Take one tab twice a day.  Every other night take an additional tablet at night.  (Alternate '1000mg'$  and '1500mg'$ ).  120 capsule  3  ? simvastatin (ZOCOR) 40 MG tablet Take 1 tablet (40 mg total) by mouth at bedtime.  30 tablet  11  ? ?  ? ? ? ? ?Allergies: Reviewed with the patient again today. ?Allergies  ?Allergen Reactions  ? Codeine Nausea And Vomiting  ? Iodine Anaphylaxis  ?  Contrast dye  ? Penicillins Other (See  Comments)  ? ? ? ?Physical Exam: ? ? ? ? ? ? ? ? ? ? ? ? ? ? ?ECOG: 1 ? ? ? ?General appearance: Alert, awake without any distress. ?Head: Atraumatic without abnormalities ?Oropharynx: Without any thrush or ulcers. ?Eyes: No scleral icterus. ?Lymph nodes: No lymphadenopathy noted in the cervical, supraclavicular, or axillary nodes ?Heart:regular rate and rhythm, without any murmurs or gallops.   ?Lung: Clear to auscultation without any rhonchi, wheezes or dullness to percussion. ?Abdomin: Soft, nontender without any shifting dullness or ascites. ?Musculoskeletal: No clubbing or cyanosis. ?Neurological: No motor or sensory deficits. ?Skin: No rashes or lesions. ? ? ? ? ? ? ? ? ? ? ? ? ? ? ? ? ? ? ? ? ? ? ?Lab Results: ?Lab Results  ?Component Value Date  ? WBC 8.9 01/27/2022  ? HGB 16.0 (H) 01/27/2022  ? HCT 47.1 (H) 01/27/2022  ? MCV 107.5 (H) 01/27/2022  ? PLT 628 (H) 01/27/2022  ? ?  Chemistry   ?   ?Component Value Date/Time  ? NA 137 12/03/2021 0741  ? NA 140 09/28/2017 0811  ? K 4.2 12/03/2021 0741  ? K 3.8 09/28/2017 0811  ? CL 103 12/03/2021 0741  ? CL 105 01/30/2013 0825  ? CO2 26 12/03/2021 0741  ? CO2 25 09/28/2017 0811  ? BUN 18 12/03/2021 0741  ? BUN 14.6 09/28/2017 0811  ? CREATININE 0.98 12/03/2021 0741  ? CREATININE  1.15 (H) 11/18/2020 0911  ? CREATININE 0.9 09/28/2017 0811  ?    ?Component Value Date/Time  ? CALCIUM 9.8 12/03/2021 0741  ? CALCIUM 9.1 09/28/2017 0811  ? ALKPHOS 96 12/03/2021 0741  ? ALKPHOS 75 09/28/2017 0811  ? AST 14 (L) 12/03/2021 0741  ? AST 23 09/28/2017 0811  ? ALT 8 12/03/2021 0741  ? ALT 23 09/28/2017 0811  ? BILITOT 0.5 12/03/2021 0741  ? BILITOT 0.46 09/28/2017 0811  ?  ? ? ? ? ? ? ? ? ?Impression and Plan: ? ?76 year old woman with: ? ?1.  Myeloproliferative disorder diagnosed in 2007.  She was found to have JAK2 positive essential thrombocythemia. ? ?She continues to be on hydroxyurea with overall reasonable control.  Laboratory data from today personally reviewed and  discussed patient and she had a platelet count in the 600 range without any need for adjustments.  Risks and benefits of continuing hydroxyurea long-term were reviewed.  Complications that include GI toxicity, dermatological issues as well as bone marrow disease were reiterated.  She is agreeable to continue at this time. ? ?2.  Polymyalgia rheumatica: Pain is manageable currently on prednisone 1 mg daily. ? ? ? ? ?3.  Thrombosis prophylaxis: He is at low risk of thrombosis at this time given low white cell count and no thrombosis history. ? ?4.  Weight loss: We have discussed strategies to improve her nutritional intake.  Her appetite has been affected by her pain mostly. ? ? ? ?5.  Follow-up: She will return in 2 months for a follow-up. ? ? ?30   minutes were spent on this encounter.  The time was dedicated to reviewing laboratory data, disease status update and outlining future plan of care discussion. ? ?Zola Button, MD ?5/3/20238:03 AM` ?

## 2022-02-18 ENCOUNTER — Other Ambulatory Visit: Payer: Self-pay | Admitting: Oncology

## 2022-02-18 DIAGNOSIS — D473 Essential (hemorrhagic) thrombocythemia: Secondary | ICD-10-CM

## 2022-02-21 IMAGING — MG DIGITAL SCREENING BILAT W/ TOMO W/ CAD
8 series · 8 of 24 positions shown · non-contrast
Comparison: Previous exam(s).

CLINICAL DATA: Screening.

EXAM:
DIGITAL SCREENING BILATERAL MAMMOGRAM WITH TOMO AND CAD

[R CC synth-2D]
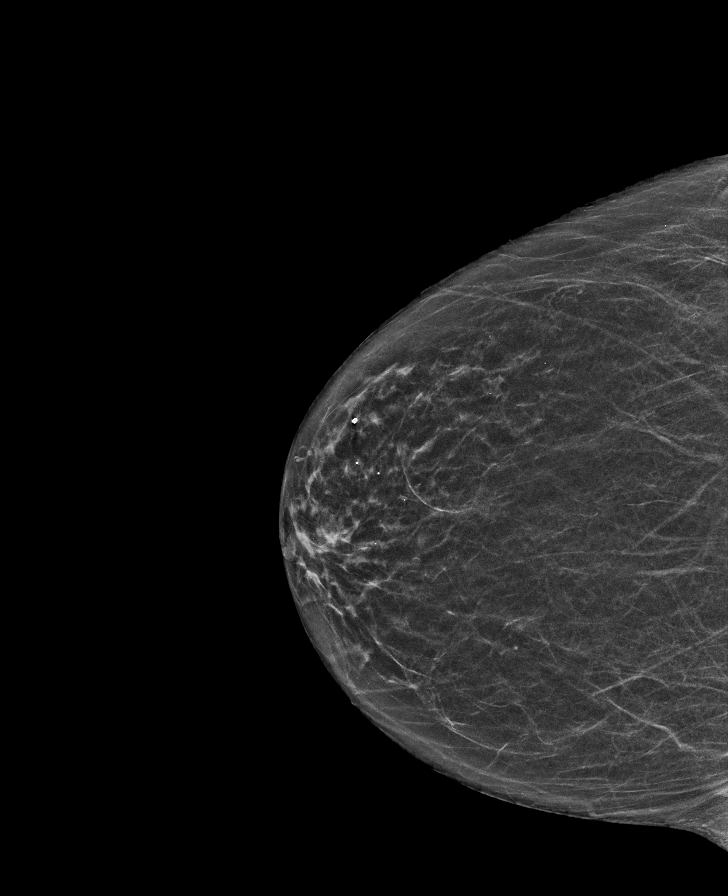

[L CC synth-2D]
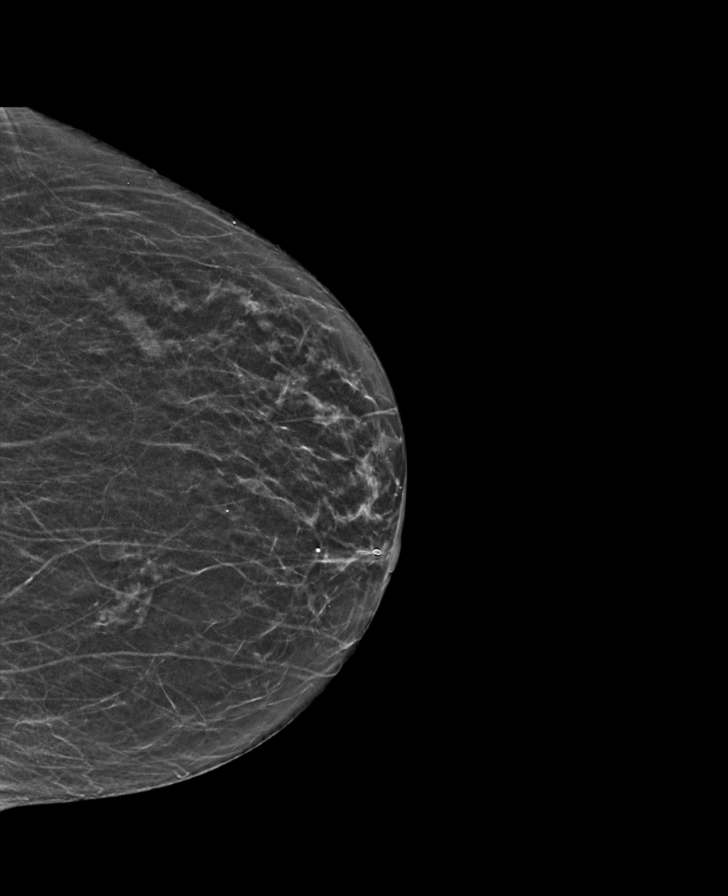

[R MLO synth-2D]
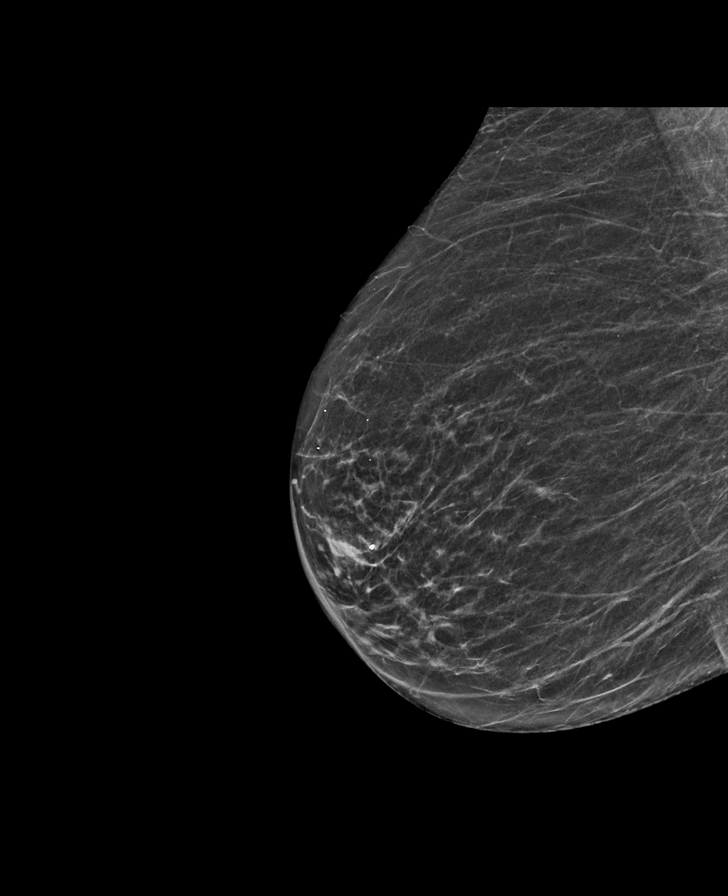

[L MLO synth-2D]
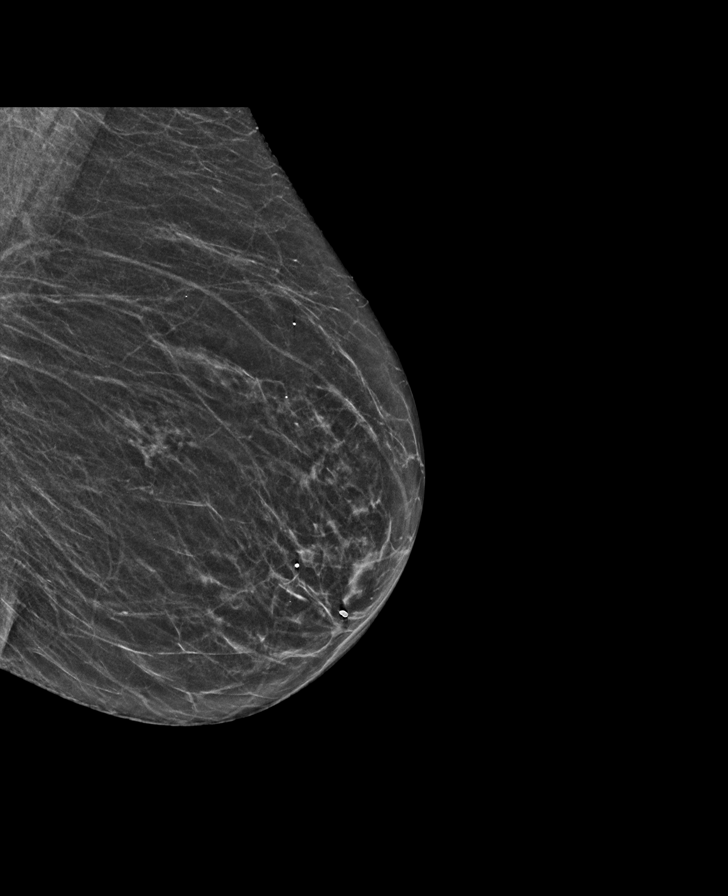

[R MLO tomo · tomo slice 25/50.0]
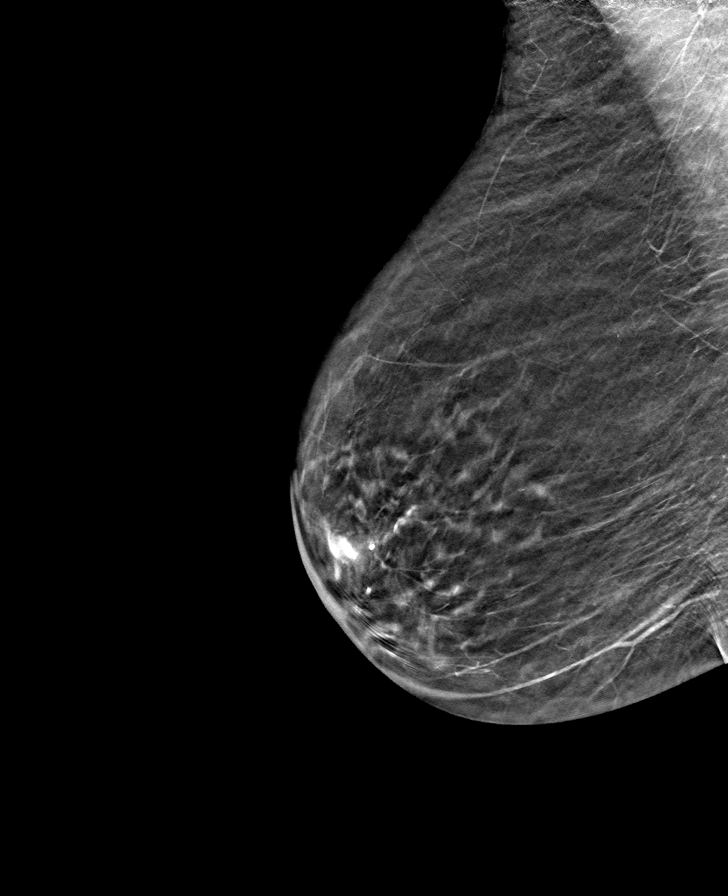

[L CC tomo · tomo slice 23/46.0]
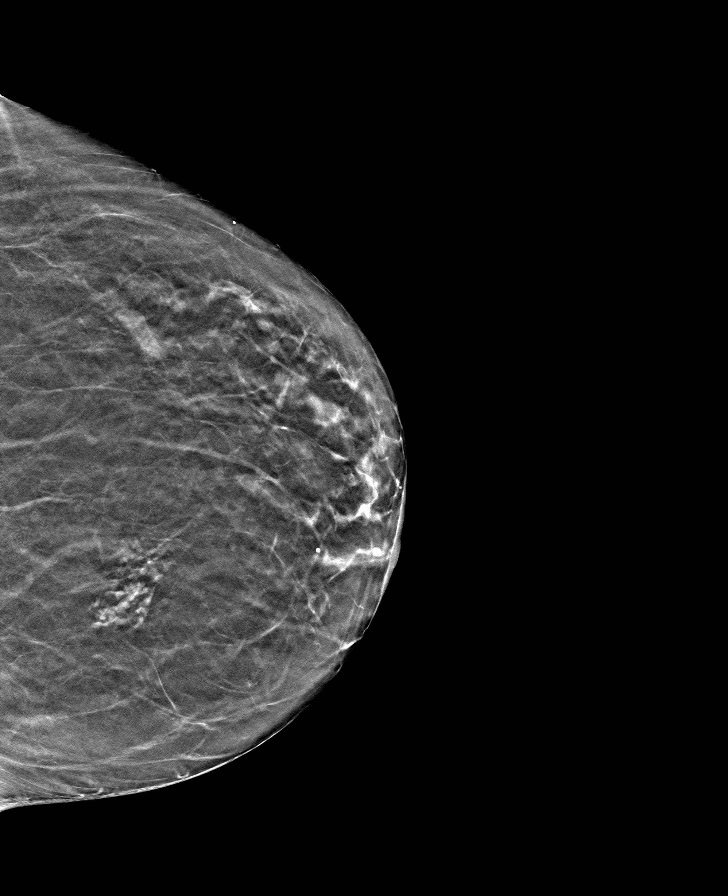

[R CC tomo · tomo slice 26/51.0]
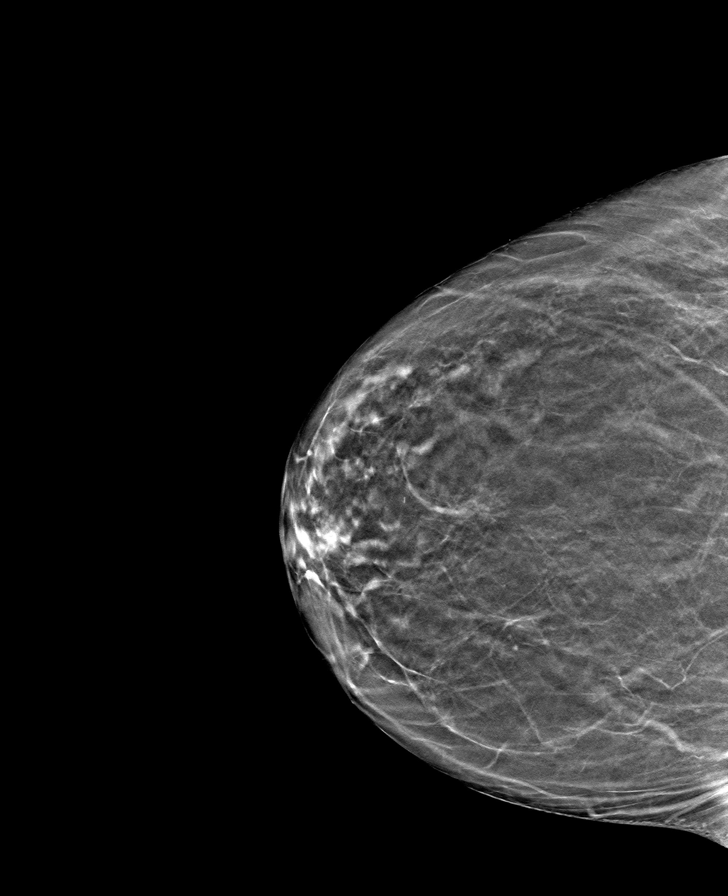

[L MLO tomo · tomo slice 26/51.0]
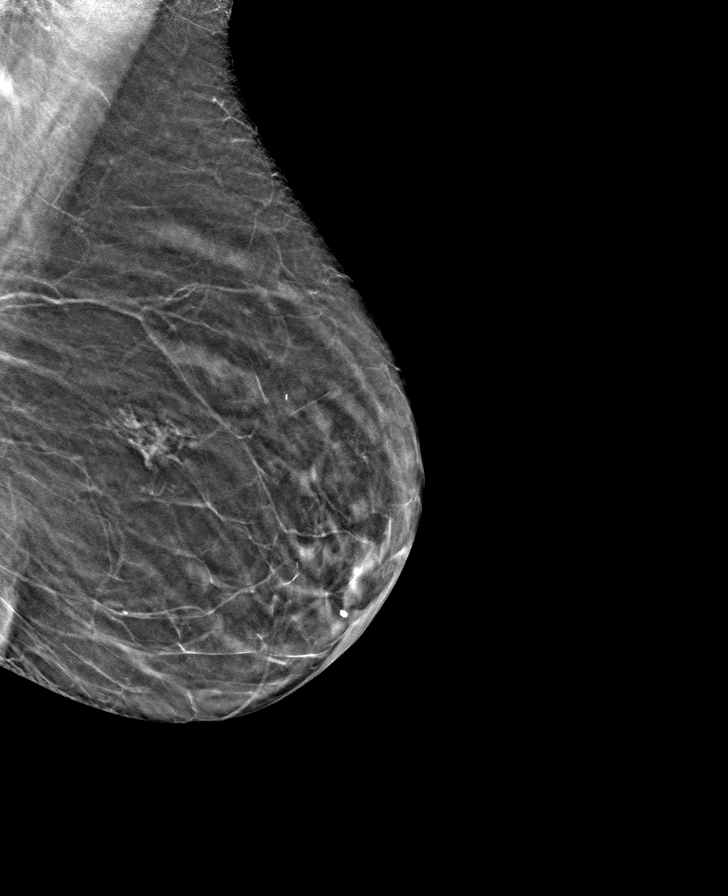

[8 of 24 positions shown; findings below may reference images not displayed]

ACR Breast Density Category b: There are scattered areas of
fibroglandular density.
FINDINGS: There are no findings suspicious for malignancy. Images were
processed with CAD.
IMPRESSION: No mammographic evidence of malignancy. A result letter of this
screening mammogram will be mailed directly to the patient.

RECOMMENDATION:
Screening mammogram in one year. (Code:[id number])

BI-RADS CATEGORY  1: Negative.

## 2022-03-05 DIAGNOSIS — L820 Inflamed seborrheic keratosis: Secondary | ICD-10-CM | POA: Diagnosis not present

## 2022-03-22 DIAGNOSIS — H5213 Myopia, bilateral: Secondary | ICD-10-CM | POA: Diagnosis not present

## 2022-03-22 DIAGNOSIS — H02834 Dermatochalasis of left upper eyelid: Secondary | ICD-10-CM | POA: Diagnosis not present

## 2022-03-22 DIAGNOSIS — H02831 Dermatochalasis of right upper eyelid: Secondary | ICD-10-CM | POA: Diagnosis not present

## 2022-03-22 DIAGNOSIS — H2513 Age-related nuclear cataract, bilateral: Secondary | ICD-10-CM | POA: Diagnosis not present

## 2022-03-31 ENCOUNTER — Other Ambulatory Visit: Payer: Self-pay

## 2022-03-31 ENCOUNTER — Inpatient Hospital Stay: Payer: Medicare Other

## 2022-03-31 ENCOUNTER — Inpatient Hospital Stay: Payer: Medicare Other | Attending: Oncology | Admitting: Oncology

## 2022-03-31 VITALS — BP 141/62 | HR 69 | Temp 97.6°F | Resp 16 | Ht 62.0 in | Wt 126.7 lb

## 2022-03-31 DIAGNOSIS — D473 Essential (hemorrhagic) thrombocythemia: Secondary | ICD-10-CM | POA: Diagnosis not present

## 2022-03-31 DIAGNOSIS — R634 Abnormal weight loss: Secondary | ICD-10-CM | POA: Insufficient documentation

## 2022-03-31 DIAGNOSIS — M353 Polymyalgia rheumatica: Secondary | ICD-10-CM | POA: Diagnosis not present

## 2022-03-31 DIAGNOSIS — Z888 Allergy status to other drugs, medicaments and biological substances status: Secondary | ICD-10-CM | POA: Insufficient documentation

## 2022-03-31 DIAGNOSIS — Z880 Allergy status to penicillin: Secondary | ICD-10-CM | POA: Diagnosis not present

## 2022-03-31 DIAGNOSIS — Z79899 Other long term (current) drug therapy: Secondary | ICD-10-CM | POA: Insufficient documentation

## 2022-03-31 DIAGNOSIS — D471 Chronic myeloproliferative disease: Secondary | ICD-10-CM | POA: Diagnosis not present

## 2022-03-31 DIAGNOSIS — Z885 Allergy status to narcotic agent status: Secondary | ICD-10-CM | POA: Diagnosis not present

## 2022-03-31 DIAGNOSIS — E538 Deficiency of other specified B group vitamins: Secondary | ICD-10-CM

## 2022-03-31 DIAGNOSIS — D75839 Thrombocytosis, unspecified: Secondary | ICD-10-CM | POA: Diagnosis not present

## 2022-03-31 LAB — CMP (CANCER CENTER ONLY)
ALT: 13 U/L (ref 0–44)
AST: 18 U/L (ref 15–41)
Albumin: 4.2 g/dL (ref 3.5–5.0)
Alkaline Phosphatase: 79 U/L (ref 38–126)
Anion gap: 7 (ref 5–15)
BUN: 18 mg/dL (ref 8–23)
CO2: 25 mmol/L (ref 22–32)
Calcium: 9.1 mg/dL (ref 8.9–10.3)
Chloride: 108 mmol/L (ref 98–111)
Creatinine: 1.13 mg/dL — ABNORMAL HIGH (ref 0.44–1.00)
GFR, Estimated: 50 mL/min — ABNORMAL LOW (ref >60)
Glucose, Bld: 94 mg/dL (ref 70–99)
Potassium: 4.1 mmol/L (ref 3.5–5.1)
Sodium: 140 mmol/L (ref 135–145)
Total Bilirubin: 0.6 mg/dL (ref 0.3–1.2)
Total Protein: 6.7 g/dL (ref 6.5–8.1)

## 2022-03-31 LAB — CBC WITH DIFFERENTIAL (CANCER CENTER ONLY)
Abs Immature Granulocytes: 0.03 10*3/uL (ref 0.00–0.07)
Basophils Absolute: 0.1 10*3/uL (ref 0.0–0.1)
Basophils Relative: 1 %
Eosinophils Absolute: 0.1 10*3/uL (ref 0.0–0.5)
Eosinophils Relative: 1 %
HCT: 45.5 % (ref 36.0–46.0)
Hemoglobin: 15.8 g/dL — ABNORMAL HIGH (ref 12.0–15.0)
Immature Granulocytes: 0 %
Lymphocytes Relative: 20 %
Lymphs Abs: 1.5 10*3/uL (ref 0.7–4.0)
MCH: 37.1 pg — ABNORMAL HIGH (ref 26.0–34.0)
MCHC: 34.7 g/dL (ref 30.0–36.0)
MCV: 106.8 fL — ABNORMAL HIGH (ref 80.0–100.0)
Monocytes Absolute: 0.7 10*3/uL (ref 0.1–1.0)
Monocytes Relative: 9 %
Neutro Abs: 5.3 10*3/uL (ref 1.7–7.7)
Neutrophils Relative %: 69 %
Platelet Count: 501 10*3/uL — ABNORMAL HIGH (ref 150–400)
RBC: 4.26 MIL/uL (ref 3.87–5.11)
RDW: 15.6 % — ABNORMAL HIGH (ref 11.5–15.5)
WBC Count: 7.7 10*3/uL (ref 4.0–10.5)
nRBC: 0 % (ref 0.0–0.2)

## 2022-03-31 LAB — VITAMIN B12: Vitamin B-12: 606 pg/mL (ref 180–914)

## 2022-03-31 NOTE — Progress Notes (Signed)
Hematology and Oncology Follow Up Visit  Anna Fuller [id number] 08-16-1946 76 y.o. 03/31/2022 7:29 AM      Principle Diagnosis: 76 year old woman with thrombocytosis diagnosed in 2007.  She was found to have JAK2 positive myeloproliferative disorder.   Secondary diagnosis: Polymyalgia rheumatica diagnosed in November 2022.  Prior Therapy:   She is status post anagrelide therapy discontinued due to do intolerance 2008.  High-dose prednisone due to polymyalgia rheumatica started in December 2022.  She was tapered off to 1 mg daily and was discontinued in May 2023.  Current therapy:   Hydroxyurea started in 2009.  She is currently on 1500 mg daily starting on 08/06/2016.    Interim History: Anna Fuller is here for a follow-up visit.  Since last visit, she reports no major changes in her health.  She is eating better and has gained more weight.  She denies any fevers or chills or sweats.  She denies any constitutional symptoms.  She does report arthralgias that have Been exacerbated slightly since she has been off of prednisone.  He remains reasonably active and attends activities of daily living.  She denies any recent hospitalizations or illnesses.        Medications: Updated on review. Current Outpatient Prescriptions  Medication Sig Dispense Refill   aspirin 81 MG tablet Take 81 mg by mouth daily.         cholecalciferol (VITAMIN D) 1000 UNITS tablet Take 2,000 Units by mouth daily.       estrogens, conjugated, (PREMARIN) 0.3 MG tablet Take 1 tablet (0.3 mg total) by mouth daily. Take daily for 21 days then do not take for 7 days.  30 tablet  11   hydroxyurea (HYDREA) 500 MG capsule Take one tab twice a day.  Every other night take an additional tablet at night.  (Alternate '1000mg'$  and '1500mg'$ ).  120 capsule  3   simvastatin (ZOCOR) 40 MG tablet Take 1 tablet (40 mg total) by mouth at bedtime.  30 tablet  11         Allergies: Reviewed with the patient again  today. Allergies  Allergen Reactions   Codeine Nausea And Vomiting   Iodine Anaphylaxis    Contrast dye   Penicillins Other (See Comments)     Physical Exam:       Blood pressure (!) 141/62, pulse 69, temperature 97.6 F (36.4 C), temperature source Temporal, resp. rate 16, height '5\' 2"'$  (1.575 m), weight 126 lb 11.2 oz (57.5 kg), SpO2 95 %.         ECOG: 1     General appearance: Comfortable appearing without any discomfort Head: Normocephalic without any trauma Oropharynx: Mucous membranes are moist and pink without any thrush or ulcers. Eyes: Pupils are equal and round reactive to light. Lymph nodes: No cervical, supraclavicular, inguinal or axillary lymphadenopathy.   Heart:regular rate and rhythm.  S1 and S2 without leg edema. Lung: Clear without any rhonchi or wheezes.  No dullness to percussion. Abdomin: Soft, nontender, nondistended with good bowel sounds.  No hepatosplenomegaly. Musculoskeletal: No joint deformity or effusion.  Full range of motion noted. Neurological: No deficits noted on motor, sensory and deep tendon reflex exam. Skin: No petechial rash or dryness.  Appeared moist.                        Lab Results: Lab Results  Component Value Date   WBC 8.9 01/27/2022   HGB 16.0 (H) 01/27/2022   HCT 47.1 (  H) 01/27/2022   MCV 107.5 (H) 01/27/2022   PLT 628 (H) 01/27/2022     Chemistry      Component Value Date/Time   NA 140 01/27/2022 0740   NA 140 09/28/2017 0811   K 4.0 01/27/2022 0740   K 3.8 09/28/2017 0811   CL 104 01/27/2022 0740   CL 105 01/30/2013 0825   CO2 27 01/27/2022 0740   CO2 25 09/28/2017 0811   BUN 14 01/27/2022 0740   BUN 14.6 09/28/2017 0811   CREATININE 1.05 (H) 01/27/2022 0740   CREATININE 1.15 (H) 11/18/2020 0911   CREATININE 0.9 09/28/2017 0811      Component Value Date/Time   CALCIUM 9.6 01/27/2022 0740   CALCIUM 9.1 09/28/2017 0811   ALKPHOS 95 01/27/2022 0740   ALKPHOS 75 09/28/2017  0811   AST 18 01/27/2022 0740   AST 23 09/28/2017 0811   ALT 13 01/27/2022 0740   ALT 23 09/28/2017 0811   BILITOT 0.5 01/27/2022 0740   BILITOT 0.46 09/28/2017 0811            Impression and Plan:  76 year old woman with:  1.  JAK2 positive myeloproliferative disorder diagnosed in 2007.    I disease status was updated at this time and treatment choices were reviewed.  He is currently on hydroxyurea which she has tolerated with few complaints but overall reasonable control of her platelet count.  Alternative treatment options including Jak 2 inhibitors among others were reviewed.  Platelet count currently at 501 and remains under excellent control with the current dose.  I do not recommend any changes continue the same dose and schedule.    2.  Polymyalgia rheumatica: She is off prednisone at this time with mild exacerbation.  I recommended follow-up with her primary care and possibly repeat return to rheumatology.     3.  Thrombosis prophylaxis: Risk of thrombosis remains low at this time.  White cell count is within normal range.  4.  Weight loss: Improved at this time is currently eating better and gained close to 2 pounds.    5.  Follow-up: In 2 months for repeat follow-up.   30   minutes were dedicated to this visit.  The time was spent on reviewing laboratory data, disease status update and outlining future plan of care discussion.  Zola Button, MD 7/5/20237:29 AM`

## 2022-06-01 ENCOUNTER — Inpatient Hospital Stay: Payer: Medicare Other | Attending: Oncology

## 2022-06-01 ENCOUNTER — Inpatient Hospital Stay (HOSPITAL_BASED_OUTPATIENT_CLINIC_OR_DEPARTMENT_OTHER): Payer: Medicare Other | Admitting: Oncology

## 2022-06-01 VITALS — BP 142/62 | HR 72 | Temp 98.0°F | Resp 15 | Ht 62.0 in | Wt 127.5 lb

## 2022-06-01 DIAGNOSIS — Z885 Allergy status to narcotic agent status: Secondary | ICD-10-CM | POA: Insufficient documentation

## 2022-06-01 DIAGNOSIS — D473 Essential (hemorrhagic) thrombocythemia: Secondary | ICD-10-CM | POA: Insufficient documentation

## 2022-06-01 DIAGNOSIS — Z880 Allergy status to penicillin: Secondary | ICD-10-CM | POA: Insufficient documentation

## 2022-06-01 DIAGNOSIS — D75839 Thrombocytosis, unspecified: Secondary | ICD-10-CM | POA: Insufficient documentation

## 2022-06-01 DIAGNOSIS — M353 Polymyalgia rheumatica: Secondary | ICD-10-CM | POA: Insufficient documentation

## 2022-06-01 DIAGNOSIS — M25551 Pain in right hip: Secondary | ICD-10-CM | POA: Diagnosis not present

## 2022-06-01 DIAGNOSIS — Z79899 Other long term (current) drug therapy: Secondary | ICD-10-CM | POA: Insufficient documentation

## 2022-06-01 DIAGNOSIS — Z888 Allergy status to other drugs, medicaments and biological substances status: Secondary | ICD-10-CM | POA: Insufficient documentation

## 2022-06-01 DIAGNOSIS — M25552 Pain in left hip: Secondary | ICD-10-CM | POA: Insufficient documentation

## 2022-06-01 DIAGNOSIS — D471 Chronic myeloproliferative disease: Secondary | ICD-10-CM | POA: Diagnosis not present

## 2022-06-01 DIAGNOSIS — R634 Abnormal weight loss: Secondary | ICD-10-CM | POA: Diagnosis not present

## 2022-06-01 DIAGNOSIS — E538 Deficiency of other specified B group vitamins: Secondary | ICD-10-CM

## 2022-06-01 LAB — CBC WITH DIFFERENTIAL (CANCER CENTER ONLY)
Abs Immature Granulocytes: 0.03 10*3/uL (ref 0.00–0.07)
Basophils Absolute: 0.1 10*3/uL (ref 0.0–0.1)
Basophils Relative: 1 %
Eosinophils Absolute: 0.1 10*3/uL (ref 0.0–0.5)
Eosinophils Relative: 1 %
HCT: 42.2 % (ref 36.0–46.0)
Hemoglobin: 14.7 g/dL (ref 12.0–15.0)
Immature Granulocytes: 1 %
Lymphocytes Relative: 25 %
Lymphs Abs: 1.6 10*3/uL (ref 0.7–4.0)
MCH: 38.3 pg — ABNORMAL HIGH (ref 26.0–34.0)
MCHC: 34.8 g/dL (ref 30.0–36.0)
MCV: 109.9 fL — ABNORMAL HIGH (ref 80.0–100.0)
Monocytes Absolute: 0.6 10*3/uL (ref 0.1–1.0)
Monocytes Relative: 9 %
Neutro Abs: 4.1 10*3/uL (ref 1.7–7.7)
Neutrophils Relative %: 63 %
Platelet Count: 599 10*3/uL — ABNORMAL HIGH (ref 150–400)
RBC: 3.84 MIL/uL — ABNORMAL LOW (ref 3.87–5.11)
RDW: 14.6 % (ref 11.5–15.5)
WBC Count: 6.5 10*3/uL (ref 4.0–10.5)
nRBC: 0 % (ref 0.0–0.2)

## 2022-06-01 LAB — CMP (CANCER CENTER ONLY)
ALT: 13 U/L (ref 0–44)
AST: 19 U/L (ref 15–41)
Albumin: 4.3 g/dL (ref 3.5–5.0)
Alkaline Phosphatase: 82 U/L (ref 38–126)
Anion gap: 6 (ref 5–15)
BUN: 15 mg/dL (ref 8–23)
CO2: 25 mmol/L (ref 22–32)
Calcium: 9.3 mg/dL (ref 8.9–10.3)
Chloride: 107 mmol/L (ref 98–111)
Creatinine: 0.9 mg/dL (ref 0.44–1.00)
GFR, Estimated: >60 mL/min (ref >60)
Glucose, Bld: 94 mg/dL (ref 70–99)
Potassium: 4.1 mmol/L (ref 3.5–5.1)
Sodium: 138 mmol/L (ref 135–145)
Total Bilirubin: 0.5 mg/dL (ref 0.3–1.2)
Total Protein: 6.6 g/dL (ref 6.5–8.1)

## 2022-06-01 LAB — VITAMIN B12: Vitamin B-12: 526 pg/mL (ref 180–914)

## 2022-06-01 NOTE — Progress Notes (Signed)
Hematology and Oncology Follow Up Visit  Anna Fuller [id number] 1945-10-16 76 y.o. 06/01/2022 7:47 AM      Principle Diagnosis: 76 year old woman with JAK2 positive myeloproliferative disorder diagnosed in 2007.  She presented with thrombocytosis at that time.   Secondary diagnosis: Polymyalgia rheumatica diagnosed in November 2022.  Prior Therapy:   She is status post anagrelide therapy discontinued due to do intolerance 2008.  High-dose prednisone due to polymyalgia rheumatica started in December 2022.  She was tapered off to 1 mg daily and was discontinued in May 2023.  Current therapy:   Hydroxyurea started in 2009.  She is currently on 1500 mg daily starting on 08/06/2016.    Interim History: Anna Fuller presents today for repeat evaluation.  Since last visit, she reports no major changes in her health.  She still has a residual pain in her shoulders and hips related to polymyalgia but overall pain is manageable.  She has refused to continue prednisone therapy.  She tolerated hydroxyurea without any complaints.  She denies any fevers or chills or sweats.  She denies any hospitalizations or illnesses.       Medications: Reviewed without changes. Current Outpatient Prescriptions  Medication Sig Dispense Refill   aspirin 81 MG tablet Take 81 mg by mouth daily.         cholecalciferol (VITAMIN D) 1000 UNITS tablet Take 2,000 Units by mouth daily.       estrogens, conjugated, (PREMARIN) 0.3 MG tablet Take 1 tablet (0.3 mg total) by mouth daily. Take daily for 21 days then do not take for 7 days.  30 tablet  11   hydroxyurea (HYDREA) 500 MG capsule Take one tab twice a day.  Every other night take an additional tablet at night.  (Alternate '1000mg'$  and '1500mg'$ ).  120 capsule  3   simvastatin (ZOCOR) 40 MG tablet Take 1 tablet (40 mg total) by mouth at bedtime.  30 tablet  11         Allergies: Reviewed with the patient again today. Allergies  Allergen Reactions    Codeine Nausea And Vomiting   Iodine Anaphylaxis    Contrast dye   Penicillins Other (See Comments)     Physical Exam:        Blood pressure (!) 142/62, pulse 72, temperature 98 F (36.7 C), temperature source Temporal, resp. rate 15, height '5\' 2"'$  (1.575 m), weight 127 lb 8 oz (57.8 kg), SpO2 95 %.         ECOG: 1   General appearance: Alert, awake without any distress. Head: Atraumatic without abnormalities Oropharynx: Without any thrush or ulcers. Eyes: No scleral icterus. Lymph nodes: No lymphadenopathy noted in the cervical, supraclavicular, or axillary nodes Heart:regular rate and rhythm, without any murmurs or gallops.   Lung: Clear to auscultation without any rhonchi, wheezes or dullness to percussion. Abdomin: Soft, nontender without any shifting dullness or ascites. Musculoskeletal: No clubbing or cyanosis. Neurological: No motor or sensory deficits. Skin: No rashes or lesions.                       Lab Results: Lab Results  Component Value Date   WBC 7.7 03/31/2022   HGB 15.8 (H) 03/31/2022   HCT 45.5 03/31/2022   MCV 106.8 (H) 03/31/2022   PLT 501 (H) 03/31/2022     Chemistry      Component Value Date/Time   NA 140 03/31/2022 0745   NA 140 09/28/2017 0811   K 4.1 03/31/2022  0745   K 3.8 09/28/2017 0811   CL 108 03/31/2022 0745   CL 105 01/30/2013 0825   CO2 25 03/31/2022 0745   CO2 25 09/28/2017 0811   BUN 18 03/31/2022 0745   BUN 14.6 09/28/2017 0811   CREATININE 1.13 (H) 03/31/2022 0745   CREATININE 1.15 (H) 11/18/2020 0911   CREATININE 0.9 09/28/2017 0811      Component Value Date/Time   CALCIUM 9.1 03/31/2022 0745   CALCIUM 9.1 09/28/2017 0811   ALKPHOS 79 03/31/2022 0745   ALKPHOS 75 09/28/2017 0811   AST 18 03/31/2022 0745   AST 23 09/28/2017 0811   ALT 13 03/31/2022 0745   ALT 23 09/28/2017 0811   BILITOT 0.6 03/31/2022 0745   BILITOT 0.46 09/28/2017 0811            Impression and  Plan:  76 year old woman with:  1.  Essential thrombocythemia diagnosed in 2007.  She was found to have JAK2 positive disease.  She continues to be on hydroxyurea with disease in the reasonable control.  Risks and benefits of continuing this treatment versus switching to a Jak 2 inhibitor.  Overall she has tolerated this therapy and she is agreeable to continue.  Laboratory data from today reviewed and showed a platelet count of 599 which showed remains close to her baseline and in the safe range.  He is agreeable to continue.     2.  Polymyalgia rheumatica: She is off treatment at this time and her pain is manageable.  I recommended follow-up with rheumatology if she has a flareup in her pain.    3.  Thrombosis prophylaxis: Her risk of thrombosis remains stable at this time without any exacerbation.  Her white cell count remains within normal range.  4.  Weight loss: Her appetite is reasonable and weight is stable.    5.  Follow-up: She will return in 2 months for a follow-up.   30   minutes were spent on this encounter.  The time was dedicated to reviewing laboratory data, disease status update and outlining future plan of care discussion.  Zola Button, MD 9/5/20237:47 AM`

## 2022-06-02 ENCOUNTER — Ambulatory Visit (INDEPENDENT_AMBULATORY_CARE_PROVIDER_SITE_OTHER): Payer: Medicare Other

## 2022-06-02 VITALS — BP 130/66 | Temp 97.8°F

## 2022-06-02 DIAGNOSIS — Z23 Encounter for immunization: Secondary | ICD-10-CM

## 2022-07-01 DIAGNOSIS — Z23 Encounter for immunization: Secondary | ICD-10-CM | POA: Diagnosis not present

## 2022-07-30 ENCOUNTER — Inpatient Hospital Stay (HOSPITAL_BASED_OUTPATIENT_CLINIC_OR_DEPARTMENT_OTHER): Payer: Medicare Other | Admitting: Oncology

## 2022-07-30 ENCOUNTER — Inpatient Hospital Stay: Payer: Medicare Other | Attending: Oncology

## 2022-07-30 VITALS — BP 184/67 | HR 65 | Temp 98.0°F | Resp 19 | Wt 126.5 lb

## 2022-07-30 DIAGNOSIS — D473 Essential (hemorrhagic) thrombocythemia: Secondary | ICD-10-CM

## 2022-07-30 DIAGNOSIS — Z888 Allergy status to other drugs, medicaments and biological substances status: Secondary | ICD-10-CM | POA: Diagnosis not present

## 2022-07-30 DIAGNOSIS — Z880 Allergy status to penicillin: Secondary | ICD-10-CM | POA: Insufficient documentation

## 2022-07-30 DIAGNOSIS — D471 Chronic myeloproliferative disease: Secondary | ICD-10-CM | POA: Insufficient documentation

## 2022-07-30 DIAGNOSIS — R634 Abnormal weight loss: Secondary | ICD-10-CM | POA: Diagnosis not present

## 2022-07-30 DIAGNOSIS — Z885 Allergy status to narcotic agent status: Secondary | ICD-10-CM | POA: Diagnosis not present

## 2022-07-30 DIAGNOSIS — Z79899 Other long term (current) drug therapy: Secondary | ICD-10-CM | POA: Insufficient documentation

## 2022-07-30 DIAGNOSIS — M353 Polymyalgia rheumatica: Secondary | ICD-10-CM | POA: Insufficient documentation

## 2022-07-30 DIAGNOSIS — E538 Deficiency of other specified B group vitamins: Secondary | ICD-10-CM

## 2022-07-30 DIAGNOSIS — D75839 Thrombocytosis, unspecified: Secondary | ICD-10-CM | POA: Insufficient documentation

## 2022-07-30 LAB — CMP (CANCER CENTER ONLY)
ALT: 10 U/L (ref 0–44)
AST: 16 U/L (ref 15–41)
Albumin: 4.5 g/dL (ref 3.5–5.0)
Alkaline Phosphatase: 94 U/L (ref 38–126)
Anion gap: 7 (ref 5–15)
BUN: 20 mg/dL (ref 8–23)
CO2: 26 mmol/L (ref 22–32)
Calcium: 9.4 mg/dL (ref 8.9–10.3)
Chloride: 105 mmol/L (ref 98–111)
Creatinine: 0.93 mg/dL (ref 0.44–1.00)
GFR, Estimated: >60 mL/min (ref >60)
Glucose, Bld: 92 mg/dL (ref 70–99)
Potassium: 4.3 mmol/L (ref 3.5–5.1)
Sodium: 138 mmol/L (ref 135–145)
Total Bilirubin: 0.6 mg/dL (ref 0.3–1.2)
Total Protein: 7.2 g/dL (ref 6.5–8.1)

## 2022-07-30 LAB — CBC WITH DIFFERENTIAL (CANCER CENTER ONLY)
Abs Immature Granulocytes: 0.02 10*3/uL (ref 0.00–0.07)
Basophils Absolute: 0.1 10*3/uL (ref 0.0–0.1)
Basophils Relative: 1 %
Eosinophils Absolute: 0.1 10*3/uL (ref 0.0–0.5)
Eosinophils Relative: 2 %
HCT: 43.3 % (ref 36.0–46.0)
Hemoglobin: 14.9 g/dL (ref 12.0–15.0)
Immature Granulocytes: 0 %
Lymphocytes Relative: 24 %
Lymphs Abs: 1.6 10*3/uL (ref 0.7–4.0)
MCH: 39.5 pg — ABNORMAL HIGH (ref 26.0–34.0)
MCHC: 34.4 g/dL (ref 30.0–36.0)
MCV: 114.9 fL — ABNORMAL HIGH (ref 80.0–100.0)
Monocytes Absolute: 0.8 10*3/uL (ref 0.1–1.0)
Monocytes Relative: 12 %
Neutro Abs: 4.2 10*3/uL (ref 1.7–7.7)
Neutrophils Relative %: 61 %
Platelet Count: 587 10*3/uL — ABNORMAL HIGH (ref 150–400)
RBC: 3.77 MIL/uL — ABNORMAL LOW (ref 3.87–5.11)
RDW: 14.6 % (ref 11.5–15.5)
WBC Count: 6.8 10*3/uL (ref 4.0–10.5)
nRBC: 0 % (ref 0.0–0.2)

## 2022-07-30 LAB — VITAMIN B12: Vitamin B-12: 706 pg/mL (ref 180–914)

## 2022-07-30 NOTE — Progress Notes (Signed)
Hematology and Oncology Follow Up Visit  Anna Fuller [id number] 11/17/1945 76 y.o. 07/30/2022 8:08 AM      Principle Diagnosis: 76 year old woman with essential thrombocythemia diagnosed in 2007.  She was found to have JAK2 positive myeloproliferative disorder    Secondary diagnosis: Polymyalgia rheumatica diagnosed in November 2022.  Prior Therapy:   Anagrelide initiated briefly and discontinued due to do intolerance 2008.  High-dose prednisone due to polymyalgia rheumatica started in December 2022.  She was tapered off to 1 mg daily and was discontinued in May 2023.  Current therapy:   Hydroxyurea started in 2009.  She is currently on 1500 mg daily starting on 08/06/2016.    Interim History: Anna Fuller returns today for a follow-up visit.  Since last visit, she reports no major changes in her health.  She denies any nausea, vomiting or abdominal pain.  She did have knee discomfort related to physical activity but no falls or trauma.  She denies any complication related to hydroxyurea.  She has reported some shoulder tightness and stiffness but no other complaints.       Medications: Updated on review. Current Outpatient Prescriptions  Medication Sig Dispense Refill   aspirin 81 MG tablet Take 81 mg by mouth daily.         cholecalciferol (VITAMIN D) 1000 UNITS tablet Take 2,000 Units by mouth daily.       estrogens, conjugated, (PREMARIN) 0.3 MG tablet Take 1 tablet (0.3 mg total) by mouth daily. Take daily for 21 days then do not take for 7 days.  30 tablet  11   hydroxyurea (HYDREA) 500 MG capsule Take one tab twice a day.  Every other night take an additional tablet at night.  (Alternate '1000mg'$  and '1500mg'$ ).  120 capsule  3   simvastatin (ZOCOR) 40 MG tablet Take 1 tablet (40 mg total) by mouth at bedtime.  30 tablet  11         Allergies: Reviewed with the patient again today. Allergies  Allergen Reactions   Codeine Nausea And Vomiting   Iodine Anaphylaxis     Contrast dye   Penicillins Other (See Comments)     Physical Exam:           Blood pressure (!) 184/67, pulse 65, temperature 98 F (36.7 C), temperature source Oral, resp. rate 19, weight 126 lb 8 oz (57.4 kg), SpO2 99 %.       ECOG: 1   General appearance: Comfortable appearing without any discomfort Head: Normocephalic without any trauma Oropharynx: Mucous membranes are moist and pink without any thrush or ulcers. Eyes: Pupils are equal and round reactive to light. Lymph nodes: No cervical, supraclavicular, inguinal or axillary lymphadenopathy.   Heart:regular rate and rhythm.  S1 and S2 without leg edema. Lung: Clear without any rhonchi or wheezes.  No dullness to percussion. Abdomin: Soft, nontender, nondistended with good bowel sounds.  No hepatosplenomegaly. Musculoskeletal: No joint deformity or effusion.  Full range of motion noted. Neurological: No deficits noted on motor, sensory and deep tendon reflex exam. Skin: No petechial rash or dryness.  Appeared moist.                        Lab Results: Lab Results  Component Value Date   WBC 6.8 07/30/2022   HGB 14.9 07/30/2022   HCT 43.3 07/30/2022   MCV 114.9 (H) 07/30/2022   PLT 587 (H) 07/30/2022     Chemistry  Component Value Date/Time   NA 138 06/01/2022 0749   NA 140 09/28/2017 0811   K 4.1 06/01/2022 0749   K 3.8 09/28/2017 0811   CL 107 06/01/2022 0749   CL 105 01/30/2013 0825   CO2 25 06/01/2022 0749   CO2 25 09/28/2017 0811   BUN 15 06/01/2022 0749   BUN 14.6 09/28/2017 0811   CREATININE 0.90 06/01/2022 0749   CREATININE 1.15 (H) 11/18/2020 0911   CREATININE 0.9 09/28/2017 0811      Component Value Date/Time   CALCIUM 9.3 06/01/2022 0749   CALCIUM 9.1 09/28/2017 0811   ALKPHOS 82 06/01/2022 0749   ALKPHOS 75 09/28/2017 0811   AST 19 06/01/2022 0749   AST 23 09/28/2017 0811   ALT 13 06/01/2022 0749   ALT 23 09/28/2017 0811   BILITOT 0.5 06/01/2022  0749   BILITOT 0.46 09/28/2017 0811            Impression and Plan:  76 year old woman with:  1.  JAK2 positive essential thrombocythemia diagnosed in 2007.    The natural course of her disease and treatment choices were discussed at this time.  She is currently on hydroxyurea with few complaints but overall manageable.  Her platelet count today remains under reasonable control of 582.  Increasing the dose of hydroxyurea was discussed but deferred given her tolerance as well as overall adequate counts.  Jak 2 inhibitors could be also considered for constitutional symptoms.     2.  Polymyalgia rheumatica: No flareups noted at this time.  She is off prednisone.    3.  Thrombosis prophylaxis: Her white cell count is normal and the risk of thrombosis remains low.  4.  Weight loss: Her appetite is better and weight is overall stable.    5.  Follow-up: In 2 months for a follow-up visit.   30   minutes were dedicated to this visit.  The time was spent on updating disease status, treatment choices and outlining future plan of care discussion.  Zola Button, MD 11/3/20238:08 AM`

## 2022-08-05 ENCOUNTER — Encounter: Payer: Self-pay | Admitting: Internal Medicine

## 2022-08-16 ENCOUNTER — Ambulatory Visit (INDEPENDENT_AMBULATORY_CARE_PROVIDER_SITE_OTHER): Payer: Medicare Other

## 2022-08-16 ENCOUNTER — Encounter: Payer: Self-pay | Admitting: Orthopaedic Surgery

## 2022-08-16 ENCOUNTER — Ambulatory Visit (INDEPENDENT_AMBULATORY_CARE_PROVIDER_SITE_OTHER): Payer: Medicare Other | Admitting: Orthopaedic Surgery

## 2022-08-16 DIAGNOSIS — M25562 Pain in left knee: Secondary | ICD-10-CM

## 2022-08-16 DIAGNOSIS — G8929 Other chronic pain: Secondary | ICD-10-CM | POA: Diagnosis not present

## 2022-08-16 MED ORDER — LIDOCAINE HCL 1 % IJ SOLN
3.0000 mL | INTRAMUSCULAR | Status: AC | PRN
  Administered 2022-08-16: 3 mL

## 2022-08-16 MED ORDER — METHYLPREDNISOLONE ACETATE 40 MG/ML IJ SUSP
40.0000 mg | INTRAMUSCULAR | Status: AC | PRN
  Administered 2022-08-16: 40 mg via INTRA_ARTICULAR

## 2022-08-16 NOTE — Progress Notes (Signed)
Office Visit Note   Patient: Anna Fuller           Date of Birth: 12-23-1945           MRN: [id number] Visit Date: 08/16/2022              Requested by: Elby Showers, MD [address] [place],  [place] [id number] PCP: Elby Showers, MD   Assessment & Plan: Visit Diagnoses:  1. Chronic pain of left knee     Plan: I was able to aspirate about 15 to 20 cc of fluid from her knee and placed a sterile injection in her left knee.  She will work on activity modification.  All question concerns were answered and addressed.  Follow-up can be as needed.  If things worsen she knows to let us know.  Follow-Up Instructions: Return if symptoms worsen or fail to improve.   Orders:  Orders Placed This Encounter  Procedures   Large Joint Inj   XR Knee 1-2 Views Left   No orders of the defined types were placed in this encounter.     Procedures: Large Joint Inj: L knee on 08/16/2022 9:39 AM Indications: diagnostic evaluation and pain Details: 22 G 1.5 in needle, superolateral approach  Arthrogram: No  Medications: 3 mL lidocaine 1 %; 40 mg methylPREDNISolone acetate 40 MG/ML Outcome: tolerated well, no immediate complications Procedure, treatment alternatives, risks and benefits explained, specific risks discussed. Consent was given by the patient. Immediately prior to procedure a time out was called to verify the correct patient, procedure, equipment, support staff and site/side marked as required. Patient was prepped and draped in the usual sterile fashion.       Clinical Data: No additional findings.   Subjective: Chief Complaint  Patient presents with   Left Knee - Pain  The patient is a very pleasant and active 76 year old female who has a 30-monthhistory of left knee pain with no known injury.  She said it started to hurt after mowing the lawn has been slightly getting worse.  She says he does help and Aleve has helped.  She denies any locking catching.  She  is not walking with any assistive device.  She feels like there is some slight swelling in that left knee.  HPI  Review of Systems There is currently listed no fever, chills, nausea, vomiting  Objective: Vital Signs: There were no vitals taken for this visit.  Physical Exam She is alert and orient x3 and in no acute distress Ortho Exam Examination of her left knee shows a mild effusion.  There is excellent range of motion of the knee and the knee is ligamentously stable. Specialty Comments:  No specialty comments available.  Imaging: XR Knee 1-2 Views Left  Result Date: 08/16/2022 2 views of the left knee show no acute findings.  There is slight effusion.  The joint spaces well-maintained.  The bone is osteopenic.    PMFS History: Patient Active Problem List   Diagnosis Date Noted   JAK2 gene mutation 09/27/2015   Essential thrombocytosis ([facility] 09/27/2015   Encounter for monitoring postmenopausal estrogen replacement therapy 08/19/2012   Hyperlipidemia 08/19/2012   Adenomatous polyp of colon 08/18/2012   Past Medical History:  Diagnosis Date   Cancer ([facility]    essential thrombocythemia   Hyperlipidemia    Macular degeneration    Neuromuscular disorder ([facility]    chemo induced neuropathy   Thrombocytosis     Family History  Problem Relation  Age of Onset   Pulmonary fibrosis Mother    Colon polyps Mother    Diabetes Father    Esophageal cancer Maternal Uncle    Breast cancer Sister    Colon cancer Neg Hx    Stomach cancer Neg Hx    Rectal cancer Neg Hx     Past Surgical History:  Procedure Laterality Date   ABDOMINAL HYSTERECTOMY     CHOLECYSTECTOMY     COLONOSCOPY     extra bones removed     on feet, knees and wrist   POLYPECTOMY     TONSILLECTOMY     TUMOR EXCISION     left leg   Social History   Occupational History   Not on file  Tobacco Use   Smoking status: Former    Types: Cigarettes    Quit date: 08/18/2002    Years since quitting: 20.0    Smokeless tobacco: Never  Vaping Use   Vaping Use: Never used  Substance and Sexual Activity   Alcohol use: Yes    Alcohol/week: 0.0 standard drinks of alcohol    Comment: rare   Drug use: No   Sexual activity: Never

## 2022-09-29 ENCOUNTER — Ambulatory Visit (INDEPENDENT_AMBULATORY_CARE_PROVIDER_SITE_OTHER): Payer: Medicare Other | Admitting: Orthopaedic Surgery

## 2022-09-29 ENCOUNTER — Encounter: Payer: Self-pay | Admitting: Orthopaedic Surgery

## 2022-09-29 DIAGNOSIS — M25562 Pain in left knee: Secondary | ICD-10-CM

## 2022-09-29 NOTE — Progress Notes (Signed)
HPI: [name] comes in today for follow-up of her left knee injection 08/16/2022.  She states the injection did not help.  She notes that the knee is swollen again.  She denies any real mechanical symptoms in the knee.  Notes that she has pain in the medial and posterior medial aspect of the knee.  Left knee pain has been ongoing for approximately 3-1/2 to 4 months now.  No known injury.  Pain started after mowing the lawn.  She states that her knee pain overall is becoming worse.  Radiographs left knee dated 08/16/2022 showed no acute findings.  Knee joint well preserved.  Osteopenic bone.  Review of systems: See HPI otherwise negative    Physical exam: General: Well-developed well-nourished female no acute distress.  Walks without any assistive devices.  Left knee positive effusion.  No abnormal warmth erythema.  Good range of motion.  Tenderness along medial joint line.  No instability valgus varus stressing.  McMurray's positive.  Impression: Left knee pain  Plan: Given patient's recurrent effusion, relative rest and time along with normal appearing knee on radiographs recommend MRI.  MRI of the left knee is to evaluate cartilage and also rule out meniscal tear.  Have her follow-up after the MRI to go over results discuss further treatment.  Questions were encouraged and answered

## 2022-09-30 ENCOUNTER — Inpatient Hospital Stay (HOSPITAL_BASED_OUTPATIENT_CLINIC_OR_DEPARTMENT_OTHER): Payer: Medicare Other | Admitting: Oncology

## 2022-09-30 ENCOUNTER — Inpatient Hospital Stay: Payer: Medicare Other | Attending: Oncology

## 2022-09-30 VITALS — BP 129/50 | HR 78 | Temp 97.8°F | Resp 16 | Ht 62.0 in | Wt 124.1 lb

## 2022-09-30 DIAGNOSIS — R634 Abnormal weight loss: Secondary | ICD-10-CM | POA: Insufficient documentation

## 2022-09-30 DIAGNOSIS — M25562 Pain in left knee: Secondary | ICD-10-CM | POA: Insufficient documentation

## 2022-09-30 DIAGNOSIS — Z880 Allergy status to penicillin: Secondary | ICD-10-CM | POA: Insufficient documentation

## 2022-09-30 DIAGNOSIS — D473 Essential (hemorrhagic) thrombocythemia: Secondary | ICD-10-CM

## 2022-09-30 DIAGNOSIS — M353 Polymyalgia rheumatica: Secondary | ICD-10-CM | POA: Diagnosis not present

## 2022-09-30 DIAGNOSIS — D471 Chronic myeloproliferative disease: Secondary | ICD-10-CM | POA: Insufficient documentation

## 2022-09-30 DIAGNOSIS — D75839 Thrombocytosis, unspecified: Secondary | ICD-10-CM | POA: Diagnosis not present

## 2022-09-30 DIAGNOSIS — Z91041 Radiographic dye allergy status: Secondary | ICD-10-CM | POA: Diagnosis not present

## 2022-09-30 DIAGNOSIS — Z79899 Other long term (current) drug therapy: Secondary | ICD-10-CM | POA: Diagnosis not present

## 2022-09-30 DIAGNOSIS — Z888 Allergy status to other drugs, medicaments and biological substances status: Secondary | ICD-10-CM | POA: Insufficient documentation

## 2022-09-30 DIAGNOSIS — Z885 Allergy status to narcotic agent status: Secondary | ICD-10-CM | POA: Diagnosis not present

## 2022-09-30 DIAGNOSIS — E538 Deficiency of other specified B group vitamins: Secondary | ICD-10-CM

## 2022-09-30 LAB — CMP (CANCER CENTER ONLY)
ALT: 12 U/L (ref 0–44)
AST: 18 U/L (ref 15–41)
Albumin: 4.4 g/dL (ref 3.5–5.0)
Alkaline Phosphatase: 81 U/L (ref 38–126)
Anion gap: 6 (ref 5–15)
BUN: 16 mg/dL (ref 8–23)
CO2: 27 mmol/L (ref 22–32)
Calcium: 9.6 mg/dL (ref 8.9–10.3)
Chloride: 107 mmol/L (ref 98–111)
Creatinine: 1 mg/dL (ref 0.44–1.00)
GFR, Estimated: 58 mL/min — ABNORMAL LOW (ref >60)
Glucose, Bld: 88 mg/dL (ref 70–99)
Potassium: 4.2 mmol/L (ref 3.5–5.1)
Sodium: 140 mmol/L (ref 135–145)
Total Bilirubin: 0.5 mg/dL (ref 0.3–1.2)
Total Protein: 6.5 g/dL (ref 6.5–8.1)

## 2022-09-30 LAB — CBC WITH DIFFERENTIAL (CANCER CENTER ONLY)
Abs Immature Granulocytes: 0.03 10*3/uL (ref 0.00–0.07)
Basophils Absolute: 0.1 10*3/uL (ref 0.0–0.1)
Basophils Relative: 1 %
Eosinophils Absolute: 0.1 10*3/uL (ref 0.0–0.5)
Eosinophils Relative: 1 %
HCT: 42.3 % (ref 36.0–46.0)
Hemoglobin: 14.8 g/dL (ref 12.0–15.0)
Immature Granulocytes: 1 %
Lymphocytes Relative: 24 %
Lymphs Abs: 1.4 10*3/uL (ref 0.7–4.0)
MCH: 40.8 pg — ABNORMAL HIGH (ref 26.0–34.0)
MCHC: 35 g/dL (ref 30.0–36.0)
MCV: 116.5 fL — ABNORMAL HIGH (ref 80.0–100.0)
Monocytes Absolute: 0.6 10*3/uL (ref 0.1–1.0)
Monocytes Relative: 10 %
Neutro Abs: 3.8 10*3/uL (ref 1.7–7.7)
Neutrophils Relative %: 63 %
Platelet Count: 602 10*3/uL — ABNORMAL HIGH (ref 150–400)
RBC: 3.63 MIL/uL — ABNORMAL LOW (ref 3.87–5.11)
RDW: 14.2 % (ref 11.5–15.5)
WBC Count: 6 10*3/uL (ref 4.0–10.5)
nRBC: 0 % (ref 0.0–0.2)

## 2022-09-30 LAB — VITAMIN B12: Vitamin B-12: 463 pg/mL (ref 180–914)

## 2022-09-30 NOTE — Progress Notes (Signed)
Hematology and Oncology Follow Up Visit  Anna Fuller [id number] May 21, 1946 77 y.o. 09/30/2022 7:54 AM      Principle Diagnosis: 77 year old woman with JAK2 positive myeloproliferative disorder diagnosed in 2007.  She presented with essential thrombocythemia.  This was confirmed by bone marrow biopsy in 2008.   Secondary diagnosis: Polymyalgia rheumatica diagnosed in November 2022.  Prior Therapy:   Anagrelide initiated briefly and discontinued due to do intolerance 2008.  High-dose prednisone due to polymyalgia rheumatica started in December 2022.  She was tapered off to 1 mg daily and was discontinued in May 2023.  Current therapy:   Hydroxyurea started in 2009.  She is currently on 1500 mg daily starting on 08/06/2016.    Interim History: Anna Fuller presents today for a follow-up.  Since last visit, she reports feeling well without any major complaints.  She did develop left knee pain and currently under evaluation by orthopedics.  She had an injection done in November and fluid drained subsequently.  She is able to ambulate without any major difficulties.  She has reported some mild myalgias and her shoulders and neck which is chronic in nature.  She tolerates hydroxyurea without any new complaints.       Medications: Reviewed without changes. Current Outpatient Prescriptions  Medication Sig Dispense Refill   aspirin 81 MG tablet Take 81 mg by mouth daily.         cholecalciferol (VITAMIN D) 1000 UNITS tablet Take 2,000 Units by mouth daily.       estrogens, conjugated, (PREMARIN) 0.3 MG tablet Take 1 tablet (0.3 mg total) by mouth daily. Take daily for 21 days then do not take for 7 days.  30 tablet  11   hydroxyurea (HYDREA) 500 MG capsule Take one tab twice a day.  Every other night take an additional tablet at night.  (Alternate 1069m and 15051m.  120 capsule  3   simvastatin (ZOCOR) 40 MG tablet Take 1 tablet (40 mg total) by mouth at bedtime.  30 tablet  11          Allergies: Reviewed with the patient again today. Allergies  Allergen Reactions   Codeine Nausea And Vomiting   Iodine Anaphylaxis    Contrast dye   Penicillins Other (See Comments)     Physical Exam:         Blood pressure (!) 129/50, pulse 78, temperature 97.8 F (36.6 C), temperature source Temporal, resp. rate 16, height _0  (1.575 m), weight 124 lb 1.6 oz (56.3 kg), SpO2 97 %.          ECOG: 1   General appearance: Alert, awake without any distress. Head: Atraumatic without abnormalities Oropharynx: Without any thrush or ulcers. Eyes: No scleral icterus. Lymph nodes: No lymphadenopathy noted in the cervical, supraclavicular, or axillary nodes Heart:regular rate and rhythm, without any murmurs or gallops.   Lung: Clear to auscultation without any rhonchi, wheezes or dullness to percussion. Abdomin: Soft, nontender without any shifting dullness or ascites. Musculoskeletal: No clubbing or cyanosis. Neurological: No motor or sensory deficits. Skin: No rashes or lesions.                        Lab Results: Lab Results  Component Value Date   WBC 6.8 07/30/2022   HGB 14.9 07/30/2022   HCT 43.3 07/30/2022   MCV 114.9 (H) 07/30/2022   PLT 587 (H) 07/30/2022     Chemistry      Component Value Date/Time  NA 138 07/30/2022 0749   NA 140 09/28/2017 0811   K 4.3 07/30/2022 0749   K 3.8 09/28/2017 0811   CL 105 07/30/2022 0749   CL 105 01/30/2013 0825   CO2 26 07/30/2022 0749   CO2 25 09/28/2017 0811   BUN 20 07/30/2022 0749   BUN 14.6 09/28/2017 0811   CREATININE 0.93 07/30/2022 0749   CREATININE 1.15 (H) 11/18/2020 0911   CREATININE 0.9 09/28/2017 0811      Component Value Date/Time   CALCIUM 9.4 07/30/2022 0749   CALCIUM 9.1 09/28/2017 0811   ALKPHOS 94 07/30/2022 0749   ALKPHOS 75 09/28/2017 0811   AST 16 07/30/2022 0749   AST 23 09/28/2017 0811   ALT 10 07/30/2022 0749   ALT 23 09/28/2017 0811   BILITOT 0.6  07/30/2022 0749   BILITOT 0.46 09/28/2017 0811            Impression and Plan:  77 year old woman with:  1.  Myeloproliferative disorder diagnosed in 2007.  She was found to have JAK2 positive mutation, elevated platelet count confirmed by bone marrow biopsy.  She is currently on hydroxyurea with reasonable control of her platelet count.  Her hemoglobin and white cell count remains normal.  Risks and benefits of continuing hydroxyurea long-term complications were reiterated.  Bone marrow disease, GI toxicity as well as dermatological issue were reiterated.  Alternative therapy including Jakafi could be considered if she has worsening constitutional symptoms.  CBC reviewed today and showed a platelet count under adequate control of around 600.  I do not recommend increasing the dose of hydroxyurea due to potential side effects.     2.  Polymyalgia rheumatica: unclear if this is related to her myeloproliferative disorder.  She is off prednisone at this time without any recent reactivation.    3.  Thrombosis prophylaxis: Risk of thrombosis remains low at this time.  Her white cell count is normal and has not had any thrombosis episodes.  4.  Weight loss: We discussed strategies to improve her nutritional intake including nutritional supplements.    5.  Follow-up: In 2 months for a repeat follow-up.   30   minutes were spent on this encounter.  The time was dedicated to updating disease status, treatment choices and outlining future plan of care review.  Anna Button, MD 1/4/20247:54 AM`

## 2022-10-01 ENCOUNTER — Other Ambulatory Visit: Payer: Self-pay

## 2022-10-01 ENCOUNTER — Telehealth: Payer: Self-pay | Admitting: Orthopaedic Surgery

## 2022-10-01 DIAGNOSIS — G8929 Other chronic pain: Secondary | ICD-10-CM

## 2022-10-01 NOTE — Telephone Encounter (Signed)
Patient called in wondering if MRI order has been placed I cannot see anything in her chart please advise, advised patient it takes time for everything to go through with insurance possibly a week

## 2022-10-05 ENCOUNTER — Telehealth: Payer: Self-pay | Admitting: Orthopaedic Surgery

## 2022-10-05 NOTE — Telephone Encounter (Signed)
Patient states that Dr Ninfa Linden was supposed to send in an order for MRI please call to advise [phone]

## 2022-10-05 NOTE — Telephone Encounter (Signed)
Patient aware that the referral for MRI is in the chart and after getting approval from insurance we sent to [facility] and they will call and schedule her

## 2022-10-09 ENCOUNTER — Ambulatory Visit
Admission: RE | Admit: 2022-10-09 | Discharge: 2022-10-09 | Disposition: A | Discharge status: Home / Self Care | Payer: Medicare Other | Source: Ambulatory Visit | Attending: Orthopaedic Surgery | Admitting: Orthopaedic Surgery

## 2022-10-09 DIAGNOSIS — S83242A Other tear of medial meniscus, current injury, left knee, initial encounter: Secondary | ICD-10-CM | POA: Diagnosis not present

## 2022-10-09 DIAGNOSIS — M1712 Unilateral primary osteoarthritis, left knee: Secondary | ICD-10-CM | POA: Diagnosis not present

## 2022-10-09 DIAGNOSIS — G8929 Other chronic pain: Secondary | ICD-10-CM

## 2022-10-09 DIAGNOSIS — M25462 Effusion, left knee: Secondary | ICD-10-CM | POA: Diagnosis not present

## 2022-10-18 ENCOUNTER — Encounter: Payer: Self-pay | Admitting: Physician Assistant

## 2022-10-18 ENCOUNTER — Ambulatory Visit (INDEPENDENT_AMBULATORY_CARE_PROVIDER_SITE_OTHER): Payer: Medicare Other | Admitting: Physician Assistant

## 2022-10-18 DIAGNOSIS — M1712 Unilateral primary osteoarthritis, left knee: Secondary | ICD-10-CM | POA: Diagnosis not present

## 2022-10-18 MED ORDER — TRAMADOL HCL 50 MG PO TABS: 50.0000 mg | ORAL_TABLET | Freq: Four times a day (QID) | ORAL | 0 refills | Status: DC | PRN

## 2022-10-18 NOTE — Progress Notes (Signed)
HPI: [name] comes in today to go over the MRI of her left knee.  She states she still has pain in the knee tickly at night.  She has occasional sensation of the knee getting stuck.  However her knee does not catch or give way.  And she had no knee pain prior to approximately 4 months ago.  No known injury.  MRI images were reviewed with patient.  MRI shows large Baker's cyst containing debris and loose bodies.  Tricompartmental degenerative changes with moderate changes involving the medial compartment.  Lateral compartment with moderate to advanced changes and areas of near full-thickness cartilage loss and joint space narrowing.  Patellofemoral joint areas of full-thickness cartilage loss.  Oblique tear involving posterior horn and mid body junction of the medial meniscus.  Anterior horn and mid body tear with lateral displacement of the lateral meniscus.  Impression: Left knee tricompartmental arthritis moderate to severe. Left knee medial lateral meniscal tears  Plan: After reviewing the results with the patient discussed that me knee arthroscopy with partial meniscectomies would not benefit her given the amount of arthritis she has.  She is not interested in the surgery at this point in time.  She is having no true mechanical symptoms.  Therefore she is going to just monitor overnight this point in time.  She did ask for tramadol that she can take during periods of extreme pain.  She will follow-up with [place] on an as-needed basis.  Questions were encouraged and answered

## 2022-10-22 ENCOUNTER — Telehealth: Payer: Self-pay | Admitting: Hematology

## 2022-10-22 NOTE — Telephone Encounter (Signed)
Called patient per 1/25 in basket. Patient r/s and notified.

## 2022-11-18 NOTE — Progress Notes (Addendum)
Annual Wellness Visit    Patient Care Team: Kambria Grima, Cresenciano Lick, MD as PCP - General (Internal Medicine) Alda Berthold, DO as Consulting Physician (Neurology)  Visit Date: 11/25/22   Chief Complaint  Patient presents with   Medicare Wellness    Subjective:   Patient: Anna Fuller, Female    DOB: 18-Feb-1946, 77 y.o.   MRN: [id number]  Anna Fuller is a 77 y.o. Female who presents today for her Annual Wellness Visit. She has a history of cancer, hyperlipidemia, macular degeneration, neuromuscular disorder, thrombocytosis.  History of essential thrombocytosis JAK2 myeloproliferative disorder diagnosed in 2007 and treated with Hydrea 1500 mg daily. Dr. Alen Blew ordered bone marrow biopsy and CT scan on 09/14/21 and PET scan on 08/24/21 to determine if cancer had worsened, it had not. She has a new oncologist, Dr. Sullivan Lone. Platelet count elevated at 602 one month ago.   History of arthritis treated with Ultram 50 mg every 6 hours as needed. History of bilateral shoulder and knee pain.  History of mixed hyperlipidemia. No longer taking a statin as of 11/22 due to myalgias experienced while taking it. Prescribed prednisone in 3/23 with some relief. Interested in trying low dose statin. CHOL elevated at 244, LDL at 138, TRIG at 162 on 11/23/22.  History of Vitamin D deficiency treated with Vitamin D 2,000 units daily.  TSH at 2.76 on 11/23/22, down from 3.06 on 11/20/21.  Denies swelling in legs.  Status post abdominal hysterectomy.   Mammogram last completed 01/22/22. No mammographic evidence of malignancy. Recommended repeat in 2024.  Colonoscopy last completed 05/03/18. Results showed one 3 mm polyp in ascending colon, removed, one 4 mm polyp in sigmoid colon, removed, diverticulosis in left colon. Pathology showed that at least one polyp was adenomatous. Examination otherwise normal.  Recommended repeat in 2024.   Past Medical History:  Diagnosis Date   Cancer [facility])     essential thrombocythemia   Hyperlipidemia    Macular degeneration    Neuromuscular disorder (HCC)    chemo induced neuropathy   Thrombocytosis      Family History  Problem Relation Age of Onset   Pulmonary fibrosis Mother    Colon polyps Mother    Diabetes Father    Esophageal cancer Maternal Uncle    Breast cancer Sister    Colon cancer Neg Hx    Stomach cancer Neg Hx    Rectal cancer Neg Hx      Social History   Social History Narrative   Right handed    Lives alone     Review of Systems  Constitutional:  Negative for chills, fever, malaise/fatigue and weight loss.  HENT:  Negative for hearing loss, sinus pain and sore throat.   Respiratory:  Negative for cough and hemoptysis.   Cardiovascular:  Negative for chest pain, palpitations, leg swelling and PND.  Gastrointestinal:  Negative for abdominal pain, constipation, diarrhea, heartburn, nausea and vomiting.  Genitourinary:  Negative for dysuria, frequency and urgency.  Musculoskeletal:  Negative for back pain, myalgias and neck pain.  Skin:  Negative for itching and rash.  Neurological:  Negative for dizziness, tingling, seizures and headaches.  Endo/Heme/Allergies:  Negative for polydipsia.  Psychiatric/Behavioral:  Negative for depression. The patient is not nervous/anxious.       Objective:   Vitals: BP 120/74   Pulse 80   Temp 98.5 F (36.9 C) (Tympanic)   Ht '5\' 2"'$  (1.575 m)   Wt 126 lb (57.2 kg)  SpO2 99%   BMI 23.05 kg/m   Physical Exam Vitals and nursing note reviewed.  Constitutional:      General: She is not in acute distress.    Appearance: Normal appearance. She is not ill-appearing or toxic-appearing.  HENT:     Head: Normocephalic and atraumatic.     Right Ear: Hearing, tympanic membrane and external ear normal. There is impacted cerumen.     Left Ear: Hearing, tympanic membrane, ear canal and external ear normal.     Ears:     Comments: Cerumen in right external ear canal.     Mouth/Throat:     Pharynx: Oropharynx is clear.  Eyes:     Extraocular Movements: Extraocular movements intact.     Pupils: Pupils are equal, round, and reactive to light.  Neck:     Thyroid: No thyroid mass, thyromegaly or thyroid tenderness.     Vascular: No carotid bruit.  Cardiovascular:     Rate and Rhythm: Normal rate and regular rhythm. No extrasystoles are present.    Pulses: Normal pulses.          Dorsalis pedis pulses are 2+ on the right side and 2+ on the left side.       Posterior tibial pulses are 2+ on the right side and 2+ on the left side.     Heart sounds: Normal heart sounds. No murmur heard.    No friction rub. No gallop.  Pulmonary:     Effort: Pulmonary effort is normal.     Breath sounds: Normal breath sounds. No decreased breath sounds, wheezing, rhonchi or rales.  Chest:     Chest wall: No mass.  Abdominal:     Palpations: Abdomen is soft. There is no hepatomegaly, splenomegaly or mass.     Tenderness: There is no abdominal tenderness.     Hernia: No hernia is present.  Musculoskeletal:     Cervical back: Normal range of motion.     Right lower leg: No edema.     Left lower leg: No edema.  Lymphadenopathy:     Cervical: No cervical adenopathy.     Upper Body:     Right upper body: No supraclavicular adenopathy.     Left upper body: No supraclavicular adenopathy.  Skin:    General: Skin is warm and dry.  Neurological:     General: No focal deficit present.     Mental Status: She is alert and oriented to person, place, and time. Mental status is at baseline.     Sensory: Sensation is intact.     Motor: Motor function is intact. No weakness.     Deep Tendon Reflexes: Reflexes are normal and symmetric.  Psychiatric:        Attention and Perception: Attention normal.        Mood and Affect: Mood normal.        Speech: Speech normal.        Behavior: Behavior normal.        Thought Content: Thought content normal.        Cognition and Memory:  Cognition normal.        Judgment: Judgment normal.     Most recent functional status assessment:    11/25/2022    9:46 AM  In your present state of health, do you have any difficulty performing the following activities:  Hearing? 0  Vision? 0  Difficulty concentrating or making decisions? 0  Walking or climbing stairs? 0  Dressing or bathing?  0  Doing errands, shopping? 0  Preparing Food and eating ? N  Using the Toilet? N  In the past six months, have you accidently leaked urine? N  Do you have problems with loss of bowel control? N  Managing your Medications? N  Managing your Finances? N  Housekeeping or managing your Housekeeping? N   Most recent fall risk assessment:    11/25/2022    9:45 AM  Fall Risk   Falls in the past year? 0  Number falls in past yr: 0  Injury with Fall? 0  Risk for fall due to : No Fall Risks  Follow up Falls prevention discussed    Most recent depression screenings:    11/25/2022    9:46 AM 11/23/2021    9:57 AM  PHQ 2/9 Scores  PHQ - 2 Score 0 0   Most recent cognitive screening:    11/25/2022    9:47 AM  6CIT Screen  What Year? 0 points  What month? 0 points  What time? 0 points  Count back from 20 0 points  Months in reverse 0 points  Repeat phrase 0 points  Total Score 0 points     Results:   Studies obtained and personally reviewed by me:  Mammogram last completed 01/22/22. No mammographic evidence of malignancy. Recommended repeat in 2024.  Colonoscopy last completed 05/03/18. Results showed one 3 mm polyp in ascending colon, removed, one 4 mm polyp in sigmoid colon, removed, diverticulosis in left colon. Pathology showed that at least one polyp was adenomatous. Examination otherwise normal.  Recommended repeat in 2024.  Labs:       Component Value Date/Time   NA 140 09/30/2022 0800   NA 140 09/28/2017 0811   K 4.2 09/30/2022 0800   K 3.8 09/28/2017 0811   CL 107 09/30/2022 0800   CL 105 01/30/2013 0825   CO2 27  09/30/2022 0800   CO2 25 09/28/2017 0811   GLUCOSE 88 09/30/2022 0800   GLUCOSE 88 09/28/2017 0811   GLUCOSE 88 01/30/2013 0825   BUN 16 09/30/2022 0800   BUN 14.6 09/28/2017 0811   CREATININE 1.00 09/30/2022 0800   CREATININE 1.15 (H) 11/18/2020 0911   CREATININE 0.9 09/28/2017 0811   CALCIUM 9.6 09/30/2022 0800   CALCIUM 9.1 09/28/2017 0811   PROT 6.5 09/30/2022 0800   PROT 7.0 09/28/2017 0811   ALBUMIN 4.4 09/30/2022 0800   ALBUMIN 4.2 09/28/2017 0811   AST 18 09/30/2022 0800   AST 23 09/28/2017 0811   ALT 12 09/30/2022 0800   ALT 23 09/28/2017 0811   ALKPHOS 81 09/30/2022 0800   ALKPHOS 75 09/28/2017 0811   BILITOT 0.5 09/30/2022 0800   BILITOT 0.46 09/28/2017 0811   GFRNONAA 58 (L) 09/30/2022 0800   GFRNONAA 46 (L) 11/18/2020 0911   GFRAA 54 (L) 11/18/2020 0911     Lab Results  Component Value Date   WBC 6.0 09/30/2022   HGB 14.8 09/30/2022   HCT 42.3 09/30/2022   MCV 116.5 (H) 09/30/2022   PLT 602 (H) 09/30/2022    Lab Results  Component Value Date   CHOL 244 (H) 11/23/2022   HDL 76 11/23/2022   LDLCALC 138 (H) 11/23/2022   TRIG 162 (H) 11/23/2022   CHOLHDL 3.2 11/23/2022    No results found for: "HGBA1C"   Lab Results  Component Value Date   TSH 2.76 11/23/2022    Assessment & Plan:   Essential thrombocytosis JAK2 myeloproliferative disorder diagnosed in 2007: treated  with Hydrea 1500 mg daily. Dr. Alen Blew ordered bone marrow biopsy and CT scan on 09/14/21 and PET scan on 08/24/21 to determine if cancer had worsened, it had not. She has a new oncologist, Dr. Sullivan Lone. Platelet count elevated at 602 one month ago.    Arthritis: treated with Ultram 50 mg every 6 hours as needed. History of bilateral shoulder and knee pain.  Mixed hyperlipidemia: Stopped her statin in 11/22 due to myalgias experienced while taking it. CHOL elevated at 244, LDL at 138, TRIG at 162 on 11/23/22. Prescribed Crestor 5 mg daily.  History of Vitamin D deficiency: treated  with Vitamin D 2,000 units daily.  Mammogram: last completed 01/22/22. No mammographic evidence of malignancy. Recommended repeat in 2024. She will be scheduling a mammogram for April.  Colonoscopy: last completed 05/03/18. Results showed one 3 mm polyp in ascending colon, removed, one 4 mm polyp in sigmoid colon, removed, diverticulosis in left colon. Pathology showed that at least one polyp was adenomatous. Examination otherwise normal.  Recommended repeat in 2024.  Vaccine Counseling: Tetanus immunization discussed. Discussed shingles, RSV vaccines. UTD on flu, Covid-19, pneumonia vaccines.     Annual wellness visit done today including the all of the following: Reviewed patient's Family Medical History Reviewed and updated list of patient's medical providers Assessment of cognitive impairment was done Assessed patient's functional ability Established a written schedule for health screening [place] Completed and Reviewed  Discussed health benefits of physical activity, and encouraged her to engage in regular exercise appropriate for her age and condition.        I,Alexander Ruley,acting as a Education administrator for Elby Showers, MD.,have documented all relevant documentation on the behalf of Elby Showers, MD,as directed by  Elby Showers, MD while in the presence of Elby Showers, MD.   I, Elby Showers, MD, have reviewed all documentation for this visit. The documentation on 11/25/22 for the exam, diagnosis, procedures, and orders are all accurate and complete.

## 2022-11-23 ENCOUNTER — Other Ambulatory Visit: Payer: Medicare Other

## 2022-11-23 DIAGNOSIS — E7849 Other hyperlipidemia: Secondary | ICD-10-CM

## 2022-11-23 DIAGNOSIS — R5383 Other fatigue: Secondary | ICD-10-CM

## 2022-11-23 DIAGNOSIS — R700 Elevated erythrocyte sedimentation rate: Secondary | ICD-10-CM | POA: Diagnosis not present

## 2022-11-23 DIAGNOSIS — R030 Elevated blood-pressure reading, without diagnosis of hypertension: Secondary | ICD-10-CM

## 2022-11-23 NOTE — Addendum Note (Signed)
Addended by: Geradine Girt D on: 11/23/2022 10:14 AM   Modules accepted: Orders

## 2022-11-24 LAB — LIPID PANEL
Cholesterol: 244 mg/dL — ABNORMAL HIGH (ref <200)
HDL: 76 mg/dL (ref >=50)
LDL Cholesterol (Calc): 138 mg/dL (calc) — ABNORMAL HIGH
Non-HDL Cholesterol (Calc): 168 mg/dL (calc) — ABNORMAL HIGH (ref <130)
Total CHOL/HDL Ratio: 3.2 (calc) (ref <5.0)
Triglycerides: 162 mg/dL — ABNORMAL HIGH (ref <150)

## 2022-11-24 LAB — TSH: TSH: 2.76 mIU/L (ref 0.40–4.50)

## 2022-11-25 ENCOUNTER — Ambulatory Visit (INDEPENDENT_AMBULATORY_CARE_PROVIDER_SITE_OTHER): Payer: Medicare Other | Admitting: Internal Medicine

## 2022-11-25 ENCOUNTER — Encounter: Payer: Self-pay | Admitting: Internal Medicine

## 2022-11-25 VITALS — BP 120/74 | HR 80 | Temp 98.5°F | Ht 62.0 in | Wt 126.0 lb

## 2022-11-25 DIAGNOSIS — Z1589 Genetic susceptibility to other disease: Secondary | ICD-10-CM

## 2022-11-25 DIAGNOSIS — Z9079 Acquired absence of other genital organ(s): Secondary | ICD-10-CM | POA: Diagnosis not present

## 2022-11-25 DIAGNOSIS — Z0000 Encounter for general adult medical examination without abnormal findings: Secondary | ICD-10-CM

## 2022-11-25 DIAGNOSIS — Z90722 Acquired absence of ovaries, bilateral: Secondary | ICD-10-CM

## 2022-11-25 DIAGNOSIS — E782 Mixed hyperlipidemia: Secondary | ICD-10-CM | POA: Diagnosis not present

## 2022-11-25 DIAGNOSIS — D473 Essential (hemorrhagic) thrombocythemia: Secondary | ICD-10-CM

## 2022-11-25 DIAGNOSIS — Z90710 Acquired absence of both cervix and uterus: Secondary | ICD-10-CM

## 2022-11-25 LAB — POCT URINALYSIS DIPSTICK
Bilirubin, UA: NEGATIVE
Blood, UA: NEGATIVE
Glucose, UA: NEGATIVE
Ketones, UA: NEGATIVE
Leukocytes, UA: NEGATIVE
Nitrite, UA: NEGATIVE
Protein, UA: NEGATIVE
Spec Grav, UA: 1.01 (ref 1.010–1.025)
Urobilinogen, UA: 0.2 E.U./dL
pH, UA: 5 (ref 5.0–8.0)

## 2022-11-25 MED ORDER — ROSUVASTATIN CALCIUM 5 MG PO TABS: 5.0000 mg | ORAL_TABLET | Freq: Every day | ORAL | 1 refills | Status: DC

## 2022-11-25 NOTE — Patient Instructions (Addendum)
Continue close Oncology follow up. Try low dose Crestor 5 mg daily and have lipid and liver panels drawn in 6 months.It was a pleassure to see you today. Vaccines discussed. Colonoscopy is due August 2024

## 2022-11-26 ENCOUNTER — Other Ambulatory Visit: Payer: Self-pay

## 2022-11-26 DIAGNOSIS — D473 Essential (hemorrhagic) thrombocythemia: Secondary | ICD-10-CM

## 2022-11-29 ENCOUNTER — Inpatient Hospital Stay (HOSPITAL_BASED_OUTPATIENT_CLINIC_OR_DEPARTMENT_OTHER): Payer: Medicare Other | Admitting: Hematology

## 2022-11-29 ENCOUNTER — Other Ambulatory Visit: Payer: Medicare Other

## 2022-11-29 ENCOUNTER — Ambulatory Visit: Payer: Medicare Other | Admitting: Hematology

## 2022-11-29 ENCOUNTER — Inpatient Hospital Stay: Payer: Medicare Other | Attending: Oncology

## 2022-11-29 VITALS — BP 141/61 | HR 81 | Temp 97.3°F | Resp 18 | Wt 126.8 lb

## 2022-11-29 DIAGNOSIS — D473 Essential (hemorrhagic) thrombocythemia: Secondary | ICD-10-CM | POA: Insufficient documentation

## 2022-11-29 DIAGNOSIS — Z803 Family history of malignant neoplasm of breast: Secondary | ICD-10-CM | POA: Diagnosis not present

## 2022-11-29 DIAGNOSIS — Z825 Family history of asthma and other chronic lower respiratory diseases: Secondary | ICD-10-CM | POA: Insufficient documentation

## 2022-11-29 DIAGNOSIS — M791 Myalgia, unspecified site: Secondary | ICD-10-CM | POA: Diagnosis not present

## 2022-11-29 DIAGNOSIS — Z87891 Personal history of nicotine dependence: Secondary | ICD-10-CM | POA: Insufficient documentation

## 2022-11-29 DIAGNOSIS — M353 Polymyalgia rheumatica: Secondary | ICD-10-CM | POA: Diagnosis not present

## 2022-11-29 DIAGNOSIS — M25562 Pain in left knee: Secondary | ICD-10-CM | POA: Insufficient documentation

## 2022-11-29 DIAGNOSIS — Z833 Family history of diabetes mellitus: Secondary | ICD-10-CM | POA: Insufficient documentation

## 2022-11-29 DIAGNOSIS — Z808 Family history of malignant neoplasm of other organs or systems: Secondary | ICD-10-CM | POA: Insufficient documentation

## 2022-11-29 DIAGNOSIS — Z79899 Other long term (current) drug therapy: Secondary | ICD-10-CM | POA: Diagnosis not present

## 2022-11-29 DIAGNOSIS — Z83719 Family history of colon polyps, unspecified: Secondary | ICD-10-CM | POA: Insufficient documentation

## 2022-11-29 DIAGNOSIS — E785 Hyperlipidemia, unspecified: Secondary | ICD-10-CM | POA: Diagnosis not present

## 2022-11-29 DIAGNOSIS — M1990 Unspecified osteoarthritis, unspecified site: Secondary | ICD-10-CM | POA: Insufficient documentation

## 2022-11-29 LAB — CMP (CANCER CENTER ONLY)
ALT: 16 U/L (ref 0–44)
AST: 20 U/L (ref 15–41)
Albumin: 4.5 g/dL (ref 3.5–5.0)
Alkaline Phosphatase: 72 U/L (ref 38–126)
Anion gap: 6 (ref 5–15)
BUN: 20 mg/dL (ref 8–23)
CO2: 27 mmol/L (ref 22–32)
Calcium: 9.4 mg/dL (ref 8.9–10.3)
Chloride: 106 mmol/L (ref 98–111)
Creatinine: 1.03 mg/dL — ABNORMAL HIGH (ref 0.44–1.00)
GFR, Estimated: 56 mL/min — ABNORMAL LOW (ref >60)
Glucose, Bld: 95 mg/dL (ref 70–99)
Potassium: 4.2 mmol/L (ref 3.5–5.1)
Sodium: 139 mmol/L (ref 135–145)
Total Bilirubin: 0.4 mg/dL (ref 0.3–1.2)
Total Protein: 7 g/dL (ref 6.5–8.1)

## 2022-11-29 LAB — CBC WITH DIFFERENTIAL (CANCER CENTER ONLY)
Abs Immature Granulocytes: 0.03 10*3/uL (ref 0.00–0.07)
Basophils Absolute: 0.1 10*3/uL (ref 0.0–0.1)
Basophils Relative: 1 %
Eosinophils Absolute: 0.1 10*3/uL (ref 0.0–0.5)
Eosinophils Relative: 1 %
HCT: 43.9 % (ref 36.0–46.0)
Hemoglobin: 15.4 g/dL — ABNORMAL HIGH (ref 12.0–15.0)
Immature Granulocytes: 0 %
Lymphocytes Relative: 24 %
Lymphs Abs: 1.6 10*3/uL (ref 0.7–4.0)
MCH: 40.1 pg — ABNORMAL HIGH (ref 26.0–34.0)
MCHC: 35.1 g/dL (ref 30.0–36.0)
MCV: 114.3 fL — ABNORMAL HIGH (ref 80.0–100.0)
Monocytes Absolute: 0.6 10*3/uL (ref 0.1–1.0)
Monocytes Relative: 9 %
Neutro Abs: 4.3 10*3/uL (ref 1.7–7.7)
Neutrophils Relative %: 65 %
Platelet Count: 634 10*3/uL — ABNORMAL HIGH (ref 150–400)
RBC: 3.84 MIL/uL — ABNORMAL LOW (ref 3.87–5.11)
RDW: 13.5 % (ref 11.5–15.5)
WBC Count: 6.7 10*3/uL (ref 4.0–10.5)
nRBC: 0 % (ref 0.0–0.2)

## 2022-11-29 LAB — VITAMIN B12: Vitamin B-12: 893 pg/mL (ref 180–914)

## 2022-11-29 MED ORDER — HYDROXYUREA 500 MG PO CAPS: ORAL_CAPSULE | ORAL | 4 refills | Status: DC

## 2022-11-29 NOTE — Progress Notes (Signed)
HEMATOLOGY/ONCOLOGY CONSULTATION NOTE  Date of Service: 11/29/2022  Patient Care Team: Elby Showers, MD as PCP - General (Internal Medicine) Alda Berthold, DO as Consulting Physician (Neurology)  CHIEF COMPLAINTS/PURPOSE OF CONSULTATION:  Essential thrombocytosis   Prior Therapy:    Anagrelide initiated briefly and discontinued due to do intolerance 2008.   High-dose prednisone due to polymyalgia rheumatica started in December 2022.  She was tapered off to 1 mg daily and was discontinued in May 2023.  HISTORY OF PRESENTING ILLNESS:  Anna Fuller is a wonderful 77 y.o. female who is here for continued evaluation and management of Essential thrombocytosis. Patient has been transferred to [place] by Dr. Alen Blew. Patient was diagnosed with JAK-2 positive myeloproliferative disorder in 2007. Essential thrombocythemia was confirmed in 2008 by bone marrow biopsy. Patient was also diagnosed with polymyalgia rheumatica in November 2022.   Patient was last seen by Dr. Alen Blew on 09/30/2022 and she complained of left knee pain and mild myalgias. Patient's current treatment includes Hydroxyurea 1500 mg daily which was started in 2009.   Patient reports she has been doing well overall without any new medical concerns since her last visit with Dr. Alen Blew. Patient notes she was having severe bone pain last year and she had bone marrow biopsy, but denies bone density study. Patient was started on prednisone last year for around 6 months when she had severe bone pain.   She notes she has discontinued Simvastatin 40 mg last year after she had severe bone pain. She is now taking Rosuvastatin 5 mg. Patient notes her bone pain is still there with Rosuvastatin, but not as severe.   She does take multi-vitamin supplements and Vitamin-D 1,000 mg every other day. She denies taking Vitamin B-12 supplement.   She denies infection issues, fever, chills, night sweats, infection issues, abdominal pain, chest  pain, back pain, or leg swelling. She denies any past medical history of blood clots. Patient reports no injuries to her spleen.  During this visit, patient still complains of consistent bone pain, but not as severe as last year. She notes her pain is primarily near her neck and shoulder. She has been evaluated by rheumatologist. She takes tramadol for pain management.   Patient does have osteoarthritis which is getting evaluated and managed by her orthopedic physician. Her osteoarthritis was diagnosed due to left knee pain.   Patient has received her influenza vaccine, COVID-19 Booster, Pneumonia vaccine, and Shingles vaccine. She denies RSV vaccine.     MEDICAL HISTORY:  Past Medical History:  Diagnosis Date   Cancer [facility])    essential thrombocythemia   Hyperlipidemia    Macular degeneration    Neuromuscular disorder ([place])    chemo induced neuropathy   Thrombocytosis     SURGICAL HISTORY: Past Surgical History:  Procedure Laterality Date   ABDOMINAL HYSTERECTOMY     CHOLECYSTECTOMY     COLONOSCOPY     extra bones removed     on feet, knees and wrist   POLYPECTOMY     TONSILLECTOMY     TUMOR EXCISION     left leg    SOCIAL HISTORY: Social History   Socioeconomic History   Marital status: Single    Spouse name: Not on file   Number of children: Not on file   Years of education: Not on file   Highest education level: Not on file  Occupational History   Not on file  Tobacco Use   Smoking status: Former  Types: Cigarettes    Quit date: 08/18/2002    Years since quitting: 20.2   Smokeless tobacco: Never  Vaping Use   Vaping Use: Never used  Substance and Sexual Activity   Alcohol use: Yes    Alcohol/week: 0.0 standard drinks of alcohol    Comment: rare   Drug use: No   Sexual activity: Never  Other Topics Concern   Not on file  Social History Narrative   Right handed    Lives alone   Social Determinants of Health   Financial Resource Strain: Not on  file  Food Insecurity: Not on file  Transportation Needs: Not on file  Physical Activity: Not on file  Stress: Not on file  Social Connections: Not on file  Intimate Partner Violence: Not on file    FAMILY HISTORY: Family History  Problem Relation Age of Onset   Pulmonary fibrosis Mother    Colon polyps Mother    Diabetes Father    Esophageal cancer Maternal Uncle    Breast cancer Sister    Colon cancer Neg Hx    Stomach cancer Neg Hx    Rectal cancer Neg Hx     ALLERGIES:  is allergic to codeine, iodine, and penicillins.  MEDICATIONS:  Current Outpatient Medications  Medication Sig Dispense Refill   aspirin 81 MG tablet Take 81 mg by mouth daily.     cholecalciferol (VITAMIN D) 1000 UNITS tablet Take 2,000 Units by mouth daily.     hydroxyurea (HYDREA) 500 MG capsule TAKE 3 CAPSULES BY MOUTH EVERY DAY 270 capsule 4   Multiple Vitamins-Minerals (ICAPS AREDS 2 PO) Take by mouth.     rosuvastatin (CRESTOR) 5 MG tablet Take 1 tablet (5 mg total) by mouth daily. 90 tablet 1   simvastatin (ZOCOR) 40 MG tablet TAKE 1 TABLET BY MOUTH EVERYDAY AT BEDTIME (Patient not taking: Reported on 11/25/2022) 90 tablet 3   Current Facility-Administered Medications  Medication Dose Route Frequency Provider Last Rate Last Admin   0.9 %  sodium chloride infusion  500 mL Intravenous Once Armbruster, Carlota Raspberry, MD        REVIEW OF SYSTEMS:    10 Point review of Systems was done is negative except as noted above.  PHYSICAL EXAMINATION: ECOG PERFORMANCE STATUS: 1 - Symptomatic but completely ambulatory  . Vitals:   11/29/22 1038  BP: (!) 141/61  Pulse: 81  Resp: 18  Temp: (!) 97.3 F (36.3 C)  SpO2: 98%   There were no vitals filed for this visit. .There is no height or weight on file to calculate BMI.  GENERAL:alert, in no acute distress and comfortable SKIN: no acute rashes, no significant lesions EYES: conjunctiva are pink and non-injected, sclera anicteric OROPHARYNX: MMM, no  exudates, no oropharyngeal erythema or ulceration NECK: supple, no JVD LYMPH:  no palpable lymphadenopathy in the cervical, axillary or inguinal regions LUNGS: clear to auscultation b/l with normal respiratory effort HEART: regular rate & rhythm ABDOMEN:  normoactive bowel sounds , non tender, not distended. Extremity: no pedal edema PSYCH: alert & oriented x 3 with fluent speech NEURO: no focal motor/sensory deficits  LABORATORY DATA:  I have reviewed the data as listed  .cbc RADIOGRAPHIC STUDIES: I have personally reviewed the radiological images as listed and agreed with the findings in the report. No results found.  ASSESSMENT & PLAN:  77 year old woman with:  1. Myeloproliferative disorder diagnosed in 2007.  She was found to have JAK2 positive mutation, elevated platelet count confirmed by bone  marrow biopsy. She is currently on hydroxyurea with reasonable control of her platelet count.  2.  Polymyalgia rheumatica: unclear if this is related to her myeloproliferative disorder.  She is off prednisone at this time without any recent reactivation.   3.  Thrombosis prophylaxis: Risk of thrombosis remains low at this time.  Her white cell count is normal and has not had any thrombosis episodes.  PLAN: -Discussed lab results from today, 11/29/2022, with the patient. CBC shows slightly elevated platelet count of 634. CMP is stable.  -Educated the patient on JAK-2 mutation.  -Educated the patient on osteoporosis.  -Discussed the option of bone density study due to bone pain. She denies bone density study as of right now.   -Patient has been tolerating Hydroxyurea 1500 mg well without any severe toxicities.  -Patient can continue Hydroxyurea 1500 mg.  -Recommended RSV vaccine.  -We will check vitamin-D levels and iron levels during our next visit in 2 months.  -Answered all of patient's questions.  Continue ASA '81mg'$  po daily  FOLLOW-UP: RTC with Dr Irene Limbo with labs in 2  months . Marland KitchenThe total time spent in the appointment was 32 minutes* .  All of the patient's questions were answered with apparent satisfaction. The patient knows to call the clinic with any problems, questions or concerns.   Sullivan Lone MD MS AAHIVMS [facility] Hematology/Oncology Physician [facility]  .*Total Encounter Time as defined by the Centers for Medicare and Medicaid Services includes, in addition to the face-to-face time of a patient visit (documented in the note above) non-face-to-face time: obtaining and reviewing outside history, ordering and reviewing medications, tests or procedures, care coordination (communications with other health care professionals or caregivers) and documentation in the medical record.  I, Cleda Mccreedy, am acting as a Education administrator for Sullivan Lone, MD.  .I have reviewed the above documentation for accuracy and completeness, and I agree with the above. Brunetta Genera MD

## 2022-12-02 ENCOUNTER — Encounter: Payer: Self-pay | Admitting: Radiology

## 2022-12-06 ENCOUNTER — Encounter: Payer: Self-pay | Admitting: Internal Medicine

## 2022-12-15 ENCOUNTER — Ambulatory Visit: Payer: Self-pay

## 2022-12-15 NOTE — Patient Outreach (Signed)
  Care Coordination   Initial Visit Note   12/15/2022 Name: LE INGS MRN: [id number] DOB: [birth year]  NISHAT COMPRES is a 77 y.o. year old female who sees Baxley, Cresenciano Lick, MD for primary care. I spoke with  Shauna Hugh by phone today.  What matters to the patients health and wellness today?  No concerns, doing well at this time.    Goals Addressed             This Visit's Progress    COMPLETED: Care Coordination Activities       Care Coordination Interventions: SDoH screening performed - no acute resource challenges identified at this time Determined the patient does not have concerns with medication costs and/or adherence Education provided on the role of the care coordination team - patient declines ongoing follow up at this time Instructed the patient to contact her primary care provider as needed         SDOH assessments and interventions completed:  Yes  SDOH Interventions Today    Flowsheet Row Most Recent Value  SDOH Interventions   Food Insecurity Interventions Intervention Not Indicated  Housing Interventions Intervention Not Indicated  Transportation Interventions Intervention Not Indicated  Utilities Interventions Intervention Not Indicated  Financial Strain Interventions Intervention Not Indicated        Care Coordination Interventions:  Yes, provided   Interventions Today    Flowsheet Row Most Recent Value  Chronic Disease   Chronic disease during today's visit Other  General Interventions   General Interventions Discussed/Reviewed General Interventions Discussed  Education Interventions   Education Provided Provided Education  Provided Verbal Education On Other  [Care Coordination Program]        Follow up plan: No further intervention required.   Encounter Outcome:  Pt. Visit Completed   Daneen Schick, BSW, CDP Social Worker, Certified Dementia Practitioner [facility]  Care Coordination [phone]

## 2022-12-15 NOTE — Patient Instructions (Signed)
Visit Information  Thank you for taking time to visit with me today. Please don't hesitate to contact me if I can be of assistance to you.   Following are the goals we discussed today:   Goals Addressed             This Visit's Progress    COMPLETED: Care Coordination Activities       Care Coordination Interventions: SDoH screening performed - no acute resource challenges identified at this time Determined the patient does not have concerns with medication costs and/or adherence Education provided on the role of the care coordination team - patient declines ongoing follow up at this time Instructed the patient to contact her primary care provider as needed         If you are experiencing a Mental Health or [place] or need someone to talk to, please call 911  Patient verbalizes understanding of instructions and care plan provided today and agrees to view in [place]. Active MyChart status and patient understanding of how to access instructions and care plan via MyChart confirmed with patient.     No further follow up required: Please contact your primary care provider as needed.  Daneen Schick, BSW, CDP Social Worker, Certified Dementia Practitioner [facility]  Care Coordination [phone]

## 2022-12-20 ENCOUNTER — Other Ambulatory Visit: Payer: Self-pay | Admitting: Internal Medicine

## 2022-12-20 DIAGNOSIS — Z1231 Encounter for screening mammogram for malignant neoplasm of breast: Secondary | ICD-10-CM

## 2023-01-28 ENCOUNTER — Other Ambulatory Visit: Payer: Self-pay

## 2023-01-28 DIAGNOSIS — D473 Essential (hemorrhagic) thrombocythemia: Secondary | ICD-10-CM

## 2023-01-31 ENCOUNTER — Inpatient Hospital Stay (HOSPITAL_BASED_OUTPATIENT_CLINIC_OR_DEPARTMENT_OTHER): Payer: Medicare Other | Admitting: Hematology

## 2023-01-31 ENCOUNTER — Inpatient Hospital Stay: Payer: Medicare Other | Attending: Oncology

## 2023-01-31 VITALS — BP 128/56 | HR 82 | Temp 97.3°F | Resp 16 | Wt 127.6 lb

## 2023-01-31 DIAGNOSIS — R202 Paresthesia of skin: Secondary | ICD-10-CM | POA: Insufficient documentation

## 2023-01-31 DIAGNOSIS — M791 Myalgia, unspecified site: Secondary | ICD-10-CM | POA: Insufficient documentation

## 2023-01-31 DIAGNOSIS — Z885 Allergy status to narcotic agent status: Secondary | ICD-10-CM | POA: Diagnosis not present

## 2023-01-31 DIAGNOSIS — D471 Chronic myeloproliferative disease: Secondary | ICD-10-CM | POA: Insufficient documentation

## 2023-01-31 DIAGNOSIS — Z9049 Acquired absence of other specified parts of digestive tract: Secondary | ICD-10-CM | POA: Insufficient documentation

## 2023-01-31 DIAGNOSIS — D473 Essential (hemorrhagic) thrombocythemia: Secondary | ICD-10-CM | POA: Diagnosis not present

## 2023-01-31 DIAGNOSIS — Z83719 Family history of colon polyps, unspecified: Secondary | ICD-10-CM | POA: Diagnosis not present

## 2023-01-31 DIAGNOSIS — Z87891 Personal history of nicotine dependence: Secondary | ICD-10-CM | POA: Diagnosis not present

## 2023-01-31 DIAGNOSIS — M1712 Unilateral primary osteoarthritis, left knee: Secondary | ICD-10-CM | POA: Diagnosis not present

## 2023-01-31 DIAGNOSIS — Z880 Allergy status to penicillin: Secondary | ICD-10-CM | POA: Diagnosis not present

## 2023-01-31 DIAGNOSIS — Z825 Family history of asthma and other chronic lower respiratory diseases: Secondary | ICD-10-CM | POA: Diagnosis not present

## 2023-01-31 DIAGNOSIS — Z833 Family history of diabetes mellitus: Secondary | ICD-10-CM | POA: Insufficient documentation

## 2023-01-31 DIAGNOSIS — Z888 Allergy status to other drugs, medicaments and biological substances status: Secondary | ICD-10-CM | POA: Diagnosis not present

## 2023-01-31 DIAGNOSIS — M353 Polymyalgia rheumatica: Secondary | ICD-10-CM | POA: Insufficient documentation

## 2023-01-31 DIAGNOSIS — M898X9 Other specified disorders of bone, unspecified site: Secondary | ICD-10-CM | POA: Insufficient documentation

## 2023-01-31 DIAGNOSIS — Z803 Family history of malignant neoplasm of breast: Secondary | ICD-10-CM | POA: Insufficient documentation

## 2023-01-31 DIAGNOSIS — Z800 Family history of malignant neoplasm of digestive organs: Secondary | ICD-10-CM | POA: Insufficient documentation

## 2023-01-31 DIAGNOSIS — Z79899 Other long term (current) drug therapy: Secondary | ICD-10-CM | POA: Insufficient documentation

## 2023-01-31 DIAGNOSIS — Z90710 Acquired absence of both cervix and uterus: Secondary | ICD-10-CM | POA: Insufficient documentation

## 2023-01-31 DIAGNOSIS — M542 Cervicalgia: Secondary | ICD-10-CM | POA: Insufficient documentation

## 2023-01-31 DIAGNOSIS — E785 Hyperlipidemia, unspecified: Secondary | ICD-10-CM | POA: Diagnosis not present

## 2023-01-31 LAB — CBC WITH DIFFERENTIAL (CANCER CENTER ONLY)
Abs Immature Granulocytes: 0.04 10*3/uL (ref 0.00–0.07)
Basophils Absolute: 0.1 10*3/uL (ref 0.0–0.1)
Basophils Relative: 1 %
Eosinophils Absolute: 0.1 10*3/uL (ref 0.0–0.5)
Eosinophils Relative: 1 %
HCT: 47.6 % — ABNORMAL HIGH (ref 36.0–46.0)
Hemoglobin: 16.3 g/dL — ABNORMAL HIGH (ref 12.0–15.0)
Immature Granulocytes: 1 %
Lymphocytes Relative: 20 %
Lymphs Abs: 1.5 10*3/uL (ref 0.7–4.0)
MCH: 38.5 pg — ABNORMAL HIGH (ref 26.0–34.0)
MCHC: 34.2 g/dL (ref 30.0–36.0)
MCV: 112.5 fL — ABNORMAL HIGH (ref 80.0–100.0)
Monocytes Absolute: 0.9 10*3/uL (ref 0.1–1.0)
Monocytes Relative: 12 %
Neutro Abs: 4.9 10*3/uL (ref 1.7–7.7)
Neutrophils Relative %: 65 %
Platelet Count: 555 10*3/uL — ABNORMAL HIGH (ref 150–400)
RBC: 4.23 MIL/uL (ref 3.87–5.11)
RDW: 13.8 % (ref 11.5–15.5)
WBC Count: 7.5 10*3/uL (ref 4.0–10.5)
nRBC: 0 % (ref 0.0–0.2)

## 2023-01-31 LAB — CMP (CANCER CENTER ONLY)
ALT: 22 U/L (ref 0–44)
AST: 24 U/L (ref 15–41)
Albumin: 4.6 g/dL (ref 3.5–5.0)
Alkaline Phosphatase: 69 U/L (ref 38–126)
Anion gap: 7 (ref 5–15)
BUN: 20 mg/dL (ref 8–23)
CO2: 26 mmol/L (ref 22–32)
Calcium: 9.3 mg/dL (ref 8.9–10.3)
Chloride: 106 mmol/L (ref 98–111)
Creatinine: 1.05 mg/dL — ABNORMAL HIGH (ref 0.44–1.00)
GFR, Estimated: 54 mL/min — ABNORMAL LOW (ref >60)
Glucose, Bld: 94 mg/dL (ref 70–99)
Potassium: 4.2 mmol/L (ref 3.5–5.1)
Sodium: 139 mmol/L (ref 135–145)
Total Bilirubin: 0.5 mg/dL (ref 0.3–1.2)
Total Protein: 7.2 g/dL (ref 6.5–8.1)

## 2023-01-31 LAB — VITAMIN B12: Vitamin B-12: 1259 pg/mL — ABNORMAL HIGH (ref 180–914)

## 2023-01-31 NOTE — Progress Notes (Signed)
HEMATOLOGY/ONCOLOGY CLINIC NOTE  Date of Service: 01/31/2023  Patient Care Team: Margaree Mackintosh, MD as PCP - General (Internal Medicine) Glendale Chard, DO as Consulting Physician (Neurology)  CHIEF COMPLAINTS/PURPOSE OF CONSULTATION:  F/u for continued evaluation and mx of Essential thrombocytosis   Prior Therapy:    Anagrelide initiated briefly and discontinued due to do intolerance 2008.   High-dose prednisone due to polymyalgia rheumatica started in December 2022.  She was tapered off to 1 mg daily and was discontinued in May 2023.  HISTORY OF PRESENTING ILLNESS:  Anna Fuller is a wonderful 77 y.o. female who is here for continued evaluation and management of Essential thrombocytosis. Patient has been transferred to [place] by Dr. Clelia Croft. Patient was diagnosed with JAK-2 positive myeloproliferative disorder in 2007. Essential thrombocythemia was confirmed in 2008 by bone marrow biopsy. Patient was also diagnosed with polymyalgia rheumatica in November 2022.   Patient was last seen by Dr. Clelia Croft on 09/30/2022 and she complained of left knee pain and mild myalgias. Patient's current treatment includes Hydroxyurea 1500 mg daily which was started in 2009.   Patient reports she has been doing well overall without any new medical concerns since her last visit with Dr. Clelia Croft. Patient notes she was having severe bone pain last year and she had bone marrow biopsy, but denies bone density study. Patient was started on prednisone last year for around 6 months when she had severe bone pain.   She notes she has discontinued Simvastatin 40 mg last year after she had severe bone pain. She is now taking Rosuvastatin 5 mg. Patient notes her bone pain is still there with Rosuvastatin, but not as severe.   She does take multi-vitamin supplements and Vitamin-D 1,000 mg every other day. She denies taking Vitamin B-12 supplement.   She denies infection issues, fever, chills, night sweats, infection  issues, abdominal pain, chest pain, back pain, or leg swelling. She denies any past medical history of blood clots. Patient reports no injuries to her spleen.  During this visit, patient still complains of consistent bone pain, but not as severe as last year. She notes her pain is primarily near her neck and shoulder. She has been evaluated by rheumatologist. She takes tramadol for pain management.   Patient does have osteoarthritis which is getting evaluated and managed by her orthopedic physician. Her osteoarthritis was diagnosed due to left knee pain.   Patient has received her influenza vaccine, COVID-19 Booster, Pneumonia vaccine, and Shingles vaccine. She denies RSV vaccine.   INTERVAL HISTORY:  Anna Fuller is a wonderful 77 y.o. female who is here for continued evaluation and management of Essential thrombocytosis.  Patient was last seen by me on 12/05/2022 and she complained of consistent bone pain and left knee pain.   Patient reports she has been doing well overall since our last visit. She complains of intermittent tingling sensations throughout the body. She reports that her episodes last for about an hour several times a day and it wakes her up at night. Patient notes she had similar episodes of intermittent tingling sensation when she was first diagnosed with thrombocytosis.   She denies any new infections issues, fever, chills, night sweats, neck pain, dizziness, fatigue, seizure symptoms, shortness of breath, headaches, chest pain, abdominal pain, back pain, or leg swelling.   She is complaint with all of her medications. However, she has started cholesterol medication since our last visit. She regularly takes her hydroxyurea as prescribed without any new or  severe toxicities.   She takes multi-vitamin supplements and Vitamin-D 1,000 mg every other day. She denies taking iron supplement or B-Complex.   Patient notes she has never had phlebotomy.    MEDICAL HISTORY:   Past Medical History:  Diagnosis Date   Cancer [facility])    essential thrombocythemia   Hyperlipidemia    Macular degeneration    Neuromuscular disorder (HCC)    chemo induced neuropathy   Thrombocytosis     SURGICAL HISTORY: Past Surgical History:  Procedure Laterality Date   ABDOMINAL HYSTERECTOMY     CHOLECYSTECTOMY     COLONOSCOPY     extra bones removed     on feet, knees and wrist   POLYPECTOMY     TONSILLECTOMY     TUMOR EXCISION     left leg    SOCIAL HISTORY: Social History   Socioeconomic History   Marital status: Single    Spouse name: Not on file   Number of children: Not on file   Years of education: Not on file   Highest education level: Not on file  Occupational History   Not on file  Tobacco Use   Smoking status: Former    Types: Cigarettes    Quit date: 08/18/2002    Years since quitting: 20.4   Smokeless tobacco: Never  Vaping Use   Vaping Use: Never used  Substance and Sexual Activity   Alcohol use: Yes    Alcohol/week: 0.0 standard drinks of alcohol    Comment: rare   Drug use: No   Sexual activity: Never  Other Topics Concern   Not on file  Social History Narrative   Right handed    Lives alone   Social Determinants of Health   Financial Resource Strain: Low Risk  (12/15/2022)   Overall Financial Resource Strain (CARDIA)    Difficulty of Paying Living Expenses: Not hard at all  Food Insecurity: No Food Insecurity (12/15/2022)   Hunger Vital Sign    Worried About Running Out of Food in the Last Year: Never true    Ran Out of Food in the Last Year: Never true  Transportation Needs: No Transportation Needs (12/15/2022)   PRAPARE - Administrator, Civil Service (Medical): No    Lack of Transportation (Non-Medical): No  Physical Activity: Not on file  Stress: Not on file  Social Connections: Not on file  Intimate Partner Violence: Not on file    FAMILY HISTORY: Family History  Problem Relation Age of Onset   Pulmonary  fibrosis Mother    Colon polyps Mother    Diabetes Father    Esophageal cancer Maternal Uncle    Breast cancer Sister    Colon cancer Neg Hx    Stomach cancer Neg Hx    Rectal cancer Neg Hx     ALLERGIES:  is allergic to codeine, iodine, and penicillins.  MEDICATIONS:  Current Outpatient Medications  Medication Sig Dispense Refill   aspirin 81 MG tablet Take 81 mg by mouth daily.     cholecalciferol (VITAMIN D) 1000 UNITS tablet Take 2,000 Units by mouth daily.     hydroxyurea (HYDREA) 500 MG capsule TAKE 3 CAPSULES BY MOUTH EVERY DAY 270 capsule 4   Multiple Vitamins-Minerals (ICAPS AREDS 2 PO) Take by mouth.     rosuvastatin (CRESTOR) 5 MG tablet Take 1 tablet (5 mg total) by mouth daily. 90 tablet 1   Current Facility-Administered Medications  Medication Dose Route Frequency Provider Last Rate Last Admin  0.9 %  sodium chloride infusion  500 mL Intravenous Once Armbruster, Willaim Rayas, MD        REVIEW OF SYSTEMS:    10 Point review of Systems was done is negative except as noted above.  PHYSICAL EXAMINATION: ECOG PERFORMANCE STATUS: 1 - Symptomatic but completely ambulatory  . Vitals:   01/31/23 0850  BP: (!) 128/56  Pulse: 82  Resp: 16  Temp: (!) 97.3 F (36.3 C)  SpO2: 99%   Filed Weights   01/31/23 0850  Weight: 127 lb 9.6 oz (57.9 kg)   .Body mass index is 23.34 kg/m.  GENERAL:alert, in no acute distress and comfortable SKIN: no acute rashes, no significant lesions EYES: conjunctiva are pink and non-injected, sclera anicteric OROPHARYNX: MMM, no exudates, no oropharyngeal erythema or ulceration NECK: supple, no JVD LYMPH:  no palpable lymphadenopathy in the cervical, axillary or inguinal regions LUNGS: clear to auscultation b/l with normal respiratory effort HEART: regular rate & rhythm ABDOMEN:  normoactive bowel sounds , non tender, not distended. No palpable hepatospelnomegaly Extremity: no pedal edema PSYCH: alert & oriented x 3 with fluent  speech NEURO: no focal motor/sensory deficits  LABORATORY DATA:  I have reviewed the data as listed .    Latest Ref Rng & Units 01/31/2023    7:58 AM 11/29/2022    9:36 AM 09/30/2022    8:00 AM  CBC  WBC 4.0 - 10.5 K/uL 7.5  6.7  6.0   Hemoglobin 12.0 - 15.0 g/dL 14.7  82.9  56.2   Hematocrit 36.0 - 46.0 % 47.6  43.9  42.3   Platelets 150 - 400 K/uL 555  634  602    .    Latest Ref Rng & Units 01/31/2023    7:58 AM 11/29/2022    9:36 AM 09/30/2022    8:00 AM  CMP  Glucose 70 - 99 mg/dL 94  95  88   BUN 8 - 23 mg/dL 20  20  16    Creatinine 0.44 - 1.00 mg/dL 1.30  8.65  7.84   Sodium 135 - 145 mmol/L 139  139  140   Potassium 3.5 - 5.1 mmol/L 4.2  4.2  4.2   Chloride 98 - 111 mmol/L 106  106  107   CO2 22 - 32 mmol/L 26  27  27    Calcium 8.9 - 10.3 mg/dL 9.3  9.4  9.6   Total Protein 6.5 - 8.1 g/dL 7.2  7.0  6.5   Total Bilirubin 0.3 - 1.2 mg/dL 0.5  0.4  0.5   Alkaline Phos 38 - 126 U/L 69  72  81   AST 15 - 41 U/L 24  20  18    ALT 0 - 44 U/L 22  16  12      RADIOGRAPHIC STUDIES: I have personally reviewed the radiological images as listed and agreed with the findings in the report. No results found.  ASSESSMENT & PLAN:   77 year old woman with:  1. Myeloproliferative disorder diagnosed in 2007.  She was found to have JAK2 positive mutation, elevated platelet count confirmed by bone marrow biopsy. She is currently on hydroxyurea with reasonable control of her platelet count.  2.  Polymyalgia rheumatica: unclear if this is related to her myeloproliferative disorder.  She is off prednisone at this time without any recent reactivation.   3.  Thrombosis prophylaxis: Risk of thrombosis remains low at this time.  Her white cell count is normal and has not had any thrombosis episodes.  PLAN: -Discussed lab results from today, 01/31/2023, with the patient. CBC shows improved Hemoglobin from 15.4 to 16.3, elevated hematocrit at 47.6, and elevated platelets at 555 K. CMP shows slightly  elevated creatinine at 1.05.  -Educated the patient about elevated hemoglobin and hematocrit. Educated the patient that we need to keep Hematocrit below 45% because of JAK-2 mutation. We will observe labs during the next visit. -Discussed with the patient that if hematocrit keeps elevating, we will have to do phlebotomy.  -Recommended to start B-complex supplement.  -Recommend staying well hydrated.  -Patient has been tolerating Hydroxyurea 1500 mg well without any severe toxicities.  -Patient can continue Hydroxyurea 1500 mg.  -Answered all of patient's questions.  FOLLOW-UP: RTC with Dr Candise Che with labs in 2 months   The total time spent in the appointment was 23 minutes* .  All of the patient's questions were answered with apparent satisfaction. The patient knows to call the clinic with any problems, questions or concerns.   Wyvonnia Lora MD MS AAHIVMS [facility] Hematology/Oncology Physician [facility]  .*Total Encounter Time as defined by the Centers for Medicare and Medicaid Services includes, in addition to the face-to-face time of a patient visit (documented in the note above) non-face-to-face time: obtaining and reviewing outside history, ordering and reviewing medications, tests or procedures, care coordination (communications with other health care professionals or caregivers) and documentation in the medical record.   I, Ok Edwards, am acting as a Neurosurgeon for Wyvonnia Lora, MD.  .I have reviewed the above documentation for accuracy and completeness, and I agree with the above. Johney Maine MD

## 2023-02-03 ENCOUNTER — Ambulatory Visit
Admission: RE | Admit: 2023-02-03 | Discharge: 2023-02-03 | Disposition: A | Discharge status: Home / Self Care | Payer: Medicare Other | Source: Ambulatory Visit | Attending: Internal Medicine | Admitting: Internal Medicine

## 2023-02-03 DIAGNOSIS — Z1231 Encounter for screening mammogram for malignant neoplasm of breast: Secondary | ICD-10-CM | POA: Diagnosis not present

## 2023-03-08 ENCOUNTER — Encounter: Payer: Self-pay | Admitting: Internal Medicine

## 2023-03-08 MED ORDER — ONDANSETRON HCL 4 MG PO TABS: 4.0000 mg | ORAL_TABLET | Freq: Three times a day (TID) | ORAL | 1 refills | Status: AC | PRN

## 2023-03-08 NOTE — Telephone Encounter (Signed)
Patient requesting Zofran for occasional nausea. Have sent in Rx. MJB, MD

## 2023-03-14 ENCOUNTER — Telehealth: Payer: Self-pay | Admitting: Gastroenterology

## 2023-03-14 ENCOUNTER — Encounter: Payer: Self-pay | Admitting: Gastroenterology

## 2023-03-14 NOTE — Telephone Encounter (Signed)
Good morning Dr. Adela Lank,     This patient called wishing to scheduled her procedure she is due for in August of this year. Since she is the age 77, would you like to schedule colonoscopy directly or would you like to see her in office prior to scheduling colonoscopy.     Thank you.

## 2023-03-14 NOTE — Telephone Encounter (Signed)
I'd like to see her in the office first to discuss this and see if she really needs another colonoscopy. Her last exam in 2019 did not have any high risk polyps, she may not need another colonoscopy. Can you book her an office visit with me to discuss? Thanks

## 2023-03-24 DIAGNOSIS — H5213 Myopia, bilateral: Secondary | ICD-10-CM | POA: Diagnosis not present

## 2023-03-24 DIAGNOSIS — H2513 Age-related nuclear cataract, bilateral: Secondary | ICD-10-CM | POA: Diagnosis not present

## 2023-03-24 DIAGNOSIS — H353131 Nonexudative age-related macular degeneration, bilateral, early dry stage: Secondary | ICD-10-CM | POA: Diagnosis not present

## 2023-04-05 ENCOUNTER — Other Ambulatory Visit: Payer: Self-pay

## 2023-04-05 DIAGNOSIS — D473 Essential (hemorrhagic) thrombocythemia: Secondary | ICD-10-CM

## 2023-04-05 DIAGNOSIS — M353 Polymyalgia rheumatica: Secondary | ICD-10-CM

## 2023-04-06 ENCOUNTER — Inpatient Hospital Stay: Payer: Medicare Other | Admitting: Hematology

## 2023-04-06 ENCOUNTER — Inpatient Hospital Stay: Payer: Medicare Other | Attending: Oncology

## 2023-04-06 VITALS — BP 131/61 | HR 69 | Temp 97.2°F | Resp 18 | Wt 126.1 lb

## 2023-04-06 DIAGNOSIS — Z833 Family history of diabetes mellitus: Secondary | ICD-10-CM | POA: Insufficient documentation

## 2023-04-06 DIAGNOSIS — Z79899 Other long term (current) drug therapy: Secondary | ICD-10-CM | POA: Diagnosis not present

## 2023-04-06 DIAGNOSIS — D473 Essential (hemorrhagic) thrombocythemia: Secondary | ICD-10-CM | POA: Diagnosis not present

## 2023-04-06 DIAGNOSIS — D471 Chronic myeloproliferative disease: Secondary | ICD-10-CM | POA: Insufficient documentation

## 2023-04-06 DIAGNOSIS — Z90710 Acquired absence of both cervix and uterus: Secondary | ICD-10-CM | POA: Diagnosis not present

## 2023-04-06 DIAGNOSIS — E785 Hyperlipidemia, unspecified: Secondary | ICD-10-CM | POA: Diagnosis not present

## 2023-04-06 DIAGNOSIS — Z87891 Personal history of nicotine dependence: Secondary | ICD-10-CM | POA: Insufficient documentation

## 2023-04-06 DIAGNOSIS — Z888 Allergy status to other drugs, medicaments and biological substances status: Secondary | ICD-10-CM | POA: Diagnosis not present

## 2023-04-06 DIAGNOSIS — Z885 Allergy status to narcotic agent status: Secondary | ICD-10-CM | POA: Diagnosis not present

## 2023-04-06 DIAGNOSIS — Z9049 Acquired absence of other specified parts of digestive tract: Secondary | ICD-10-CM | POA: Insufficient documentation

## 2023-04-06 DIAGNOSIS — M1712 Unilateral primary osteoarthritis, left knee: Secondary | ICD-10-CM | POA: Diagnosis not present

## 2023-04-06 DIAGNOSIS — M898X9 Other specified disorders of bone, unspecified site: Secondary | ICD-10-CM | POA: Insufficient documentation

## 2023-04-06 DIAGNOSIS — M542 Cervicalgia: Secondary | ICD-10-CM | POA: Diagnosis not present

## 2023-04-06 DIAGNOSIS — Z803 Family history of malignant neoplasm of breast: Secondary | ICD-10-CM | POA: Insufficient documentation

## 2023-04-06 DIAGNOSIS — Z83719 Family history of colon polyps, unspecified: Secondary | ICD-10-CM | POA: Diagnosis not present

## 2023-04-06 DIAGNOSIS — M353 Polymyalgia rheumatica: Secondary | ICD-10-CM | POA: Insufficient documentation

## 2023-04-06 DIAGNOSIS — Z800 Family history of malignant neoplasm of digestive organs: Secondary | ICD-10-CM | POA: Diagnosis not present

## 2023-04-06 DIAGNOSIS — Z880 Allergy status to penicillin: Secondary | ICD-10-CM | POA: Diagnosis not present

## 2023-04-06 DIAGNOSIS — D75839 Thrombocytosis, unspecified: Secondary | ICD-10-CM | POA: Insufficient documentation

## 2023-04-06 DIAGNOSIS — Z825 Family history of asthma and other chronic lower respiratory diseases: Secondary | ICD-10-CM | POA: Diagnosis not present

## 2023-04-06 DIAGNOSIS — R202 Paresthesia of skin: Secondary | ICD-10-CM | POA: Diagnosis not present

## 2023-04-06 LAB — CMP (CANCER CENTER ONLY)
ALT: 26 U/L (ref 0–44)
AST: 27 U/L (ref 15–41)
Albumin: 4.1 g/dL (ref 3.5–5.0)
Alkaline Phosphatase: 68 U/L (ref 38–126)
Anion gap: 8 (ref 5–15)
BUN: 27 mg/dL — ABNORMAL HIGH (ref 8–23)
CO2: 24 mmol/L (ref 22–32)
Calcium: 9.6 mg/dL (ref 8.9–10.3)
Chloride: 107 mmol/L (ref 98–111)
Creatinine: 1.02 mg/dL — ABNORMAL HIGH (ref 0.44–1.00)
GFR, Estimated: 56 mL/min — ABNORMAL LOW (ref >60)
Glucose, Bld: 88 mg/dL (ref 70–99)
Potassium: 4.1 mmol/L (ref 3.5–5.1)
Sodium: 139 mmol/L (ref 135–145)
Total Bilirubin: 0.5 mg/dL (ref 0.3–1.2)
Total Protein: 6.8 g/dL (ref 6.5–8.1)

## 2023-04-06 LAB — CBC WITH DIFFERENTIAL (CANCER CENTER ONLY)
Abs Immature Granulocytes: 0.03 10*3/uL (ref 0.00–0.07)
Basophils Absolute: 0.1 10*3/uL (ref 0.0–0.1)
Basophils Relative: 1 %
Eosinophils Absolute: 0.1 10*3/uL (ref 0.0–0.5)
Eosinophils Relative: 1 %
HCT: 46.3 % — ABNORMAL HIGH (ref 36.0–46.0)
Hemoglobin: 16 g/dL — ABNORMAL HIGH (ref 12.0–15.0)
Immature Granulocytes: 0 %
Lymphocytes Relative: 24 %
Lymphs Abs: 1.9 10*3/uL (ref 0.7–4.0)
MCH: 38.5 pg — ABNORMAL HIGH (ref 26.0–34.0)
MCHC: 34.6 g/dL (ref 30.0–36.0)
MCV: 111.3 fL — ABNORMAL HIGH (ref 80.0–100.0)
Monocytes Absolute: 0.9 10*3/uL (ref 0.1–1.0)
Monocytes Relative: 11 %
Neutro Abs: 4.7 10*3/uL (ref 1.7–7.7)
Neutrophils Relative %: 63 %
Platelet Count: 535 10*3/uL — ABNORMAL HIGH (ref 150–400)
RBC: 4.16 MIL/uL (ref 3.87–5.11)
RDW: 14.5 % (ref 11.5–15.5)
WBC Count: 7.7 10*3/uL (ref 4.0–10.5)
nRBC: 0 % (ref 0.0–0.2)

## 2023-04-06 LAB — VITAMIN B12: Vitamin B-12: 851 pg/mL (ref 180–914)

## 2023-04-06 NOTE — Progress Notes (Signed)
HEMATOLOGY/ONCOLOGY CLINIC NOTE  Date of Service: 04/06/23  Patient Care Team: Margaree Mackintosh, MD as PCP - General (Internal Medicine) Glendale Chard, DO as Consulting Physician (Neurology)  CHIEF COMPLAINTS/PURPOSE OF CONSULTATION:  F/u for continued evaluation and mx of Essential thrombocytosis   Prior Therapy:    Anagrelide initiated briefly and discontinued due to do intolerance 2008.   High-dose prednisone due to polymyalgia rheumatica started in December 2022.  She was tapered off to 1 mg daily and was discontinued in May 2023.  HISTORY OF PRESENTING ILLNESS:  Anna Fuller is a wonderful 77 y.o. female who is here for continued evaluation and management of Essential thrombocytosis. Patient has been transferred to [place] by Dr. Clelia Croft. Patient was diagnosed with JAK-2 positive myeloproliferative disorder in 2007. Essential thrombocythemia was confirmed in 2008 by bone marrow biopsy. Patient was also diagnosed with polymyalgia rheumatica in November 2022.   Patient was last seen by Dr. Clelia Croft on 09/30/2022 and she complained of left knee pain and mild myalgias. Patient's current treatment includes Hydroxyurea 1500 mg daily which was started in 2009.   Patient reports she has been doing well overall without any new medical concerns since her last visit with Dr. Clelia Croft. Patient notes she was having severe bone pain last year and she had bone marrow biopsy, but denies bone density study. Patient was started on prednisone last year for around 6 months when she had severe bone pain.   She notes she has discontinued Simvastatin 40 mg last year after she had severe bone pain. She is now taking Rosuvastatin 5 mg. Patient notes her bone pain is still there with Rosuvastatin, but not as severe.   She does take multi-vitamin supplements and Vitamin-D 1,000 mg every other day. She denies taking Vitamin B-12 supplement.   She denies infection issues, fever, chills, night sweats, infection  issues, abdominal pain, chest pain, back pain, or leg swelling. She denies any past medical history of blood clots. Patient reports no injuries to her spleen.  During this visit, patient still complains of consistent bone pain, but not as severe as last year. She notes her pain is primarily near her neck and shoulder. She has been evaluated by rheumatologist. She takes tramadol for pain management.   Patient does have osteoarthritis which is getting evaluated and managed by her orthopedic physician. Her osteoarthritis was diagnosed due to left knee pain.   Patient has received her influenza vaccine, COVID-19 Booster, Pneumonia vaccine, and Shingles vaccine. She denies RSV vaccine.   INTERVAL HISTORY:  Anna Fuller is a wonderful 77 y.o. female who is here for continued evaluation and management of Essential thrombocytosis.  Patient was last seen by me on 01/31/2023 and complained of generalized intermittent tingling sensations, but was otherwise doing well overall with no new medical concerns.   Today, she reports that she is feeling well overall since her last visit. She reports stable aches and pains and notes that Tylenol does manage her arthritis symptoms. Patient complains of two tears in her meniscus which have been intermittently bothersome.  She is tolerating Aspirin and Hydroxyurea well and denies any toxicity issues.  She reports that she does drink water regularly.   She denies any bleeding issues or chest pain. Patient complains of occasional breathing issues awakening her at night. She also notes four episodes occurring while at church.   MEDICAL HISTORY:  Past Medical History:  Diagnosis Date   Cancer [facility]    essential thrombocythemia  Hyperlipidemia    Macular degeneration    Neuromuscular disorder (HCC)    chemo induced neuropathy   Thrombocytosis     SURGICAL HISTORY: Past Surgical History:  Procedure Laterality Date   ABDOMINAL HYSTERECTOMY      CHOLECYSTECTOMY     COLONOSCOPY     extra bones removed     on feet, knees and wrist   POLYPECTOMY     TONSILLECTOMY     TUMOR EXCISION     left leg    SOCIAL HISTORY: Social History   Socioeconomic History   Marital status: Single    Spouse name: Not on file   Number of children: Not on file   Years of education: Not on file   Highest education level: Not on file  Occupational History   Not on file  Tobacco Use   Smoking status: Former    Types: Cigarettes    Quit date: 08/18/2002    Years since quitting: 20.4   Smokeless tobacco: Never  Vaping Use   Vaping Use: Never used  Substance and Sexual Activity   Alcohol use: Yes    Alcohol/week: 0.0 standard drinks of alcohol    Comment: rare   Drug use: No   Sexual activity: Never  Other Topics Concern   Not on file  Social History Narrative   Right handed    Lives alone   Social Determinants of Health   Financial Resource Strain: Low Risk  (12/15/2022)   Overall Financial Resource Strain (CARDIA)    Difficulty of Paying Living Expenses: Not hard at all  Food Insecurity: No Food Insecurity (12/15/2022)   Hunger Vital Sign    Worried About Running Out of Food in the Last Year: Never true    Ran Out of Food in the Last Year: Never true  Transportation Needs: No Transportation Needs (12/15/2022)   PRAPARE - Administrator, Civil Service (Medical): No    Lack of Transportation (Non-Medical): No  Physical Activity: Not on file  Stress: Not on file  Social Connections: Not on file  Intimate Partner Violence: Not on file    FAMILY HISTORY: Family History  Problem Relation Age of Onset   Pulmonary fibrosis Mother    Colon polyps Mother    Diabetes Father    Esophageal cancer Maternal Uncle    Breast cancer Sister    Colon cancer Neg Hx    Stomach cancer Neg Hx    Rectal cancer Neg Hx     ALLERGIES:  is allergic to codeine, iodine, and penicillins.  MEDICATIONS:  Current Outpatient Medications   Medication Sig Dispense Refill   aspirin 81 MG tablet Take 81 mg by mouth daily.     cholecalciferol (VITAMIN D) 1000 UNITS tablet Take 2,000 Units by mouth daily.     hydroxyurea (HYDREA) 500 MG capsule TAKE 3 CAPSULES BY MOUTH EVERY DAY 270 capsule 4   Multiple Vitamins-Minerals (ICAPS AREDS 2 PO) Take by mouth.     rosuvastatin (CRESTOR) 5 MG tablet Take 1 tablet (5 mg total) by mouth daily. 90 tablet 1   Current Facility-Administered Medications  Medication Dose Route Frequency Provider Last Rate Last Admin   0.9 %  sodium chloride infusion  500 mL Intravenous Once Armbruster, Willaim Rayas, MD        REVIEW OF SYSTEMS:    10 Point review of Systems was done is negative except as noted above.   PHYSICAL EXAMINATION: ECOG PERFORMANCE STATUS: 1 - Symptomatic but completely  ambulatory  . Vitals:   01/31/23 0850  BP: (!) 128/56  Pulse: 82  Resp: 16  Temp: (!) 97.3 F (36.3 C)  SpO2: 99%   Filed Weights   01/31/23 0850  Weight: 127 lb 9.6 oz (57.9 kg)   .Body mass index is 23.34 kg/m.   GENERAL:alert, in no acute distress and comfortable SKIN: no acute rashes, no significant lesions EYES: conjunctiva are pink and non-injected, sclera anicteric OROPHARYNX: MMM, no exudates, no oropharyngeal erythema or ulceration NECK: supple, no JVD LYMPH:  no palpable lymphadenopathy in the cervical, axillary or inguinal regions LUNGS: clear to auscultation b/l with normal respiratory effort HEART: regular rate & rhythm ABDOMEN:  normoactive bowel sounds , non tender, not distended. Extremity: no pedal edema PSYCH: alert & oriented x 3 with fluent speech NEURO: no focal motor/sensory deficits   LABORATORY DATA:  I have reviewed the data as listed .    Latest Ref Rng & Units 04/06/2023    8:05 AM 01/31/2023    7:58 AM 11/29/2022    9:36 AM  CBC  WBC 4.0 - 10.5 K/uL 7.7  7.5  6.7   Hemoglobin 12.0 - 15.0 g/dL 96.0  45.4  09.8   Hematocrit 36.0 - 46.0 % 46.3  47.6  43.9   Platelets  150 - 400 K/uL 535  555  634    .    Latest Ref Rng & Units 04/06/2023    8:05 AM 01/31/2023    7:58 AM 11/29/2022    9:36 AM  CMP  Glucose 70 - 99 mg/dL 88  94  95   BUN 8 - 23 mg/dL 27  20  20    Creatinine 0.44 - 1.00 mg/dL 1.19  1.47  8.29   Sodium 135 - 145 mmol/L 139  139  139   Potassium 3.5 - 5.1 mmol/L 4.1  4.2  4.2   Chloride 98 - 111 mmol/L 107  106  106   CO2 22 - 32 mmol/L 24  26  27    Calcium 8.9 - 10.3 mg/dL 9.6  9.3  9.4   Total Protein 6.5 - 8.1 g/dL 6.8  7.2  7.0   Total Bilirubin 0.3 - 1.2 mg/dL 0.5  0.5  0.4   Alkaline Phos 38 - 126 U/L 68  69  72   AST 15 - 41 U/L 27  24  20    ALT 0 - 44 U/L 26  22  16      RADIOGRAPHIC STUDIES: I have personally reviewed the radiological images as listed and agreed with the findings in the report. No results found.  ASSESSMENT & PLAN:   77 year old woman with:  1. Myeloproliferative disorder diagnosed in 2007.  She was found to have JAK2 positive mutation, elevated platelet count confirmed by bone marrow biopsy. She is currently on hydroxyurea with reasonable control of her platelet count.  2.  Polymyalgia rheumatica: unclear if this is related to her myeloproliferative disorder.  She is off prednisone at this time without any recent reactivation.   3.  Thrombosis prophylaxis: Risk of thrombosis remains low at this time.  Her white cell count is normal and has not had any thrombosis episodes.  PLAN:  -Discussed lab results on 04/06/2023 in detail with pateint. CBC showed WBC of 7.7K, hemoglobin of 16.0, hematocrit stable at 46.3 and platelets stable at 535K. -borderline polycythemia -discussed hematocrit goal of 45% due to JAK-2 mutation -CMP stable, shows mild dehydration with elevated BUN at 27, creatinine normal.  -  B12 labs pending -Patient has been tolerating Hydroxyurea 1500 mg well without any severe toxicities.  -Patient can continue Hydroxyurea 1500 mg.  -continue B-complex supplement.  -informed patient that  individuals typically lose a liter of water an hour with increased summer heat -advised patient to stay regularly hydrated with at least 2L of water daily -continue to stay regularly active -answered all of patient's questions regarding her lab work-up -discussed that if her meniscus is persistently painful, she may connect with orthopedics to be re-evaluated  FOLLOW-UP: RTC with Dr Candise Che with labs in 3 months  The total time spent in the appointment was 21 minutes* .  All of the patient's questions were answered with apparent satisfaction. The patient knows to call the clinic with any problems, questions or concerns.   Wyvonnia Lora MD MS AAHIVMS [facility] Hematology/Oncology Physician [facility]  .*Total Encounter Time as defined by the Centers for Medicare and Medicaid Services includes, in addition to the face-to-face time of a patient visit (documented in the note above) non-face-to-face time: obtaining and reviewing outside history, ordering and reviewing medications, tests or procedures, care coordination (communications with other health care professionals or caregivers) and documentation in the medical record.    I,Mitra Faeizi,acting as a Neurosurgeon for Wyvonnia Lora, MD.,have documented all relevant documentation on the behalf of Wyvonnia Lora, MD,as directed by  Wyvonnia Lora, MD while in the presence of Wyvonnia Lora, MD.  .I have reviewed the above documentation for accuracy and completeness, and I agree with the above. Johney Maine MD

## 2023-04-27 ENCOUNTER — Other Ambulatory Visit: Payer: Self-pay | Admitting: Internal Medicine

## 2023-06-03 ENCOUNTER — Other Ambulatory Visit: Payer: Medicare Other

## 2023-06-03 ENCOUNTER — Ambulatory Visit (INDEPENDENT_AMBULATORY_CARE_PROVIDER_SITE_OTHER): Payer: Medicare Other

## 2023-06-03 VITALS — BP 110/70 | HR 76 | Temp 98.1°F

## 2023-06-03 DIAGNOSIS — Z23 Encounter for immunization: Secondary | ICD-10-CM | POA: Diagnosis not present

## 2023-06-03 DIAGNOSIS — E782 Mixed hyperlipidemia: Secondary | ICD-10-CM | POA: Diagnosis not present

## 2023-06-04 LAB — LIPID PANEL
Cholesterol: 167 mg/dL (ref <200)
HDL: 65 mg/dL (ref >=50)
LDL Cholesterol (Calc): 79 mg/dL
Non-HDL Cholesterol (Calc): 102 mg/dL (ref <130)
Total CHOL/HDL Ratio: 2.6 (calc) (ref <5.0)
Triglycerides: 125 mg/dL (ref <150)

## 2023-06-04 LAB — HEPATIC FUNCTION PANEL
AG Ratio: 1.9 (calc) (ref 1.0–2.5)
ALT: 20 U/L (ref 6–29)
AST: 26 U/L (ref 10–35)
Albumin: 4.4 g/dL (ref 3.6–5.1)
Alkaline phosphatase (APISO): 83 U/L (ref 37–153)
Bilirubin, Direct: 0.1 mg/dL (ref 0.0–0.2)
Globulin: 2.3 g/dL (ref 1.9–3.7)
Indirect Bilirubin: 0.4 mg/dL (ref 0.2–1.2)
Total Bilirubin: 0.5 mg/dL (ref 0.2–1.2)
Total Protein: 6.7 g/dL (ref 6.1–8.1)

## 2023-06-09 ENCOUNTER — Other Ambulatory Visit: Payer: Self-pay

## 2023-06-09 DIAGNOSIS — D473 Essential (hemorrhagic) thrombocythemia: Secondary | ICD-10-CM

## 2023-06-10 ENCOUNTER — Inpatient Hospital Stay: Payer: Medicare Other | Attending: Oncology

## 2023-06-10 ENCOUNTER — Inpatient Hospital Stay (HOSPITAL_BASED_OUTPATIENT_CLINIC_OR_DEPARTMENT_OTHER): Payer: Medicare Other | Admitting: Hematology

## 2023-06-10 VITALS — BP 140/58 | HR 66 | Temp 98.4°F | Resp 16 | Ht 62.0 in | Wt 127.3 lb

## 2023-06-10 DIAGNOSIS — Z83719 Family history of colon polyps, unspecified: Secondary | ICD-10-CM | POA: Diagnosis not present

## 2023-06-10 DIAGNOSIS — Z87891 Personal history of nicotine dependence: Secondary | ICD-10-CM | POA: Insufficient documentation

## 2023-06-10 DIAGNOSIS — Z888 Allergy status to other drugs, medicaments and biological substances status: Secondary | ICD-10-CM | POA: Insufficient documentation

## 2023-06-10 DIAGNOSIS — Z803 Family history of malignant neoplasm of breast: Secondary | ICD-10-CM | POA: Insufficient documentation

## 2023-06-10 DIAGNOSIS — D473 Essential (hemorrhagic) thrombocythemia: Secondary | ICD-10-CM | POA: Diagnosis not present

## 2023-06-10 DIAGNOSIS — D751 Secondary polycythemia: Secondary | ICD-10-CM | POA: Insufficient documentation

## 2023-06-10 DIAGNOSIS — Z9049 Acquired absence of other specified parts of digestive tract: Secondary | ICD-10-CM | POA: Insufficient documentation

## 2023-06-10 DIAGNOSIS — M542 Cervicalgia: Secondary | ICD-10-CM | POA: Diagnosis not present

## 2023-06-10 DIAGNOSIS — Z885 Allergy status to narcotic agent status: Secondary | ICD-10-CM | POA: Insufficient documentation

## 2023-06-10 DIAGNOSIS — Z79899 Other long term (current) drug therapy: Secondary | ICD-10-CM | POA: Insufficient documentation

## 2023-06-10 DIAGNOSIS — Z880 Allergy status to penicillin: Secondary | ICD-10-CM | POA: Diagnosis not present

## 2023-06-10 DIAGNOSIS — E785 Hyperlipidemia, unspecified: Secondary | ICD-10-CM | POA: Insufficient documentation

## 2023-06-10 DIAGNOSIS — Z833 Family history of diabetes mellitus: Secondary | ICD-10-CM | POA: Insufficient documentation

## 2023-06-10 DIAGNOSIS — M1712 Unilateral primary osteoarthritis, left knee: Secondary | ICD-10-CM | POA: Insufficient documentation

## 2023-06-10 DIAGNOSIS — Z90710 Acquired absence of both cervix and uterus: Secondary | ICD-10-CM | POA: Diagnosis not present

## 2023-06-10 DIAGNOSIS — M898X9 Other specified disorders of bone, unspecified site: Secondary | ICD-10-CM | POA: Diagnosis not present

## 2023-06-10 DIAGNOSIS — Z825 Family history of asthma and other chronic lower respiratory diseases: Secondary | ICD-10-CM | POA: Insufficient documentation

## 2023-06-10 DIAGNOSIS — Z808 Family history of malignant neoplasm of other organs or systems: Secondary | ICD-10-CM | POA: Insufficient documentation

## 2023-06-10 DIAGNOSIS — D471 Chronic myeloproliferative disease: Secondary | ICD-10-CM | POA: Diagnosis not present

## 2023-06-10 DIAGNOSIS — M353 Polymyalgia rheumatica: Secondary | ICD-10-CM | POA: Diagnosis not present

## 2023-06-10 LAB — CMP (CANCER CENTER ONLY)
ALT: 21 U/L (ref 0–44)
AST: 25 U/L (ref 15–41)
Albumin: 4.2 g/dL (ref 3.5–5.0)
Alkaline Phosphatase: 82 U/L (ref 38–126)
Anion gap: 8 (ref 5–15)
BUN: 21 mg/dL (ref 8–23)
CO2: 26 mmol/L (ref 22–32)
Calcium: 9.3 mg/dL (ref 8.9–10.3)
Chloride: 105 mmol/L (ref 98–111)
Creatinine: 1.02 mg/dL — ABNORMAL HIGH (ref 0.44–1.00)
GFR, Estimated: 56 mL/min — ABNORMAL LOW (ref >60)
Glucose, Bld: 93 mg/dL (ref 70–99)
Potassium: 4.4 mmol/L (ref 3.5–5.1)
Sodium: 139 mmol/L (ref 135–145)
Total Bilirubin: 0.6 mg/dL (ref 0.3–1.2)
Total Protein: 6.8 g/dL (ref 6.5–8.1)

## 2023-06-10 LAB — CBC WITH DIFFERENTIAL (CANCER CENTER ONLY)
Abs Immature Granulocytes: 0.03 10*3/uL (ref 0.00–0.07)
Basophils Absolute: 0.1 10*3/uL (ref 0.0–0.1)
Basophils Relative: 1 %
Eosinophils Absolute: 0.1 10*3/uL (ref 0.0–0.5)
Eosinophils Relative: 2 %
HCT: 45.3 % (ref 36.0–46.0)
Hemoglobin: 15.7 g/dL — ABNORMAL HIGH (ref 12.0–15.0)
Immature Granulocytes: 0 %
Lymphocytes Relative: 23 %
Lymphs Abs: 1.7 10*3/uL (ref 0.7–4.0)
MCH: 38.3 pg — ABNORMAL HIGH (ref 26.0–34.0)
MCHC: 34.7 g/dL (ref 30.0–36.0)
MCV: 110.5 fL — ABNORMAL HIGH (ref 80.0–100.0)
Monocytes Absolute: 0.9 10*3/uL (ref 0.1–1.0)
Monocytes Relative: 11 %
Neutro Abs: 4.7 10*3/uL (ref 1.7–7.7)
Neutrophils Relative %: 63 %
Platelet Count: 519 10*3/uL — ABNORMAL HIGH (ref 150–400)
RBC: 4.1 MIL/uL (ref 3.87–5.11)
RDW: 14.8 % (ref 11.5–15.5)
WBC Count: 7.5 10*3/uL (ref 4.0–10.5)
nRBC: 0 % (ref 0.0–0.2)

## 2023-06-10 LAB — VITAMIN B12: Vitamin B-12: 1127 pg/mL — ABNORMAL HIGH (ref 180–914)

## 2023-06-10 MED ORDER — HYDROXYUREA 500 MG PO CAPS
ORAL_CAPSULE | ORAL | 4 refills | Status: AC
Start: 2023-06-10 — End: ?

## 2023-06-10 NOTE — Progress Notes (Signed)
HEMATOLOGY/ONCOLOGY CLINIC NOTE  Date of Service: 06/10/23  Patient Care Team: Margaree Mackintosh, MD as PCP - General (Internal Medicine) Glendale Chard, DO as Consulting Physician (Neurology)  CHIEF COMPLAINTS/PURPOSE OF CONSULTATION:  F/u for continued evaluation and mx of Essential thrombocytosis   Prior Therapy:    Anagrelide initiated briefly and discontinued due to do intolerance 2008.   High-dose prednisone due to polymyalgia rheumatica started in December 2022.  She was tapered off to 1 mg daily and was discontinued in May 2023.  HISTORY OF PRESENTING ILLNESS:  Anna Fuller is a wonderful 77 y.o. female who is here for continued evaluation and management of Essential thrombocytosis. Patient has been transferred to [place] by Dr. Clelia Croft. Patient was diagnosed with JAK-2 positive myeloproliferative disorder in 2007. Essential thrombocythemia was confirmed in 2008 by bone marrow biopsy. Patient was also diagnosed with polymyalgia rheumatica in November 2022.   Patient was last seen by Dr. Clelia Croft on 09/30/2022 and she complained of left knee pain and mild myalgias. Patient's current treatment includes Hydroxyurea 1500 mg daily which was started in 2009.   Patient reports she has been doing well overall without any new medical concerns since her last visit with Dr. Clelia Croft. Patient notes she was having severe bone pain last year and she had bone marrow biopsy, but denies bone density study. Patient was started on prednisone last year for around 6 months when she had severe bone pain.   She notes she has discontinued Simvastatin 40 mg last year after she had severe bone pain. She is now taking Rosuvastatin 5 mg. Patient notes her bone pain is still there with Rosuvastatin, but not as severe.   She does take multi-vitamin supplements and Vitamin-D 1,000 mg every other day. She denies taking Vitamin B-12 supplement.   She denies infection issues, fever, chills, night sweats, infection  issues, abdominal pain, chest pain, back pain, or leg swelling. She denies any past medical history of blood clots. Patient reports no injuries to her spleen.  During this visit, patient still complains of consistent bone pain, but not as severe as last year. She notes her pain is primarily near her neck and shoulder. She has been evaluated by rheumatologist. She takes tramadol for pain management.   Patient does have osteoarthritis which is getting evaluated and managed by her orthopedic physician. Her osteoarthritis was diagnosed due to left knee pain.   Patient has received her influenza vaccine, COVID-19 Booster, Pneumonia vaccine, and Shingles vaccine. She denies RSV vaccine.   INTERVAL HISTORY:  Anna Fuller is a wonderful 77 y.o. female who is here for continued evaluation and management of Essential thrombocytosis.  Patient was last seen by me on 04/06/2023 and reported two tears in her meniscus and occasional breathing issues.   Today, she reports that she has been doing well overall since her last clinical visits.   She reports that she is UTD with her age-appropriate vaccines including RSV, shingles, and pneumonia. Patient is planning on receiving COVID-19 booster vaccine soon. She notes fever for 3 days following initial COVID-19 vaccines. Her symptoms have improved with more recent vaccines.   Patient complains of stable bone pain possibly from polymyalgia . Her discomfort fluctuates with temperatures and she notes increased stiffness with cooler weather. She notes that she has been off of prednisone for the past 1.5 years ago.   Patient reports that she previously weighed 140s-150s pounds when she was initially diagnosed with essential thrombocytosis Her current weight is  in the 120s which she attributes to age. She denies any recent significant weight loss.   Patient regularly takes a multivitamin, B complex and vitamin D supplements daily.  MEDICAL HISTORY:  Past Medical  History:  Diagnosis Date   Cancer [facility])    essential thrombocythemia   Hyperlipidemia    Macular degeneration    Neuromuscular disorder (HCC)    chemo induced neuropathy   Thrombocytosis     SURGICAL HISTORY: Past Surgical History:  Procedure Laterality Date   ABDOMINAL HYSTERECTOMY     CHOLECYSTECTOMY     COLONOSCOPY     extra bones removed     on feet, knees and wrist   POLYPECTOMY     TONSILLECTOMY     TUMOR EXCISION     left leg    SOCIAL HISTORY: Social History   Socioeconomic History   Marital status: Single    Spouse name: Not on file   Number of children: Not on file   Years of education: Not on file   Highest education level: Not on file  Occupational History   Not on file  Tobacco Use   Smoking status: Former    Current packs/day: 0.00    Types: Cigarettes    Quit date: 08/18/2002    Years since quitting: 20.8   Smokeless tobacco: Never  Vaping Use   Vaping status: Never Used  Substance and Sexual Activity   Alcohol use: Yes    Alcohol/week: 0.0 standard drinks of alcohol    Comment: rare   Drug use: No   Sexual activity: Never  Other Topics Concern   Not on file  Social History Narrative   Right handed    Lives alone   Social Determinants of Health   Financial Resource Strain: Low Risk  (12/15/2022)   Overall Financial Resource Strain (CARDIA)    Difficulty of Paying Living Expenses: Not hard at all  Food Insecurity: No Food Insecurity (12/15/2022)   Hunger Vital Sign    Worried About Running Out of Food in the Last Year: Never true    Ran Out of Food in the Last Year: Never true  Transportation Needs: No Transportation Needs (12/15/2022)   PRAPARE - Administrator, Civil Service (Medical): No    Lack of Transportation (Non-Medical): No  Physical Activity: Not on file  Stress: Not on file  Social Connections: Not on file  Intimate Partner Violence: Not on file    FAMILY HISTORY: Family History  Problem Relation Age of  Onset   Pulmonary fibrosis Mother    Colon polyps Mother    Diabetes Father    Esophageal cancer Maternal Uncle    Breast cancer Sister    Colon cancer Neg Hx    Stomach cancer Neg Hx    Rectal cancer Neg Hx     ALLERGIES:  is allergic to codeine, iodine, and penicillins.  MEDICATIONS:  Current Outpatient Medications  Medication Sig Dispense Refill   aspirin 81 MG tablet Take 81 mg by mouth daily.     cholecalciferol (VITAMIN D) 1000 UNITS tablet Take 2,000 Units by mouth daily.     hydroxyurea (HYDREA) 500 MG capsule TAKE 3 CAPSULES BY MOUTH EVERY DAY 270 capsule 4   Multiple Vitamins-Minerals (ICAPS AREDS 2 PO) Take by mouth.     ondansetron (ZOFRAN) 4 MG tablet Take 1 tablet (4 mg total) by mouth every 8 (eight) hours as needed for nausea or vomiting. 20 tablet 1   rosuvastatin (CRESTOR) 5  MG tablet TAKE 1 TABLET BY MOUTH DAILY 90 tablet 3   Current Facility-Administered Medications  Medication Dose Route Frequency Provider Last Rate Last Admin   0.9 %  sodium chloride infusion  500 mL Intravenous Once Armbruster, Willaim Rayas, MD        REVIEW OF SYSTEMS:    10 Point review of Systems was done is negative except as noted above.   PHYSICAL EXAMINATION: ECOG PERFORMANCE STATUS: 1 - Symptomatic but completely ambulatory  . Vitals:   06/10/23 0902 06/10/23 0903  BP: (!) 145/62 (!) 140/58  Pulse: 66   Resp: 16   Temp: 98.4 F (36.9 C)   SpO2: 98%     Filed Weights   06/10/23 0902  Weight: 127 lb 4.8 oz (57.7 kg)    .Body mass index is 23.28 kg/m.   GENERAL:alert, in no acute distress and comfortable SKIN: no acute rashes, no significant lesions EYES: conjunctiva are pink and non-injected, sclera anicteric OROPHARYNX: MMM, no exudates, no oropharyngeal erythema or ulceration NECK: supple, no JVD LYMPH:  no palpable lymphadenopathy in the cervical, axillary or inguinal regions LUNGS: clear to auscultation b/l with normal respiratory effort HEART: regular rate &  rhythm ABDOMEN:  normoactive bowel sounds , non tender, not distended. Extremity: no pedal edema PSYCH: alert & oriented x 3 with fluent speech NEURO: no focal motor/sensory deficits   LABORATORY DATA:  I have reviewed the data as listed .    Latest Ref Rng & Units 04/06/2023    8:05 AM 01/31/2023    7:58 AM 11/29/2022    9:36 AM  CBC  WBC 4.0 - 10.5 K/uL 7.7  7.5  6.7   Hemoglobin 12.0 - 15.0 g/dL 62.1  30.8  65.7   Hematocrit 36.0 - 46.0 % 46.3  47.6  43.9   Platelets 150 - 400 K/uL 535  555  634    .    Latest Ref Rng & Units 06/03/2023    9:22 AM 04/06/2023    8:05 AM 01/31/2023    7:58 AM  CMP  Glucose 70 - 99 mg/dL  88  94   BUN 8 - 23 mg/dL  27  20   Creatinine 8.46 - 1.00 mg/dL  9.62  9.52   Sodium 841 - 145 mmol/L  139  139   Potassium 3.5 - 5.1 mmol/L  4.1  4.2   Chloride 98 - 111 mmol/L  107  106   CO2 22 - 32 mmol/L  24  26   Calcium 8.9 - 10.3 mg/dL  9.6  9.3   Total Protein 6.1 - 8.1 g/dL 6.7  6.8  7.2   Total Bilirubin 0.2 - 1.2 mg/dL 0.5  0.5  0.5   Alkaline Phos 38 - 126 U/L  68  69   AST 10 - 35 U/L 26  27  24    ALT 6 - 29 U/L 20  26  22      RADIOGRAPHIC STUDIES: I have personally reviewed the radiological images as listed and agreed with the findings in the report. No results found.  ASSESSMENT & PLAN:   77 year old woman with:  1. Myeloproliferative disorder diagnosed in 2007.  She was found to have JAK2 positive mutation, elevated platelet count confirmed by bone marrow biopsy. She is currently on hydroxyurea with reasonable control of her platelet count.  2.  Polymyalgia rheumatica: unclear if this is related to her myeloproliferative disorder.  She is off prednisone at this time without any recent reactivation.  3.  Thrombosis prophylaxis: Risk of thrombosis remains low at this time.  Her white cell count is normal and has not had any thrombosis episodes.  PLAN:  -Discussed lab results on 06/10/23 in detail with patient. CBC showed WBC of 7.5K,  hemoglobin of 15.7, and platelets improved to 519K. -no significant polycythemia at this time. Patient has had some polycythemia tendencies shown in the last few labs. Polycythemia has stabilized.  -platelets are at permissible levels of 519K -CMP stable  -patient has tolerated her current dose of Hydroxyurea with no new or severe toxicities -will continue current dose of hydroxyurea at 1500 mg po daily -continue multivitamin, vitamin D, and vitamin B complex supplements. Would recommend 2000 units of vitamin D daily -continue to stay UTD with age-appropriate vaccinations. Patient is planning to receive COVID-19 booster soon.  -advised patient to stay well hydrated  FOLLOW-UP: RTC with Dr Candise Che with labs in 2 months  The total time spent in the appointment was 20 minutes* .  All of the patient's questions were answered with apparent satisfaction. The patient knows to call the clinic with any problems, questions or concerns.   Wyvonnia Lora MD MS AAHIVMS [facility] Hematology/Oncology Physician [facility]  .*Total Encounter Time as defined by the Centers for Medicare and Medicaid Services includes, in addition to the face-to-face time of a patient visit (documented in the note above) non-face-to-face time: obtaining and reviewing outside history, ordering and reviewing medications, tests or procedures, care coordination (communications with other health care professionals or caregivers) and documentation in the medical record.    I,Mitra Faeizi,acting as a Neurosurgeon for Wyvonnia Lora, MD.,have documented all relevant documentation on the behalf of Wyvonnia Lora, MD,as directed by  Wyvonnia Lora, MD while in the presence of Wyvonnia Lora, MD.  .I have reviewed the above documentation for accuracy and completeness, and I agree with the above. Johney Maine MD

## 2023-06-13 DIAGNOSIS — Z23 Encounter for immunization: Secondary | ICD-10-CM | POA: Diagnosis not present

## 2023-06-24 ENCOUNTER — Encounter: Payer: Self-pay | Admitting: Gastroenterology

## 2023-06-24 ENCOUNTER — Ambulatory Visit (INDEPENDENT_AMBULATORY_CARE_PROVIDER_SITE_OTHER): Payer: Medicare Other | Admitting: Gastroenterology

## 2023-06-24 VITALS — BP 118/62 | HR 77 | Ht 62.0 in | Wt 127.0 lb

## 2023-06-24 DIAGNOSIS — Z86010 Personal history of colonic polyps: Secondary | ICD-10-CM

## 2023-06-24 NOTE — Progress Notes (Signed)
HPI :  77 year old female with a history of JAK2 positive myeloproliferative disorder, polycythemia, polymyalgia rheumatica, history of colon polyps here to reestablish care and discuss possible surveillance colonoscopy.  I have not seen her since August 2019.  She denies any problems with her bowels since have seen her.  She has regular bowel habits.  She has occasional rectal bleeding when wiping herself she thinks is due to hemorrhoids.  She denies any family history of colon cancer.  No abdominal pains.  Her weight is stable, she is eating okay.  She is been following with hematology, currently on hydroxyurea thrombocytosis and history of myeloproliferative disorder.  She is also diagnosed with PMR in the past few years, had been on prednisone for that.  She denies any cardiopulmonary symptoms.  We discussed today how aggressive she want to be with future colonoscopy surveillance exams.  She is hesitant to proceed with any further colonoscopies at the time of this visit which we discussed as outlined below.  She otherwise feels well without complaints today.   Prior workup: Colonoscopy 03/2015: 1. Mild diverticulosis was noted in the sigmoid colon 2. Flat polyp was found at the cecum; polypectomy was performed with cold forceps 3. Sessile polyp was found in the ascending colon; polypectomy was performed with cold forceps 4. Sessile polyp was found at the hepatic flexure; polypectomy was performed with a cold snare 5. Sessile polyp was found at the splenic flexure; polypectomy was performed with a cold snare 6. Two sessile polyps were found in the descending colon; polypectomies were performed with a cold snare  1. Surgical [P], ascending and cecum, polyp (3) - TUBULAR ADENOMA(X1). - HIGH GRADE DYSPLASIA IS NOT IDENTIFIED. 2. Surgical [P], hepatic flexure and descending colon, polyp (3) - TUBULAR ADENOMA(X3). - HIGH GRADE DYSPLASIA IS NOT IDENTIFIED.   Colonoscopy 04/2018: - The perianal  and digital rectal examinations were normal. - A 3 mm polyp was found in the ascending colon. The polyp was sessile. The polyp was removed with a cold snare. Resection and retrieval were complete. - A 4 mm polyp was found in the sigmoid colon. The polyp was sessile. The polyp was removed with a cold snare. Resection and retrieval were complete. - Scattered medium-mouthed diverticula were found in the left colon. - The exam was otherwise without abnormality.   Surgical [P], sigmoid and ascending, polyp (2) - TUBULAR ADENOMA (1 OF 3 FRAGMENTS) - BENIGN COLONIC MUCOSA (2 OF 3 FRAGMENTS) - NO HIGH GRADE DYSPLASIA OR MALIGNANCY IDENTIFIED   Past Medical History:  Diagnosis Date   Cancer (HCC)    essential thrombocythemia   Hyperlipidemia    Macular degeneration    Neuromuscular disorder (HCC)    chemo induced neuropathy   Polycythemia    on the line   Thrombocytosis      Past Surgical History:  Procedure Laterality Date   ABDOMINAL HYSTERECTOMY     CHOLECYSTECTOMY     COLONOSCOPY     extra bones removed     on feet, knees and wrist   POLYPECTOMY     TONSILLECTOMY     TUMOR EXCISION     left leg   Family History  Problem Relation Age of Onset   Pulmonary fibrosis Mother    Colon polyps Mother    Diabetes Father    Breast cancer Sister    Esophageal cancer Maternal Uncle    Pancreatic cancer Maternal Aunt    Colon cancer Neg Hx    Stomach cancer Neg  Hx    Rectal cancer Neg Hx    Social History   Tobacco Use   Smoking status: Former    Current packs/day: 0.00    Types: Cigarettes    Quit date: 08/18/2002    Years since quitting: 20.8   Smokeless tobacco: Never  Vaping Use   Vaping status: Never Used  Substance Use Topics   Alcohol use: Yes    Alcohol/week: 0.0 standard drinks of alcohol    Comment: rare   Drug use: No   Current Outpatient Medications  Medication Sig Dispense Refill   aspirin 81 MG tablet Take 81 mg by mouth daily.     cholecalciferol  (VITAMIN D) 1000 UNITS tablet Take 2,000 Units by mouth daily.     hydroxyurea (HYDREA) 500 MG capsule TAKE 3 CAPSULES BY MOUTH EVERY DAY 270 capsule 4   Multiple Vitamins-Minerals (ICAPS AREDS 2 PO) Take by mouth.     ondansetron (ZOFRAN) 4 MG tablet Take 1 tablet (4 mg total) by mouth every 8 (eight) hours as needed for nausea or vomiting. 20 tablet 1   No current facility-administered medications for this visit.   Allergies  Allergen Reactions   Codeine Nausea And Vomiting   Iodine Anaphylaxis    Contrast dye   Penicillins Other (See Comments)     Review of Systems: All systems reviewed and negative except where noted in HPI.   Lab Results  Component Value Date   WBC 7.5 06/10/2023   HGB 15.7 (H) 06/10/2023   HCT 45.3 06/10/2023   MCV 110.5 (H) 06/10/2023   PLT 519 (H) 06/10/2023    Lab Results  Component Value Date   NA 139 06/10/2023   CL 105 06/10/2023   K 4.4 06/10/2023   CO2 26 06/10/2023   BUN 21 06/10/2023   CREATININE 1.02 (H) 06/10/2023   GFRNONAA 56 (L) 06/10/2023   CALCIUM 9.3 06/10/2023   ALBUMIN 4.2 06/10/2023   GLUCOSE 93 06/10/2023    Lab Results  Component Value Date   ALT 21 06/10/2023   AST 25 06/10/2023   ALKPHOS 82 06/10/2023   BILITOT 0.6 06/10/2023     Physical Exam: BP 118/62   Pulse 77   Ht 5\' 2"  (1.575 m)   Wt 127 lb (57.6 kg)   SpO2 96%   BMI 23.23 kg/m  Constitutional: Pleasant,well-developed, female in no acute distress. HEENT: Normocephalic and atraumatic. Conjunctivae are normal. No scleral icterus. Neck supple.  Cardiovascular: Normal rate, regular rhythm.  Pulmonary/chest: Effort normal and breath sounds normal.  Abdominal: Soft, nondistended, nontender. There are no masses palpable. Extremities: no edema Neurological: Alert and oriented to person place and time. Skin: Skin is warm and dry. No rashes noted. Psychiatric: Normal mood and affect. Behavior is normal.   ASSESSMENT: 77 y.o. female here for assessment  of the following  1. History of colon polyps    We had a good discussion today about the patient's history of colon polyps and if she wanted to pursue any additional surveillance colonoscopies moving forward at her age.  We discussed that without a family history of colon cancer or colon polyps would normally stop routine exams around age 45.  With her history of numerous polyps in the past she would be due 5 years from her last exam.  She is currently 77 years old with a myeloproliferative malignancy.  While she is stable and otherwise feels well, we discussed risks / benefits of colonoscopy and anesthesia.  She really prefers to  avoid future colonoscopy exams given she had no high risk polyps on her last exam.  At her age I think that is reasonable.  I offered to perform a rectal exam to evaluate her rectal bleeding which is very likely due to hemorrhoids, she declined a rectal exam today in the office and did not want that evaluated.  All questions answered, she agrees with the plan, she can follow-up with me as needed for issues that arise moving forward  PLAN: No further surveillance colonoscopy exams after discussion of risks / benefits outlined above  Follow up as needed.  Harlin Rain, MD [facility] Gastroenterology

## 2023-08-09 ENCOUNTER — Other Ambulatory Visit: Payer: Self-pay

## 2023-08-09 DIAGNOSIS — D473 Essential (hemorrhagic) thrombocythemia: Secondary | ICD-10-CM

## 2023-08-10 ENCOUNTER — Inpatient Hospital Stay: Payer: Medicare Other | Attending: Oncology

## 2023-08-10 ENCOUNTER — Inpatient Hospital Stay (HOSPITAL_BASED_OUTPATIENT_CLINIC_OR_DEPARTMENT_OTHER): Payer: Medicare Other | Admitting: Hematology

## 2023-08-10 VITALS — BP 142/70 | HR 68 | Temp 97.3°F | Resp 16 | Wt 126.9 lb

## 2023-08-10 DIAGNOSIS — Z885 Allergy status to narcotic agent status: Secondary | ICD-10-CM | POA: Insufficient documentation

## 2023-08-10 DIAGNOSIS — M353 Polymyalgia rheumatica: Secondary | ICD-10-CM | POA: Diagnosis not present

## 2023-08-10 DIAGNOSIS — D75839 Thrombocytosis, unspecified: Secondary | ICD-10-CM | POA: Diagnosis not present

## 2023-08-10 DIAGNOSIS — Z888 Allergy status to other drugs, medicaments and biological substances status: Secondary | ICD-10-CM | POA: Diagnosis not present

## 2023-08-10 DIAGNOSIS — R232 Flushing: Secondary | ICD-10-CM | POA: Diagnosis not present

## 2023-08-10 DIAGNOSIS — Z79899 Other long term (current) drug therapy: Secondary | ICD-10-CM | POA: Diagnosis not present

## 2023-08-10 DIAGNOSIS — Z860100 Personal history of colon polyps, unspecified: Secondary | ICD-10-CM | POA: Insufficient documentation

## 2023-08-10 DIAGNOSIS — D471 Chronic myeloproliferative disease: Secondary | ICD-10-CM | POA: Insufficient documentation

## 2023-08-10 DIAGNOSIS — Z833 Family history of diabetes mellitus: Secondary | ICD-10-CM | POA: Diagnosis not present

## 2023-08-10 DIAGNOSIS — Z7964 Long term (current) use of myelosuppressive agent: Secondary | ICD-10-CM | POA: Diagnosis not present

## 2023-08-10 DIAGNOSIS — D473 Essential (hemorrhagic) thrombocythemia: Secondary | ICD-10-CM | POA: Insufficient documentation

## 2023-08-10 DIAGNOSIS — Z880 Allergy status to penicillin: Secondary | ICD-10-CM | POA: Diagnosis not present

## 2023-08-10 DIAGNOSIS — M898X9 Other specified disorders of bone, unspecified site: Secondary | ICD-10-CM | POA: Insufficient documentation

## 2023-08-10 DIAGNOSIS — M542 Cervicalgia: Secondary | ICD-10-CM | POA: Diagnosis not present

## 2023-08-10 DIAGNOSIS — E785 Hyperlipidemia, unspecified: Secondary | ICD-10-CM | POA: Insufficient documentation

## 2023-08-10 DIAGNOSIS — Z87891 Personal history of nicotine dependence: Secondary | ICD-10-CM | POA: Diagnosis not present

## 2023-08-10 DIAGNOSIS — M1712 Unilateral primary osteoarthritis, left knee: Secondary | ICD-10-CM | POA: Diagnosis not present

## 2023-08-10 DIAGNOSIS — Z9049 Acquired absence of other specified parts of digestive tract: Secondary | ICD-10-CM | POA: Diagnosis not present

## 2023-08-10 DIAGNOSIS — Z825 Family history of asthma and other chronic lower respiratory diseases: Secondary | ICD-10-CM | POA: Insufficient documentation

## 2023-08-10 DIAGNOSIS — M25559 Pain in unspecified hip: Secondary | ICD-10-CM | POA: Insufficient documentation

## 2023-08-10 DIAGNOSIS — Z800 Family history of malignant neoplasm of digestive organs: Secondary | ICD-10-CM | POA: Insufficient documentation

## 2023-08-10 DIAGNOSIS — Z803 Family history of malignant neoplasm of breast: Secondary | ICD-10-CM | POA: Insufficient documentation

## 2023-08-10 DIAGNOSIS — D751 Secondary polycythemia: Secondary | ICD-10-CM

## 2023-08-10 DIAGNOSIS — Z91041 Radiographic dye allergy status: Secondary | ICD-10-CM | POA: Insufficient documentation

## 2023-08-10 DIAGNOSIS — Z90710 Acquired absence of both cervix and uterus: Secondary | ICD-10-CM | POA: Insufficient documentation

## 2023-08-10 DIAGNOSIS — Z83719 Family history of colon polyps, unspecified: Secondary | ICD-10-CM | POA: Insufficient documentation

## 2023-08-10 LAB — CMP (CANCER CENTER ONLY)
ALT: 16 U/L (ref 0–44)
AST: 21 U/L (ref 15–41)
Albumin: 4.7 g/dL (ref 3.5–5.0)
Alkaline Phosphatase: 84 U/L (ref 38–126)
Anion gap: 8 (ref 5–15)
BUN: 21 mg/dL (ref 8–23)
CO2: 28 mmol/L (ref 22–32)
Calcium: 9.9 mg/dL (ref 8.9–10.3)
Chloride: 104 mmol/L (ref 98–111)
Creatinine: 0.97 mg/dL (ref 0.44–1.00)
GFR, Estimated: 60 mL/min — ABNORMAL LOW (ref >60)
Glucose, Bld: 92 mg/dL (ref 70–99)
Potassium: 4.4 mmol/L (ref 3.5–5.1)
Sodium: 140 mmol/L (ref 135–145)
Total Bilirubin: 0.6 mg/dL (ref <1.2)
Total Protein: 7.5 g/dL (ref 6.5–8.1)

## 2023-08-10 LAB — CBC WITH DIFFERENTIAL (CANCER CENTER ONLY)
Abs Immature Granulocytes: 0.04 10*3/uL (ref 0.00–0.07)
Basophils Absolute: 0.1 10*3/uL (ref 0.0–0.1)
Basophils Relative: 1 %
Eosinophils Absolute: 0.1 10*3/uL (ref 0.0–0.5)
Eosinophils Relative: 2 %
HCT: 48.1 % — ABNORMAL HIGH (ref 36.0–46.0)
Hemoglobin: 16.9 g/dL — ABNORMAL HIGH (ref 12.0–15.0)
Immature Granulocytes: 1 %
Lymphocytes Relative: 20 %
Lymphs Abs: 1.5 10*3/uL (ref 0.7–4.0)
MCH: 38.6 pg — ABNORMAL HIGH (ref 26.0–34.0)
MCHC: 35.1 g/dL (ref 30.0–36.0)
MCV: 109.8 fL — ABNORMAL HIGH (ref 80.0–100.0)
Monocytes Absolute: 0.7 10*3/uL (ref 0.1–1.0)
Monocytes Relative: 10 %
Neutro Abs: 5.1 10*3/uL (ref 1.7–7.7)
Neutrophils Relative %: 66 %
Platelet Count: 610 10*3/uL — ABNORMAL HIGH (ref 150–400)
RBC: 4.38 MIL/uL (ref 3.87–5.11)
RDW: 14.6 % (ref 11.5–15.5)
WBC Count: 7.6 10*3/uL (ref 4.0–10.5)
nRBC: 0 % (ref 0.0–0.2)

## 2023-08-10 LAB — VITAMIN B12: Vitamin B-12: 1426 pg/mL — ABNORMAL HIGH (ref 180–914)

## 2023-08-10 NOTE — Progress Notes (Signed)
HEMATOLOGY/ONCOLOGY CLINIC NOTE  Date of Service: 08/10/23  Patient Care Team: Margaree Mackintosh, MD as PCP - General (Internal Medicine) Glendale Chard, DO as Consulting Physician (Neurology)  CHIEF COMPLAINTS/PURPOSE OF CONSULTATION:  F/u for continued evaluation and mx of Essential thrombocytosis   Prior Therapy:    Anagrelide initiated briefly and discontinued due to do intolerance 2008.   High-dose prednisone due to polymyalgia rheumatica started in December 2022.  She was tapered off to 1 mg daily and was discontinued in May 2023.  HISTORY OF PRESENTING ILLNESS:  Anna Fuller is a wonderful 77 y.o. female who is here for continued evaluation and management of Essential thrombocytosis. Patient has been transferred to [place] by Dr. Clelia Croft. Patient was diagnosed with JAK-2 positive myeloproliferative disorder in 2007. Essential thrombocythemia was confirmed in 2008 by bone marrow biopsy. Patient was also diagnosed with polymyalgia rheumatica in November 2022.   Patient was last seen by Dr. Clelia Croft on 09/30/2022 and she complained of left knee pain and mild myalgias. Patient's current treatment includes Hydroxyurea 1500 mg daily which was started in 2009.   Patient reports she has been doing well overall without any new medical concerns since her last visit with Dr. Clelia Croft. Patient notes she was having severe bone pain last year and she had bone marrow biopsy, but denies bone density study. Patient was started on prednisone last year for around 6 months when she had severe bone pain.   She notes she has discontinued Simvastatin 40 mg last year after she had severe bone pain. She is now taking Rosuvastatin 5 mg. Patient notes her bone pain is still there with Rosuvastatin, but not as severe.   She does take multi-vitamin supplements and Vitamin-D 1,000 mg every other day. She denies taking Vitamin B-12 supplement.   She denies infection issues, fever, chills, night sweats, infection  issues, abdominal pain, chest pain, back pain, or leg swelling. She denies any past medical history of blood clots. Patient reports no injuries to her spleen.  During this visit, patient still complains of consistent bone pain, but not as severe as last year. She notes her pain is primarily near her neck and shoulder. She has been evaluated by rheumatologist. She takes tramadol for pain management.   Patient does have osteoarthritis which is getting evaluated and managed by her orthopedic physician. Her osteoarthritis was diagnosed due to left knee pain.   Patient has received her influenza vaccine, COVID-19 Booster, Pneumonia vaccine, and Shingles vaccine. She denies RSV vaccine.   INTERVAL HISTORY:  Anna Fuller is a wonderful 77 y.o. female who is here for continued evaluation and management of Essential thrombocytosis.  Patient was last seen by me on 06/10/2023 and reported stable bone pain/stiffness.   Today, she reports that she has been doing well overall over the last couple of months.  She reports that she has been taking hydroxyurea daily and has been tolerating hydroxyurea well with no major toxicities.   She is not on a diuretic and does not take any supplements containing iron. She reports that she does stay well-hydrated.   Patient reports that at times, she endorses a sudden gasp, which she describes as a "rush of air". Her symptoms have been present for a few months. Patient does not feel SOB afterwards. Patient has no asthma or COPD history.   She denies any infection issues, new leg swelling, chest pain, SOB, stroke-like symptoms. Patient reports stable bone pain in the hip and pelvis from  polymyalgia.   She is not on any steroids at this time. However, she reports that she is considering re-starting low-dose steroids at 2 MG as it took the edge off of her pain. She will be discussing this with a different provider in February.   She reports that her hands do feel  flushed. Patient notes an episode of having "Charcoal-colored" hands.   Patient reports that she is due for a colonoscopy but is inclined not to proceed with colonoscopy after considering risks and benefits. She reports previously had 23 polyps and all but two were precancerous. Her last colonoscopy was 5 years ago, and she had two polyps at that time.   She reports that she has been raking the leaves in her yard every other day.   MEDICAL HISTORY:  Past Medical History:  Diagnosis Date   Cancer [facility])    essential thrombocythemia   Hyperlipidemia    Macular degeneration    Neuromuscular disorder (HCC)    chemo induced neuropathy   Polycythemia    on the line   Thrombocytosis     SURGICAL HISTORY: Past Surgical History:  Procedure Laterality Date   ABDOMINAL HYSTERECTOMY     CHOLECYSTECTOMY     COLONOSCOPY     extra bones removed     on feet, knees and wrist   POLYPECTOMY     TONSILLECTOMY     TUMOR EXCISION     left leg    SOCIAL HISTORY: Social History   Socioeconomic History   Marital status: Single    Spouse name: Not on file   Number of children: Not on file   Years of education: Not on file   Highest education level: Not on file  Occupational History   Not on file  Tobacco Use   Smoking status: Former    Current packs/day: 0.00    Types: Cigarettes    Quit date: 08/18/2002    Years since quitting: 20.9   Smokeless tobacco: Never  Vaping Use   Vaping status: Never Used  Substance and Sexual Activity   Alcohol use: Yes    Alcohol/week: 0.0 standard drinks of alcohol    Comment: rare   Drug use: No   Sexual activity: Never  Other Topics Concern   Not on file  Social History Narrative   Right handed    Lives alone   Social Determinants of Health   Financial Resource Strain: Low Risk  (12/15/2022)   Overall Financial Resource Strain (CARDIA)    Difficulty of Paying Living Expenses: Not hard at all  Food Insecurity: No Food Insecurity (12/15/2022)    Hunger Vital Sign    Worried About Running Out of Food in the Last Year: Never true    Ran Out of Food in the Last Year: Never true  Transportation Needs: No Transportation Needs (12/15/2022)   PRAPARE - Administrator, Civil Service (Medical): No    Lack of Transportation (Non-Medical): No  Physical Activity: Not on file  Stress: Not on file  Social Connections: Not on file  Intimate Partner Violence: Not on file    FAMILY HISTORY: Family History  Problem Relation Age of Onset   Pulmonary fibrosis Mother    Colon polyps Mother    Diabetes Father    Breast cancer Sister    Esophageal cancer Maternal Uncle    Pancreatic cancer Maternal Aunt    Colon cancer Neg Hx    Stomach cancer Neg Hx    Rectal cancer  Neg Hx     ALLERGIES:  is allergic to codeine, iodine, and penicillins.  MEDICATIONS:  Current Outpatient Medications  Medication Sig Dispense Refill   aspirin 81 MG tablet Take 81 mg by mouth daily.     cholecalciferol (VITAMIN D) 1000 UNITS tablet Take 2,000 Units by mouth daily.     hydroxyurea (HYDREA) 500 MG capsule TAKE 3 CAPSULES BY MOUTH EVERY DAY 270 capsule 4   Multiple Vitamins-Minerals (ICAPS AREDS 2 PO) Take by mouth.     ondansetron (ZOFRAN) 4 MG tablet Take 1 tablet (4 mg total) by mouth every 8 (eight) hours as needed for nausea or vomiting. 20 tablet 1   No current facility-administered medications for this visit.    REVIEW OF SYSTEMS:    10 Point review of Systems was done is negative except as noted above.   PHYSICAL EXAMINATION: ECOG PERFORMANCE STATUS: 1 - Symptomatic but completely ambulatory  . Vitals:   08/10/23 0937  BP: (!) 142/70  Pulse: 68  Resp: 16  Temp: (!) 97.3 F (36.3 C)  SpO2: 97%     Filed Weights   08/10/23 0937  Weight: 126 lb 14.4 oz (57.6 kg)     .Body mass index is 23.21 kg/m.   GENERAL:alert, in no acute distress and comfortable SKIN: no acute rashes, no significant lesions EYES: conjunctiva  are pink and non-injected, sclera anicteric OROPHARYNX: MMM, no exudates, no oropharyngeal erythema or ulceration NECK: supple, no JVD LYMPH:  no palpable lymphadenopathy in the cervical, axillary or inguinal regions LUNGS: clear to auscultation b/l with normal respiratory effort HEART: regular rate & rhythm ABDOMEN:  normoactive bowel sounds , non tender, not distended. Extremity: no pedal edema PSYCH: alert & oriented x 3 with fluent speech NEURO: no focal motor/sensory deficits    LABORATORY DATA:  I have reviewed the data as listed .    Latest Ref Rng & Units 08/10/2023    9:15 AM 06/10/2023    8:02 AM 04/06/2023    8:05 AM  CBC  WBC 4.0 - 10.5 K/uL 7.6  7.5  7.7   Hemoglobin 12.0 - 15.0 g/dL 64.4  03.4  74.2   Hematocrit 36.0 - 46.0 % 48.1  45.3  46.3   Platelets 150 - 400 K/uL 610  519  535    .    Latest Ref Rng & Units 08/10/2023    9:15 AM 06/10/2023    8:02 AM 06/03/2023    9:22 AM  CMP  Glucose 70 - 99 mg/dL 92  93    BUN 8 - 23 mg/dL 21  21    Creatinine 5.95 - 1.00 mg/dL 6.38  7.56    Sodium 433 - 145 mmol/L 140  139    Potassium 3.5 - 5.1 mmol/L 4.4  4.4    Chloride 98 - 111 mmol/L 104  105    CO2 22 - 32 mmol/L 28  26    Calcium 8.9 - 10.3 mg/dL 9.9  9.3    Total Protein 6.5 - 8.1 g/dL 7.5  6.8  6.7   Total Bilirubin <1.2 mg/dL 0.6  0.6  0.5   Alkaline Phos 38 - 126 U/L 84  82    AST 15 - 41 U/L 21  25  26    ALT 0 - 44 U/L 16  21  20      RADIOGRAPHIC STUDIES: I have personally reviewed the radiological images as listed and agreed with the findings in the report. No results found.  ASSESSMENT & PLAN:   77 year old woman with:  1. Myeloproliferative disorder diagnosed in 2007.  She was found to have JAK2 positive mutation, elevated platelet count confirmed by bone marrow biopsy. She is currently on hydroxyurea with reasonable control of her platelet count.  2.  Polymyalgia rheumatica: unclear if this is related to her myeloproliferative disorder.  She  is off prednisone at this time without any recent reactivation.   3.  Thrombosis prophylaxis: Risk of thrombosis remains low at this time.  Her white cell count is normal and has not had any thrombosis episodes.  PLAN:  -Discussed lab results on 08/10/23 in detail with patient. CBC showed WBC of 7.6K, hemoglobin of 16.9, and platelets of 610K. -Platelets increased from 519K on 06/10/2023 to 610K currently.  -She appears to be becoming more polycythemic. Her hgb was previously 15.7 on 06/10/2023 and has increased to 16.9 wkith HCT of 48 currently.  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 It would be reasonable to consider phlebotomies down the line if needed, such as if her hematocrit level continues to increase -will continue to monitor with labs in 2-3 months -answered all of patient's questions in detail  FOLLOW-UP: RTC with Dr Candise Che with labs in 2 months  The total time spent in the appointment was 21 minutes* .  All of the patient's questions were answered with apparent satisfaction. The patient knows to call the clinic with any problems, questions or concerns.   Wyvonnia Lora MD MS AAHIVMS [facility] Hematology/Oncology Physician [facility]  .*Total Encounter Time as defined by the Centers for Medicare and Medicaid Services includes, in addition to the face-to-face time of a patient visit (documented in the note above) non-face-to-face time: obtaining and reviewing outside history, ordering and reviewing medications, tests or procedures, care coordination  (communications with other health care professionals or caregivers) and documentation in the medical record.    I,Mitra Faeizi,acting as a Neurosurgeon for Wyvonnia Lora, MD.,have documented all relevant documentation on the behalf of Wyvonnia Lora, MD,as directed by  Wyvonnia Lora, MD while in the presence of Wyvonnia Lora, MD.  .I have reviewed the above documentation for accuracy and completeness, and I agree with the above. Johney Maine MD

## 2023-08-29 ENCOUNTER — Encounter: Payer: Self-pay | Admitting: Internal Medicine

## 2023-08-30 ENCOUNTER — Ambulatory Visit: Payer: Medicare Other | Admitting: Internal Medicine

## 2023-08-30 ENCOUNTER — Encounter: Payer: Self-pay | Admitting: Internal Medicine

## 2023-08-30 VITALS — BP 120/80 | HR 74 | Ht 62.0 in | Wt 126.0 lb

## 2023-08-30 DIAGNOSIS — M791 Myalgia, unspecified site: Secondary | ICD-10-CM | POA: Diagnosis not present

## 2023-08-30 DIAGNOSIS — Z1589 Genetic susceptibility to other disease: Secondary | ICD-10-CM

## 2023-08-30 DIAGNOSIS — D473 Essential (hemorrhagic) thrombocythemia: Secondary | ICD-10-CM

## 2023-08-30 LAB — SEDIMENTATION RATE: Sed Rate: 2 mm/h (ref 0–30)

## 2023-08-30 MED ORDER — PREDNISONE 1 MG PO TABS: ORAL_TABLET | ORAL | 0 refills | Status: DC

## 2023-08-30 MED ORDER — METHYLPREDNISOLONE 4 MG PO TABS: ORAL_TABLET | ORAL | 1 refills | Status: DC

## 2023-08-30 NOTE — Progress Notes (Signed)
Patient Care Team: Margaree Mackintosh, MD as PCP - General (Internal Medicine) Glendale Chard, DO as Consulting Physician (Neurology)  Visit Date: 08/30/23  Subjective:    Patient ID: Anna Fuller , Female   DOB: Dec 28, 1945, 77 y.o.    MRN: [id number]   77 y.o. Female presents today for worsening pain in legs, arms, hips, shoulders recently. History of myeloproliferative disorder diagnosed in 2007. Found to have JAK2 positive mutation, elevated platelet count confirmed by bone marrow biopsy. History of polymyalgia rheumatica. Seen by Dr. Candise Che. She had a similar episode of pain in 07/2021. Sed rate at 65 in 07/2021. Platelets at 610,000 two weeks ago.  She was seen initially by [facility] in 08/2021 for joint pain and elevated sed rate. She had complained of joint pain for years in relation to her myeloproliferative disorder. She was placed on prednisone 20 mg twice daily and this was reduced to 5 mg daily in 09/2021. She tapered off of this fully after improving. She noted 15 pounds of weight loss in fall of 2022 due to pain and lack of appetite. She was not believed to have PMR as she did not complain of shoulder, hip pain or increased pain in the morning.  Past Medical History:  Diagnosis Date   Cancer [facility])    essential thrombocythemia   Hyperlipidemia    Macular degeneration    Neuromuscular disorder (HCC)    chemo induced neuropathy   Polycythemia    on the line   Thrombocytosis      Family History  Problem Relation Age of Onset   Pulmonary fibrosis Mother    Colon polyps Mother    Diabetes Father    Breast cancer Sister    Esophageal cancer Maternal Uncle    Pancreatic cancer Maternal Aunt    Colon cancer Neg Hx    Stomach cancer Neg Hx    Rectal cancer Neg Hx     Social History   Social History Narrative   Right handed    Lives alone   Single, Retired. 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 Completed 2 years of college. Does not smoke or consume alcohol. Enjoys walking and doing yard work.   Review of Systems  Constitutional:  Negative for fever and malaise/fatigue.  HENT:  Negative for congestion.   Eyes:  Negative for blurred vision.  Respiratory:  Negative for cough and shortness of breath.   Cardiovascular:  Negative for chest pain, palpitations and leg swelling.  Gastrointestinal:  Negative for vomiting.  Musculoskeletal:  Positive for joint pain and myalgias. Negative for back pain.  Skin:  Negative for rash.  Neurological:  Negative for loss of consciousness and headaches.        Objective:   Vitals: BP 120/80   Pulse 74   Ht 5\' 2"  (1.575 m)   Wt 126 lb (57.2 kg)   SpO2 96%   BMI 23.05 kg/m    Physical Exam Vitals and nursing note reviewed.  Constitutional:      General: She is not in acute distress.    Appearance: Normal appearance. She is not toxic-appearing.  HENT:     Head: Normocephalic and atraumatic.  Pulmonary:     Effort: Pulmonary effort is normal.  Skin:    General: Skin is warm and dry.  Neurological:     Mental Status: She is alert and oriented to person, place, and time. Mental status  is at baseline.  Psychiatric:        Mood and Affect: Mood normal.        Behavior: Behavior normal.        Thought Content: Thought content normal.        Judgment: Judgment normal.       Results:   Studies obtained and personally reviewed by me:   Labs:       Component Value Date/Time   NA 140 08/10/2023 0915   NA 140 09/28/2017 0811   K 4.4 08/10/2023 0915   K 3.8 09/28/2017 0811   CL 104 08/10/2023 0915   CL 105 01/30/2013 0825   CO2 28 08/10/2023 0915   CO2 25 09/28/2017 0811   GLUCOSE 92 08/10/2023 0915   GLUCOSE 88 09/28/2017 0811   GLUCOSE 88 01/30/2013 0825   BUN 21 08/10/2023 0915   BUN 14.6 09/28/2017 0811   CREATININE 0.97 08/10/2023 0915   CREATININE 1.15 (H) 11/18/2020 0911   CREATININE 0.9 09/28/2017  0811   CALCIUM 9.9 08/10/2023 0915   CALCIUM 9.1 09/28/2017 0811   PROT 7.5 08/10/2023 0915   PROT 7.0 09/28/2017 0811   ALBUMIN 4.7 08/10/2023 0915   ALBUMIN 4.2 09/28/2017 0811   AST 21 08/10/2023 0915   AST 23 09/28/2017 0811   ALT 16 08/10/2023 0915   ALT 23 09/28/2017 0811   ALKPHOS 84 08/10/2023 0915   ALKPHOS 75 09/28/2017 0811   BILITOT 0.6 08/10/2023 0915   BILITOT 0.46 09/28/2017 0811   GFRNONAA 60 (L) 08/10/2023 0915   GFRNONAA 46 (L) 11/18/2020 0911   GFRAA 54 (L) 11/18/2020 0911     Lab Results  Component Value Date   WBC 7.6 08/10/2023   HGB 16.9 (H) 08/10/2023   HCT 48.1 (H) 08/10/2023   MCV 109.8 (H) 08/10/2023   PLT 610 (H) 08/10/2023    Lab Results  Component Value Date   CHOL 167 06/03/2023   HDL 65 06/03/2023   LDLCALC 79 06/03/2023   TRIG 125 06/03/2023   CHOLHDL 2.6 06/03/2023    No results found for: "HGBA1C"   Lab Results  Component Value Date   TSH 2.76 11/23/2022      Assessment & Plan:   Polymyalgia: prescribed Medrol 4 mg to take in tapering course. Ordered sed rate.Call if not improved in a few days.  Return in 11/2023 for health maintenance exam or as needed.    I,Alexander Ruley,acting as a Neurosurgeon for Margaree Mackintosh, MD.,have documented all relevant documentation on the behalf of Margaree Mackintosh, MD,as directed by  Margaree Mackintosh, MD while in the presence of Margaree Mackintosh, MD.   I, Margaree Mackintosh, MD, have reviewed all documentation for this visit. The documentation on 09/22/23 for the exam, diagnosis, procedures, and orders are all accurate and complete.

## 2023-09-22 ENCOUNTER — Encounter: Payer: Self-pay | Admitting: Internal Medicine

## 2023-09-22 NOTE — Patient Instructions (Signed)
Sed rate is low at 2 which is reassuring. Take Medrol in tapering course as directed and call back in a few days if not improving

## 2023-09-27 ENCOUNTER — Other Ambulatory Visit: Payer: Self-pay | Admitting: Internal Medicine

## 2023-10-04 ENCOUNTER — Other Ambulatory Visit: Payer: Self-pay

## 2023-10-04 DIAGNOSIS — D473 Essential (hemorrhagic) thrombocythemia: Secondary | ICD-10-CM

## 2023-10-04 DIAGNOSIS — D751 Secondary polycythemia: Secondary | ICD-10-CM

## 2023-10-05 ENCOUNTER — Inpatient Hospital Stay (HOSPITAL_BASED_OUTPATIENT_CLINIC_OR_DEPARTMENT_OTHER): Payer: Medicare Other | Admitting: Hematology

## 2023-10-05 ENCOUNTER — Inpatient Hospital Stay: Payer: Medicare Other | Attending: Oncology

## 2023-10-05 VITALS — BP 133/55 | HR 72 | Temp 97.8°F | Resp 18 | Ht 62.0 in | Wt 128.2 lb

## 2023-10-05 DIAGNOSIS — Z7964 Long term (current) use of myelosuppressive agent: Secondary | ICD-10-CM | POA: Insufficient documentation

## 2023-10-05 DIAGNOSIS — Z1589 Genetic susceptibility to other disease: Secondary | ICD-10-CM

## 2023-10-05 DIAGNOSIS — Z91041 Radiographic dye allergy status: Secondary | ICD-10-CM | POA: Insufficient documentation

## 2023-10-05 DIAGNOSIS — Z800 Family history of malignant neoplasm of digestive organs: Secondary | ICD-10-CM | POA: Insufficient documentation

## 2023-10-05 DIAGNOSIS — E785 Hyperlipidemia, unspecified: Secondary | ICD-10-CM | POA: Diagnosis not present

## 2023-10-05 DIAGNOSIS — Z833 Family history of diabetes mellitus: Secondary | ICD-10-CM | POA: Diagnosis not present

## 2023-10-05 DIAGNOSIS — Z880 Allergy status to penicillin: Secondary | ICD-10-CM | POA: Insufficient documentation

## 2023-10-05 DIAGNOSIS — Z9049 Acquired absence of other specified parts of digestive tract: Secondary | ICD-10-CM | POA: Diagnosis not present

## 2023-10-05 DIAGNOSIS — Z888 Allergy status to other drugs, medicaments and biological substances status: Secondary | ICD-10-CM | POA: Diagnosis not present

## 2023-10-05 DIAGNOSIS — D751 Secondary polycythemia: Secondary | ICD-10-CM | POA: Diagnosis not present

## 2023-10-05 DIAGNOSIS — D471 Chronic myeloproliferative disease: Secondary | ICD-10-CM | POA: Insufficient documentation

## 2023-10-05 DIAGNOSIS — Z90710 Acquired absence of both cervix and uterus: Secondary | ICD-10-CM | POA: Diagnosis not present

## 2023-10-05 DIAGNOSIS — I7300 Raynaud's syndrome without gangrene: Secondary | ICD-10-CM | POA: Diagnosis not present

## 2023-10-05 DIAGNOSIS — Z885 Allergy status to narcotic agent status: Secondary | ICD-10-CM | POA: Diagnosis not present

## 2023-10-05 DIAGNOSIS — Z825 Family history of asthma and other chronic lower respiratory diseases: Secondary | ICD-10-CM | POA: Insufficient documentation

## 2023-10-05 DIAGNOSIS — Z83719 Family history of colon polyps, unspecified: Secondary | ICD-10-CM | POA: Insufficient documentation

## 2023-10-05 DIAGNOSIS — M1712 Unilateral primary osteoarthritis, left knee: Secondary | ICD-10-CM | POA: Diagnosis not present

## 2023-10-05 DIAGNOSIS — Z803 Family history of malignant neoplasm of breast: Secondary | ICD-10-CM | POA: Insufficient documentation

## 2023-10-05 DIAGNOSIS — D473 Essential (hemorrhagic) thrombocythemia: Secondary | ICD-10-CM | POA: Diagnosis not present

## 2023-10-05 DIAGNOSIS — Z79899 Other long term (current) drug therapy: Secondary | ICD-10-CM | POA: Diagnosis not present

## 2023-10-05 DIAGNOSIS — M898X9 Other specified disorders of bone, unspecified site: Secondary | ICD-10-CM | POA: Diagnosis not present

## 2023-10-05 DIAGNOSIS — M25559 Pain in unspecified hip: Secondary | ICD-10-CM | POA: Diagnosis not present

## 2023-10-05 DIAGNOSIS — Z87891 Personal history of nicotine dependence: Secondary | ICD-10-CM | POA: Insufficient documentation

## 2023-10-05 DIAGNOSIS — M353 Polymyalgia rheumatica: Secondary | ICD-10-CM | POA: Insufficient documentation

## 2023-10-05 DIAGNOSIS — M542 Cervicalgia: Secondary | ICD-10-CM | POA: Insufficient documentation

## 2023-10-05 DIAGNOSIS — Z808 Family history of malignant neoplasm of other organs or systems: Secondary | ICD-10-CM | POA: Insufficient documentation

## 2023-10-05 LAB — CMP (CANCER CENTER ONLY)
ALT: 15 U/L (ref 0–44)
AST: 21 U/L (ref 15–41)
Albumin: 4.5 g/dL (ref 3.5–5.0)
Alkaline Phosphatase: 80 U/L (ref 38–126)
Anion gap: 7 (ref 5–15)
BUN: 22 mg/dL (ref 8–23)
CO2: 29 mmol/L (ref 22–32)
Calcium: 9.3 mg/dL (ref 8.9–10.3)
Chloride: 104 mmol/L (ref 98–111)
Creatinine: 1.08 mg/dL — ABNORMAL HIGH (ref 0.44–1.00)
GFR, Estimated: 52 mL/min — ABNORMAL LOW (ref >60)
Glucose, Bld: 97 mg/dL (ref 70–99)
Potassium: 4.1 mmol/L (ref 3.5–5.1)
Sodium: 140 mmol/L (ref 135–145)
Total Bilirubin: 0.6 mg/dL (ref 0.0–1.2)
Total Protein: 7 g/dL (ref 6.5–8.1)

## 2023-10-05 LAB — CBC WITH DIFFERENTIAL (CANCER CENTER ONLY)
Abs Immature Granulocytes: 0.05 10*3/uL (ref 0.00–0.07)
Basophils Absolute: 0.1 10*3/uL (ref 0.0–0.1)
Basophils Relative: 1 %
Eosinophils Absolute: 0.1 10*3/uL (ref 0.0–0.5)
Eosinophils Relative: 1 %
HCT: 47.1 % — ABNORMAL HIGH (ref 36.0–46.0)
Hemoglobin: 16.7 g/dL — ABNORMAL HIGH (ref 12.0–15.0)
Immature Granulocytes: 1 %
Lymphocytes Relative: 19 %
Lymphs Abs: 1.6 10*3/uL (ref 0.7–4.0)
MCH: 39 pg — ABNORMAL HIGH (ref 26.0–34.0)
MCHC: 35.5 g/dL (ref 30.0–36.0)
MCV: 110 fL — ABNORMAL HIGH (ref 80.0–100.0)
Monocytes Absolute: 1.1 10*3/uL — ABNORMAL HIGH (ref 0.1–1.0)
Monocytes Relative: 12 %
Neutro Abs: 5.8 10*3/uL (ref 1.7–7.7)
Neutrophils Relative %: 66 %
Platelet Count: 901 10*3/uL (ref 150–400)
RBC: 4.28 MIL/uL (ref 3.87–5.11)
RDW: 14.6 % (ref 11.5–15.5)
WBC Count: 8.7 10*3/uL (ref 4.0–10.5)
nRBC: 0 % (ref 0.0–0.2)

## 2023-10-05 LAB — VITAMIN B12: Vitamin B-12: 979 pg/mL — ABNORMAL HIGH (ref 180–914)

## 2023-10-05 NOTE — Progress Notes (Signed)
 CRITICAL VALUE STICKER  CRITICAL VALUE: platelets [phone]  RECEIVER (on-site recipient of call): Rosina   DATE & TIME NOTIFIED: 10/05/23 @ 0814  MESSENGER (representative from lab): Heather   MD NOTIFIED: Dr. Onesimo   TIME OF NOTIFICATION: 9185  RESPONSE: Patient has a f/u with Dr. Onesimo at 9 am today

## 2023-10-05 NOTE — Progress Notes (Signed)
 HEMATOLOGY/ONCOLOGY CLINIC NOTE  Date of Service: 10/05/23  Patient Care Team: Perri Ronal PARAS, MD as PCP - General (Internal Medicine) Tobie Tonita POUR, DO as Consulting Physician (Neurology)  CHIEF COMPLAINTS/PURPOSE OF CONSULTATION:  F/u for continued evaluation and mx of Essential thrombocytosis   Prior Therapy:    Anagrelide initiated briefly and discontinued due to do intolerance 2008.   High-dose prednisone  due to polymyalgia rheumatica started in December 2022.  She was tapered off to 1 mg daily and was discontinued in May 2023.  HISTORY OF PRESENTING ILLNESS:  Anna Fuller is a wonderful 78 y.o. female who is here for continued evaluation and management of Essential thrombocytosis. Patient has been transferred to us  by Dr. Amadeo. Patient was diagnosed with JAK-2 positive myeloproliferative disorder in 2007. Essential thrombocythemia was confirmed in 2008 by bone marrow biopsy. Patient was also diagnosed with polymyalgia rheumatica in November 2022.   Patient was last seen by Dr. Amadeo on 09/30/2022 and she complained of left knee pain and mild myalgias. Patient's current treatment includes Hydroxyurea  1500 mg daily which was started in 2009.   Patient reports she has been doing well overall without any new medical concerns since her last visit with Dr. Amadeo. Patient notes she was having severe bone pain last year and she had bone marrow biopsy, but denies bone density study. Patient was started on prednisone  last year for around 6 months when she had severe bone pain.   She notes she has discontinued Simvastatin  40 mg last year after she had severe bone pain. She is now taking Rosuvastatin  5 mg. Patient notes her bone pain is still there with Rosuvastatin , but not as severe.   She does take multi-vitamin supplements and Vitamin-D 1,000 mg every other day. She denies taking Vitamin B-12 supplement.   She denies infection issues, fever, chills, night sweats, infection  issues, abdominal pain, chest pain, back pain, or leg swelling. She denies any past medical history of blood clots. Patient reports no injuries to her spleen.  During this visit, patient still complains of consistent bone pain, but not as severe as last year. She notes her pain is primarily near her neck and shoulder. She has been evaluated by rheumatologist. She takes tramadol  for pain management.   Patient does have osteoarthritis which is getting evaluated and managed by her orthopedic physician. Her osteoarthritis was diagnosed due to left knee pain.   Patient has received her influenza vaccine, COVID-19 Booster, Pneumonia vaccine, and Shingles vaccine. She denies RSV vaccine.   INTERVAL HISTORY:  Anna Fuller is a wonderful 78 y.o. female who is here for continued evaluation and management of Essential thrombocytosis.  Patient was last seen by me on 08/10/2023 and reported gasping sometimes with a rush of air, stable bone pain in the hip/pelvis from polymyalgia, and flushed hands.   Today, she reports that she has been doing well overall since her last clinical visit. Patient notes that she gained two pounds since her last visit.   Patient reports worsened bone pains. She was started on Prednisone  and reports that she did not have any inflammation. She reports that she was on steroids for 1-2 weeks, which did not improve her bone pain. Patient denies any noticeable difference in bone pain with weather changes.   Patient has been taking 1500 MG hydroxyurea  regularly as prescribed. Patient reports that she has not been on a hydroxyurea  dose higher than  1500 MG previously.   She denies any changes in vision,  headaches, or new pain in hands/legs. Patient reports periodic discoloration hands, and especially in her feet. She reports previously diagnosed with Raynaud's. Patient describes the discoloration as a charcoal-gray color.   She denies any abdominal pain, fever, chills, or night  sweats. Patient complains of sleeping issues, which she attributes to her age. She generally does not nap in the afternoons. Patient does stay well hydrated.   She reports occasional breathing issues occurring over the last 6 months.   She reports that she takes 500 MG of hydroxyurea  at 11 AM and 1000 MG at night.   MEDICAL HISTORY:  Past Medical History:  Diagnosis Date   [facility])    essential thrombocythemia   Hyperlipidemia    Macular degeneration    Neuromuscular disorder (HCC)    chemo induced neuropathy   Polycythemia    on the line   Thrombocytosis     SURGICAL HISTORY: Past Surgical History:  Procedure Laterality Date   ABDOMINAL HYSTERECTOMY     CHOLECYSTECTOMY     COLONOSCOPY     extra bones removed     on feet, knees and wrist   POLYPECTOMY     TONSILLECTOMY     TUMOR EXCISION     left leg    SOCIAL HISTORY: Social History   Socioeconomic History   Marital status: Single    Spouse name: Not on file   Number of children: Not on file   Years of education: Not on file   Highest education level: Associate degree: academic program  Occupational History   Not on file  Tobacco Use   Smoking status: Former    Current packs/day: 0.00    Types: Cigarettes    Quit date: 08/18/2002    Years since quitting: 21.1   Smokeless tobacco: Never  Vaping Use   Vaping status: Never Used  Substance and Sexual Activity   Alcohol use: Yes    Alcohol/week: 0.0 standard drinks of alcohol    Comment: rare   Drug use: No   Sexual activity: Never  Other Topics Concern   Not on file  Social History Narrative   Right handed    Lives alone   Social Drivers of Health   Financial Resource Strain: Low Risk  (08/29/2023)   Overall Financial Resource Strain (CARDIA)    Difficulty of Paying Living Expenses: Not hard at all  Food Insecurity: No Food Insecurity (08/29/2023)   Hunger Vital Sign    Worried About Running Out of Food in the Last Year: Never true    Ran Out  of Food in the Last Year: Never true  Transportation Needs: No Transportation Needs (08/29/2023)   PRAPARE - Administrator, Civil Service (Medical): No    Lack of Transportation (Non-Medical): No  Physical Activity: Insufficiently Active (08/29/2023)   Exercise Vital Sign    Days of Exercise per Week: 2 days    Minutes of Exercise per Session: 50 min  Stress: No Stress Concern Present (08/29/2023)   [employer] of Occupational Health - Occupational Stress Questionnaire    Feeling of Stress : Only a little  Social Connections: Unknown (08/29/2023)   Social Connection and Isolation Panel [NHANES]    Frequency of Communication with Friends and Family: More than three times a week    Frequency of Social Gatherings with Friends and Family: More than three times a week    Attends Religious Services: More than 4 times per year    Active Member of [employer]  or Organizations: Not on file    Attends Banker Meetings: Not on file    Marital Status: Never married  Intimate Partner Violence: Not on file    FAMILY HISTORY: Family History  Problem Relation Age of Onset   Pulmonary fibrosis Mother    Colon polyps Mother    Diabetes Father    Breast cancer Sister    Esophageal cancer Maternal Uncle    Pancreatic cancer Maternal Aunt    Colon cancer Neg Hx    Stomach cancer Neg Hx    Rectal cancer Neg Hx     ALLERGIES:  is allergic to codeine, iodine, and penicillins.  MEDICATIONS:  Current Outpatient Medications  Medication Sig Dispense Refill   aspirin 81 MG tablet Take 81 mg by mouth daily.     cholecalciferol (VITAMIN D ) 1000 UNITS tablet Take 2,000 Units by mouth daily.     hydroxyurea  (HYDREA ) 500 MG capsule TAKE 3 CAPSULES BY MOUTH EVERY DAY 270 capsule 4   methylPREDNISolone  (MEDROL ) 4 MG tablet Take in tapering course as directed 6-5-4-3-2-1 21 tablet 1   Multiple Vitamins-Minerals (ICAPS AREDS 2 PO) Take by mouth.     ondansetron  (ZOFRAN ) 4 MG tablet Take  1 tablet (4 mg total) by mouth every 8 (eight) hours as needed for nausea or vomiting. 20 tablet 1   predniSONE  (DELTASONE ) 1 MG tablet Once you complete medrol  dose pack,  Take prednisone  2 mg daily by mouth. 60 tablet 0   No current facility-administered medications for this visit.    REVIEW OF SYSTEMS:    10 Point review of Systems was done is negative except as noted above.   PHYSICAL EXAMINATION: ECOG PERFORMANCE STATUS: 1 - Symptomatic but completely ambulatory  . Vitals:   10/05/23 0843  BP: (!) 133/55  Pulse: 72  Resp: 18  Temp: 97.8 F (36.6 C)  SpO2: 98%   Filed Weights   10/05/23 0843  Weight: 128 lb 3.2 oz (58.2 kg)  .Body mass index is 23.45 kg/m.  GENERAL:alert, in no acute distress and comfortable SKIN: no acute rashes, no significant lesions EYES: conjunctiva are pink and non-injected, sclera anicteric OROPHARYNX: MMM, no exudates, no oropharyngeal erythema or ulceration NECK: supple, no JVD LYMPH:  no palpable lymphadenopathy in the cervical, axillary or inguinal regions LUNGS: clear to auscultation b/l with normal respiratory effort HEART: regular rate & rhythm ABDOMEN:  normoactive bowel sounds , non tender, not distended. Extremity: no pedal edema PSYCH: alert & oriented x 3 with fluent speech NEURO: no focal motor/sensory deficits    LABORATORY DATA:  I have reviewed the data as listed .    Latest Ref Rng & Units 10/05/2023    7:39 AM 08/10/2023    9:15 AM 06/10/2023    8:02 AM  CBC  WBC 4.0 - 10.5 K/uL 8.7  7.6  7.5   Hemoglobin 12.0 - 15.0 g/dL 83.2  83.0  84.2   Hematocrit 36.0 - 46.0 % 47.1  48.1  45.3   Platelets 150 - 400 K/uL 901  610  519    .    Latest Ref Rng & Units 10/05/2023    7:39 AM 08/10/2023    9:15 AM 06/10/2023    8:02 AM  CMP  Glucose 70 - 99 mg/dL 97  92  93   BUN 8 - 23 mg/dL 22  21  21    Creatinine 0.44 - 1.00 mg/dL 8.91  9.02  8.97   Sodium 135 - 145 mmol/L  140  140  139   Potassium 3.5 - 5.1 mmol/L 4.1  4.4   4.4   Chloride 98 - 111 mmol/L 104  104  105   CO2 22 - 32 mmol/L 29  28  26    Calcium  8.9 - 10.3 mg/dL 9.3  9.9  9.3   Total Protein 6.5 - 8.1 g/dL 7.0  7.5  6.8   Total Bilirubin 0.0 - 1.2 mg/dL 0.6  0.6  0.6   Alkaline Phos 38 - 126 U/L 80  84  82   AST 15 - 41 U/L 21  21  25    ALT 0 - 44 U/L 15  16  21      RADIOGRAPHIC STUDIES: I have personally reviewed the radiological images as listed and agreed with the findings in the report. No results found.  ASSESSMENT & PLAN:   78 year old woman with:  1. Myeloproliferative disorder diagnosed in 2007.  She was found to have JAK2 positive mutation, elevated platelet count confirmed by bone marrow biopsy. She is currently on hydroxyurea  with reasonable control of her platelet count.  2.  Polymyalgia rheumatica: unclear if this is related to her myeloproliferative disorder.  She is off prednisone  at this time without any recent reactivation.   3.  Thrombosis prophylaxis: Risk of thrombosis remains low at this time.  Her white cell count is normal and has not had any thrombosis episodes.  PLAN:  -Discussed lab results on 10/05/23 in detail with patient. CBC showed WBC of 8.7K, hemoglobin of 16.7, and platelets of 901K. -platelets have increased from 610K on 08/10/2023 to 901K currently.  -possible that steroids may have affected platelet count -hematocrit was previously 48.1 on 08/10/2023 and mildly improved to 47.1 currently. Discussed hematocrit goal of 45%.  -WBC normal -patient has been tolerating 1500 MG hydroxyurea  daily with no toxicities -patient shall take an additional 500 MG tablet of hydroxyurea  twice a week and continue 1500 MG of hydroxyurea  on all other days.  -discussed that there may be a role for phlebotomy if her hematocrit increases -continue to stay well-hydrated -continue aspirin for anticoagulation -will continue to monitor with labs in 6 weeks  FOLLOW-UP: RTC with Dr Onesimo with labs in 6 weeks  The total time  spent in the appointment was 23 minutes* .  All of the patient's questions were answered with apparent satisfaction. The patient knows to call the clinic with any problems, questions or concerns.   Emaline Onesimo MD MS AAHIVMS [facility] Hematology/Oncology Physician [facility]  .*Total Encounter Time as defined by the Centers for Medicare and Medicaid Services includes, in addition to the face-to-face time of a patient visit (documented in the note above) non-face-to-face time: obtaining and reviewing outside history, ordering and reviewing medications, tests or procedures, care coordination (communications with other health care professionals or caregivers) and documentation in the medical record.    I,Mitra Faeizi,acting as a neurosurgeon for Emaline Onesimo, MD.,have documented all relevant documentation on the behalf of Emaline Onesimo, MD,as directed by  Emaline Onesimo, MD while in the presence of Emaline Onesimo, MD.  .I have reviewed the above documentation for accuracy and completeness, and I agree with the above. .Reid Nawrot Kishore Gaspard Isbell MD

## 2023-11-15 ENCOUNTER — Other Ambulatory Visit: Payer: Self-pay

## 2023-11-15 DIAGNOSIS — D751 Secondary polycythemia: Secondary | ICD-10-CM

## 2023-11-16 ENCOUNTER — Inpatient Hospital Stay (HOSPITAL_BASED_OUTPATIENT_CLINIC_OR_DEPARTMENT_OTHER): Payer: Medicare Other | Admitting: Hematology

## 2023-11-16 ENCOUNTER — Inpatient Hospital Stay: Payer: Medicare Other | Attending: Oncology

## 2023-11-16 VITALS — BP 142/66 | HR 69 | Temp 97.2°F | Resp 13 | Wt 128.1 lb

## 2023-11-16 DIAGNOSIS — D473 Essential (hemorrhagic) thrombocythemia: Secondary | ICD-10-CM | POA: Diagnosis not present

## 2023-11-16 DIAGNOSIS — Z83719 Family history of colon polyps, unspecified: Secondary | ICD-10-CM | POA: Diagnosis not present

## 2023-11-16 DIAGNOSIS — D471 Chronic myeloproliferative disease: Secondary | ICD-10-CM | POA: Insufficient documentation

## 2023-11-16 DIAGNOSIS — Z79899 Other long term (current) drug therapy: Secondary | ICD-10-CM | POA: Insufficient documentation

## 2023-11-16 DIAGNOSIS — Z803 Family history of malignant neoplasm of breast: Secondary | ICD-10-CM | POA: Insufficient documentation

## 2023-11-16 DIAGNOSIS — Z825 Family history of asthma and other chronic lower respiratory diseases: Secondary | ICD-10-CM | POA: Insufficient documentation

## 2023-11-16 DIAGNOSIS — Z880 Allergy status to penicillin: Secondary | ICD-10-CM | POA: Diagnosis not present

## 2023-11-16 DIAGNOSIS — M898X9 Other specified disorders of bone, unspecified site: Secondary | ICD-10-CM | POA: Diagnosis not present

## 2023-11-16 DIAGNOSIS — E785 Hyperlipidemia, unspecified: Secondary | ICD-10-CM | POA: Insufficient documentation

## 2023-11-16 DIAGNOSIS — D751 Secondary polycythemia: Secondary | ICD-10-CM

## 2023-11-16 DIAGNOSIS — Z888 Allergy status to other drugs, medicaments and biological substances status: Secondary | ICD-10-CM | POA: Insufficient documentation

## 2023-11-16 DIAGNOSIS — Z800 Family history of malignant neoplasm of digestive organs: Secondary | ICD-10-CM | POA: Insufficient documentation

## 2023-11-16 DIAGNOSIS — Z7964 Long term (current) use of myelosuppressive agent: Secondary | ICD-10-CM | POA: Diagnosis not present

## 2023-11-16 DIAGNOSIS — Z90710 Acquired absence of both cervix and uterus: Secondary | ICD-10-CM | POA: Diagnosis not present

## 2023-11-16 DIAGNOSIS — I7300 Raynaud's syndrome without gangrene: Secondary | ICD-10-CM | POA: Diagnosis not present

## 2023-11-16 DIAGNOSIS — Z87891 Personal history of nicotine dependence: Secondary | ICD-10-CM | POA: Insufficient documentation

## 2023-11-16 DIAGNOSIS — Z833 Family history of diabetes mellitus: Secondary | ICD-10-CM | POA: Insufficient documentation

## 2023-11-16 DIAGNOSIS — M1712 Unilateral primary osteoarthritis, left knee: Secondary | ICD-10-CM | POA: Diagnosis not present

## 2023-11-16 DIAGNOSIS — M353 Polymyalgia rheumatica: Secondary | ICD-10-CM | POA: Diagnosis not present

## 2023-11-16 DIAGNOSIS — Z9049 Acquired absence of other specified parts of digestive tract: Secondary | ICD-10-CM | POA: Diagnosis not present

## 2023-11-16 DIAGNOSIS — Z885 Allergy status to narcotic agent status: Secondary | ICD-10-CM | POA: Diagnosis not present

## 2023-11-16 DIAGNOSIS — Z7952 Long term (current) use of systemic steroids: Secondary | ICD-10-CM | POA: Insufficient documentation

## 2023-11-16 LAB — CMP (CANCER CENTER ONLY)
ALT: 12 U/L (ref 0–44)
AST: 18 U/L (ref 15–41)
Albumin: 4.5 g/dL (ref 3.5–5.0)
Alkaline Phosphatase: 78 U/L (ref 38–126)
Anion gap: 7 (ref 5–15)
BUN: 19 mg/dL (ref 8–23)
CO2: 27 mmol/L (ref 22–32)
Calcium: 9.3 mg/dL (ref 8.9–10.3)
Chloride: 105 mmol/L (ref 98–111)
Creatinine: 0.95 mg/dL (ref 0.44–1.00)
GFR, Estimated: >60 mL/min (ref >60)
Glucose, Bld: 89 mg/dL (ref 70–99)
Potassium: 4 mmol/L (ref 3.5–5.1)
Sodium: 139 mmol/L (ref 135–145)
Total Bilirubin: 0.7 mg/dL (ref 0.0–1.2)
Total Protein: 6.7 g/dL (ref 6.5–8.1)

## 2023-11-16 LAB — CBC WITH DIFFERENTIAL (CANCER CENTER ONLY)
Abs Immature Granulocytes: 0.01 10*3/uL (ref 0.00–0.07)
Basophils Absolute: 0.1 10*3/uL (ref 0.0–0.1)
Basophils Relative: 1 %
Eosinophils Absolute: 0.1 10*3/uL (ref 0.0–0.5)
Eosinophils Relative: 1 %
HCT: 45.3 % (ref 36.0–46.0)
Hemoglobin: 15.9 g/dL — ABNORMAL HIGH (ref 12.0–15.0)
Immature Granulocytes: 0 %
Lymphocytes Relative: 25 %
Lymphs Abs: 1.6 10*3/uL (ref 0.7–4.0)
MCH: 38.5 pg — ABNORMAL HIGH (ref 26.0–34.0)
MCHC: 35.1 g/dL (ref 30.0–36.0)
MCV: 109.7 fL — ABNORMAL HIGH (ref 80.0–100.0)
Monocytes Absolute: 0.6 10*3/uL (ref 0.1–1.0)
Monocytes Relative: 9 %
Neutro Abs: 4.2 10*3/uL (ref 1.7–7.7)
Neutrophils Relative %: 64 %
Platelet Count: 669 10*3/uL — ABNORMAL HIGH (ref 150–400)
RBC: 4.13 MIL/uL (ref 3.87–5.11)
RDW: 14.9 % (ref 11.5–15.5)
WBC Count: 6.5 10*3/uL (ref 4.0–10.5)
nRBC: 0 % (ref 0.0–0.2)

## 2023-11-16 LAB — VITAMIN B12: Vitamin B-12: 717 pg/mL (ref 180–914)

## 2023-11-16 NOTE — Progress Notes (Signed)
 HEMATOLOGY/ONCOLOGY CLINIC NOTE  Date of Service: 11/16/23  Patient Care Team: Margaree Mackintosh, MD as PCP - General (Internal Medicine) Glendale Chard, DO as Consulting Physician (Neurology)  CHIEF COMPLAINTS/PURPOSE OF CONSULTATION:  F/u for continued evaluation and mx of Essential thrombocytosis   Prior Therapy:    Anagrelide initiated briefly and discontinued due to do intolerance 2008.   High-dose prednisone due to polymyalgia rheumatica started in December 2022.  She was tapered off to 1 mg daily and was discontinued in May 2023.  HISTORY OF PRESENTING ILLNESS:  Anna Fuller is a wonderful 78 y.o. female who is here for continued evaluation and management of Essential thrombocytosis. Patient has been transferred to [place] by Dr. Clelia Croft. Patient was diagnosed with JAK-2 positive myeloproliferative disorder in 2007. Essential thrombocythemia was confirmed in 2008 by bone marrow biopsy. Patient was also diagnosed with polymyalgia rheumatica in November 2022.   Patient was last seen by Dr. Clelia Croft on 09/30/2022 and she complained of left knee pain and mild myalgias. Patient's current treatment includes Hydroxyurea 1500 mg daily which was started in 2009.   Patient reports she has been doing well overall without any new medical concerns since her last visit with Dr. Clelia Croft. Patient notes she was having severe bone pain last year and she had bone marrow biopsy, but denies bone density study. Patient was started on prednisone last year for around 6 months when she had severe bone pain.   She notes she has discontinued Simvastatin 40 mg last year after she had severe bone pain. She is now taking Rosuvastatin 5 mg. Patient notes her bone pain is still there with Rosuvastatin, but not as severe.   She does take multi-vitamin supplements and Vitamin-D 1,000 mg every other day. She denies taking Vitamin B-12 supplement.   She denies infection issues, fever, chills, night sweats, infection  issues, abdominal pain, chest pain, back pain, or leg swelling. She denies any past medical history of blood clots. Patient reports no injuries to her spleen.  During this visit, patient still complains of consistent bone pain, but not as severe as last year. She notes her pain is primarily near her neck and shoulder. She has been evaluated by rheumatologist. She takes tramadol for pain management.   Patient does have osteoarthritis which is getting evaluated and managed by her orthopedic physician. Her osteoarthritis was diagnosed due to left knee pain.   Patient has received her influenza vaccine, COVID-19 Booster, Pneumonia vaccine, and Shingles vaccine. She denies RSV vaccine.   INTERVAL HISTORY:  Anna Fuller is a wonderful 78 y.o. female who is here for continued evaluation and management of Essential thrombocytosis.  Patient was last seen by me on 10/05/2023 and reported worsened bone pains; periodic charcoal-gray discoloration hands, and especially in her feet, related to Raynaud's; sleeping issues attributed to age; and occasional breathing issues.   Today, she reports that she has discontinued steroids and  is not on any Prednisone at this time.   She generally stays well-hydrated. Patient is eating well and denies any infection issues.   She denies any bleeding issues, new chest pain, SOB, or new leg pain/swelling. Patient reports mild abdominal discomfort on palpation.  She reports stable mild shoulder/hip pain which she manages with hot baths.   Since her last visit, she has been hydroxyurea as prescribed, taking an additional 500 MG tablet of hydroxyurea twice a week and continuing 1500 MG of hydroxyurea on all other days. Patient complains of fatigue since  increasing her hydroxyurea dose. She also complains of rippled fingernails.   She is UTD with her age-appropriate vaccines.   Patient reports that there is some difficulty in regards to finding veins for blood draws,  especially in her left arm.   Patient notes that since having her gallbladder removed, eggs in the morning cause nausea, but has no issus consuming eggs at night.   Patient has no other new concerns.   MEDICAL HISTORY:  Past Medical History:  Diagnosis Date   Cancer [facility])    essential thrombocythemia   Hyperlipidemia    Macular degeneration    Neuromuscular disorder (HCC)    chemo induced neuropathy   Polycythemia    on the line   Thrombocytosis     SURGICAL HISTORY: Past Surgical History:  Procedure Laterality Date   ABDOMINAL HYSTERECTOMY     CHOLECYSTECTOMY     COLONOSCOPY     extra bones removed     on feet, knees and wrist   POLYPECTOMY     TONSILLECTOMY     TUMOR EXCISION     left leg    SOCIAL HISTORY: Social History   Socioeconomic History   Marital status: Single    Spouse name: Not on file   Number of children: Not on file   Years of education: Not on file   Highest education level: Associate degree: academic program  Occupational History   Not on file  Tobacco Use   Smoking status: Former    Current packs/day: 0.00    Types: Cigarettes    Quit date: 08/18/2002    Years since quitting: 21.2   Smokeless tobacco: Never  Vaping Use   Vaping status: Never Used  Substance and Sexual Activity   Alcohol use: Yes    Alcohol/week: 0.0 standard drinks of alcohol    Comment: rare   Drug use: No   Sexual activity: Never  Other Topics Concern   Not on file  Social History Narrative   Right handed    Lives alone   Social Drivers of Health   Financial Resource Strain: Low Risk  (08/29/2023)   Overall Financial Resource Strain (CARDIA)    Difficulty of Paying Living Expenses: Not hard at all  Food Insecurity: No Food Insecurity (08/29/2023)   Hunger Vital Sign    Worried About Running Out of Food in the Last Year: Never true    Ran Out of Food in the Last Year: Never true  Transportation Needs: No Transportation Needs (08/29/2023)   PRAPARE -  Administrator, Civil Service (Medical): No    Lack of Transportation (Non-Medical): No  Physical Activity: Insufficiently Active (08/29/2023)   Exercise Vital Sign    Days of Exercise per Week: 2 days    Minutes of Exercise per Session: 50 min  Stress: No Stress Concern Present (08/29/2023)   [employer] of Occupational Health - Occupational Stress Questionnaire    Feeling of Stress : Only a little  Social Connections: Unknown (08/29/2023)   Social Connection and Isolation Panel [NHANES]    Frequency of Communication with Friends and Family: More than three times a week    Frequency of Social Gatherings with Friends and Family: More than three times a week    Attends Religious Services: More than 4 times per year    Active Member of [employer] or Organizations: Not on file    Attends Banker Meetings: Not on file    Marital Status: Never married  Intimate  Partner Violence: Not on file    FAMILY HISTORY: Family History  Problem Relation Age of Onset   Pulmonary fibrosis Mother    Colon polyps Mother    Diabetes Father    Breast cancer Sister    Esophageal cancer Maternal Uncle    Pancreatic cancer Maternal Aunt    Colon cancer Neg Hx    Stomach cancer Neg Hx    Rectal cancer Neg Hx     ALLERGIES:  is allergic to codeine, iodine, and penicillins.  MEDICATIONS:  Current Outpatient Medications  Medication Sig Dispense Refill   aspirin 81 MG tablet Take 81 mg by mouth daily.     cholecalciferol (VITAMIN D) 1000 UNITS tablet Take 2,000 Units by mouth daily.     hydroxyurea (HYDREA) 500 MG capsule TAKE 3 CAPSULES BY MOUTH EVERY DAY 270 capsule 4   methylPREDNISolone (MEDROL) 4 MG tablet Take in tapering course as directed 6-5-4-3-2-1 21 tablet 1   Multiple Vitamins-Minerals (ICAPS AREDS 2 PO) Take by mouth.     ondansetron (ZOFRAN) 4 MG tablet Take 1 tablet (4 mg total) by mouth every 8 (eight) hours as needed for nausea or vomiting. 20 tablet 1    predniSONE (DELTASONE) 1 MG tablet Once you complete medrol dose pack,  Take prednisone 2 mg daily by mouth. 60 tablet 0   No current facility-administered medications for this visit.    REVIEW OF SYSTEMS:    10 Point review of Systems was done is negative except as noted above.   PHYSICAL EXAMINATION: ECOG PERFORMANCE STATUS: 1 - Symptomatic but completely ambulatory  . Vitals:   11/16/23 1020  BP: (!) 142/66  Pulse: 69  Resp: 13  Temp: (!) 97.2 F (36.2 C)  SpO2: 98%    Filed Weights   11/16/23 1020  Weight: 128 lb 1.6 oz (58.1 kg)   .Body mass index is 23.43 kg/m.   GENERAL:alert, in no acute distress and comfortable SKIN: no acute rashes, no significant lesions EYES: conjunctiva are pink and non-injected, sclera anicteric OROPHARYNX: MMM, no exudates, no oropharyngeal erythema or ulceration NECK: supple, no JVD LYMPH:  no palpable lymphadenopathy in the cervical, axillary or inguinal regions LUNGS: clear to auscultation b/l with normal respiratory effort HEART: regular rate & rhythm ABDOMEN:  normoactive bowel sounds , non tender, not distended. Extremity: no pedal edema PSYCH: alert & oriented x 3 with fluent speech NEURO: no focal motor/sensory deficits    LABORATORY DATA:  I have reviewed the data as listed .    Latest Ref Rng & Units 11/16/2023   10:05 AM 10/05/2023    7:39 AM 08/10/2023    9:15 AM  CBC  WBC 4.0 - 10.5 K/uL 6.5  8.7  7.6   Hemoglobin 12.0 - 15.0 g/dL 16.1  09.6  04.5   Hematocrit 36.0 - 46.0 % 45.3  47.1  48.1   Platelets 150 - 400 K/uL 669  901  610    .    Latest Ref Rng & Units 11/16/2023   10:05 AM 10/05/2023    7:39 AM 08/10/2023    9:15 AM  CMP  Glucose 70 - 99 mg/dL 89  97  92   BUN 8 - 23 mg/dL 19  22  21    Creatinine 0.44 - 1.00 mg/dL 4.09  8.11  9.14   Sodium 135 - 145 mmol/L 139  140  140   Potassium 3.5 - 5.1 mmol/L 4.0  4.1  4.4   Chloride 98 - 111  mmol/L 105  104  104   CO2 22 - 32 mmol/L 27  29  28    Calcium  8.9 - 10.3 mg/dL 9.3  9.3  9.9   Total Protein 6.5 - 8.1 g/dL 6.7  7.0  7.5   Total Bilirubin 0.0 - 1.2 mg/dL 0.7  0.6  0.6   Alkaline Phos 38 - 126 U/L 78  80  84   AST 15 - 41 U/L 18  21  21    ALT 0 - 44 U/L 12  15  16      RADIOGRAPHIC STUDIES: I have personally reviewed the radiological images as listed and agreed with the findings in the report. No results found.  ASSESSMENT & PLAN:   78 year old woman with:  1. Myeloproliferative disorder diagnosed in 2007.  She was found to have JAK2 positive mutation, elevated platelet count confirmed by bone marrow biopsy. She is currently on hydroxyurea with reasonable control of her platelet count.  2.  Polymyalgia rheumatica: unclear if this is related to her myeloproliferative disorder.  She is off prednisone at this time without any recent reactivation.   3.  Thrombosis prophylaxis: Risk of thrombosis remains low at this time.  Her white cell count is normal and has not had any thrombosis episodes.  PLAN:  -Discussed lab results on 11/16/23 in detail with patient. CBC showed WBC of 6.5K, hemoglobin of 15.9, and platelets of 669K. -her platelets have returned to baseline after previously being elevated at 900K in January related to steroid use. -hematocrit improved from 47.1% to 45.3% -patient complains of fatigue with taking an additional 500 MG tablet of hydroxyurea twice a week and 1500 MG of hydroxyurea on all other days.  -will adjust hydroxyurea dose back to three tablets, 1500 MG total, daily -educated patient that hydroxyurea can cause changes in the fingernails -recommend taking B vitamin supplementation in addition to vitamin D to support the bone marrow and healing -discussed option of having less frequent lab work to every 3 months due to difficulties with finding vein, which she would not like to try at this time.  -will repeat labs in 2 months  FOLLOW-UP: RTC with Dr Candise Che with labs in 2 months  The total time spent in  the appointment was 21 minutes* .  All of the patient's questions were answered with apparent satisfaction. The patient knows to call the clinic with any problems, questions or concerns.   Wyvonnia Lora MD MS AAHIVMS [facility] Hematology/Oncology Physician [facility]  .*Total Encounter Time as defined by the Centers for Medicare and Medicaid Services includes, in addition to the face-to-face time of a patient visit (documented in the note above) non-face-to-face time: obtaining and reviewing outside history, ordering and reviewing medications, tests or procedures, care coordination (communications with other health care professionals or caregivers) and documentation in the medical record.    I,Mitra Faeizi,acting as a Neurosurgeon for Wyvonnia Lora, MD.,have documented all relevant documentation on the behalf of Wyvonnia Lora, MD,as directed by  Wyvonnia Lora, MD while in the presence of Wyvonnia Lora, MD.  .I have reviewed the above documentation for accuracy and completeness, and I agree with the above. Johney Maine MD

## 2023-11-23 NOTE — Progress Notes (Signed)
 Annual Medicare Wellness Visit   Patient Care Team: Martin Belling, Luanna Cole, MD as PCP - General (Internal Medicine) Glendale Chard, DO as Consulting Physician (Neurology)  Visit Date: 12/05/23   Chief Complaint  Patient presents with   Medicare Wellness   Subjective:  Patient: Anna Fuller, Female DOB: 07-Dec-1945, 78 y.o. MRN: [id number] Anna Fuller is a 78 y.o. Female who presents today for her Annual Medicare Wellness Visit. Patient has history of cancer, hyperlipidemia, macular degeneration, neuromuscular disorder, thrombocytosis.   History of Polymyalgia seen initially by [facility] in 08/2021 for joint pain and elevated sed rate. She had complained of joint pain for years in relation to her myeloproliferative disorder. She was not believed to have PMR as she did not complain of shoulder, hip pain or increased pain in the morning. Was placed on Medrol tapering course 08/30/2023 and started 2 mg Deltasone daily after completing tapering. Essential Thrombocytosis JAK2 Myeloproliferative Disorder, diagnosed 2007 and JAK2 mutation with an elevated platelet count confirmed via bone marrow biopsy. Treated with Hydrea 1500 mg daily. Dr. Clelia Croft ordered bone marrow biopsy and CT scan on 09/14/21 and PET scan on 08/24/21 to determine if cancer had worsened, it had not. She has a new oncologist, Dr. Wyvonnia Lora. 11/16/2023 CBC from [facility], compared to 10/05/23: Hgb 15.9, decreased from 16.7; MCV 109.7, decreased slightly from 110; MCH 38.5, decreased slightly from 39; Platelets 669, decreased significantly from 901. Says that when her platelets had raised to 901 Dr. Candise Che instructed her to increase her Hydrea for 6 weeks, and her count decreased to 669; according to her, Dr. Candise Che believes that she is developing polycythemia.    History of Bilateral Shoulder & Knee Pain; Arthritis treated with Ultram 50 mg every 6 hours as needed.   History of Mixed Hyperlipidemia. Stopped taking a  statin in 2022 due to myalgias, according to prior records. 12/02/2023 Lipid Panel, compared to 11/23/2022: Cholesterol 248, elevated from 244; LDL 135, decreased slightly from 138 but still elevated. Discussed starting a low-dose statin, which she is agreeable to doing to to mitigate further risk factors w/ her hx of essential thrombocytosis.   History of Vitamin-D Deficiency treated with Vitamin D 2,000 units daily.   Labs 12/02/2023 CMP, compared to 11/16/2023 from [facility]: eGFR 57, down from > 60; Potassium 5.9, elevated from 4.0; otherwise WNL.  TSH: 3.05  Mammogram 02/03/2023 normal with repeat recommendation of 2025.  Colonoscopy 05/03/2018 with 2 total Polyps removed - 2 mm sessile from ascending colon & 4 mm sessile from sigmoid. Says that when she went to schedule her colonoscopy she was told that due to her age it is recommended that she does not have a repeat colonoscopy, so these have been discontinued.   Overdue for Bone Density -- ordered today.  Vaccine Counseling: UTD on Flu, Shingles 2/2, and PNA.  Past Medical History:  Diagnosis Date   Cancer [facility])    essential thrombocythemia   Hyperlipidemia    Macular degeneration    Neuromuscular disorder (HCC)    chemo induced neuropathy   Polycythemia    on the line   Thrombocytosis    Family History  Problem Relation Age of Onset   Pulmonary fibrosis Mother    Colon polyps Mother    Diabetes Father    Breast cancer Sister    Esophageal cancer Maternal Uncle    Pancreatic cancer Maternal Aunt    Colon cancer Neg Hx    Stomach cancer  Neg Hx    Rectal cancer Neg Hx    Social History   Social History Narrative   Right handed    Lives alone   Review of Systems  Constitutional:  Negative for chills, fever, malaise/fatigue and weight loss.  HENT:  Negative for hearing loss, sinus pain and sore throat.   Respiratory:  Negative for cough, hemoptysis and shortness of breath.   Cardiovascular:  Negative for chest pain,  palpitations, leg swelling and PND.  Gastrointestinal:  Negative for abdominal pain, constipation, diarrhea, heartburn, nausea and vomiting.  Genitourinary:  Negative for dysuria, frequency and urgency.  Musculoskeletal:  Negative for back pain, myalgias and neck pain.  Skin:  Negative for itching and rash.  Neurological:  Negative for dizziness, tingling, seizures and headaches.  Endo/Heme/Allergies:  Negative for polydipsia.  Psychiatric/Behavioral:  Negative for depression. The patient is not nervous/anxious.     Objective:  Vitals: BP 130/80   Pulse 70   Wt 128 lb (58.1 kg)   SpO2 98%   BMI 23.41 kg/m  Physical Exam Vitals and nursing note reviewed.  Constitutional:      General: She is not in acute distress.    Appearance: Normal appearance. She is not ill-appearing or toxic-appearing.  HENT:     Head: Normocephalic and atraumatic.     Right Ear: Hearing, tympanic membrane, ear canal and external ear normal.     Left Ear: Hearing, tympanic membrane, ear canal and external ear normal.     Mouth/Throat:     Pharynx: Oropharynx is clear.  Eyes:     Extraocular Movements: Extraocular movements intact.     Pupils: Pupils are equal, round, and reactive to light.  Neck:     Thyroid: No thyroid mass, thyromegaly or thyroid tenderness.     Vascular: No carotid bruit.  Cardiovascular:     Rate and Rhythm: Normal rate and regular rhythm. No extrasystoles are present.    Pulses:          Dorsalis pedis pulses are 1+ on the right side and 1+ on the left side.     Heart sounds: Normal heart sounds. No murmur heard.    No friction rub. No gallop.  Pulmonary:     Effort: Pulmonary effort is normal.     Breath sounds: Normal breath sounds. No decreased breath sounds, wheezing, rhonchi or rales.  Chest:     Chest wall: No mass.  Abdominal:     Palpations: Abdomen is soft. There is no hepatomegaly, splenomegaly or mass.     Tenderness: There is no abdominal tenderness.     Hernia: No  hernia is present.  Musculoskeletal:     Cervical back: Normal range of motion.     Right lower leg: No edema.     Left lower leg: No edema.  Lymphadenopathy:     Cervical: No cervical adenopathy.     Upper Body:     Right upper body: No supraclavicular adenopathy.     Left upper body: No supraclavicular adenopathy.  Skin:    General: Skin is warm and dry.  Neurological:     General: No focal deficit present.     Mental Status: She is alert and oriented to person, place, and time. Mental status is at baseline.     Sensory: Sensation is intact.     Motor: Motor function is intact. No weakness.     Deep Tendon Reflexes: Reflexes are normal and symmetric.  Psychiatric:  Attention and Perception: Attention normal.        Mood and Affect: Mood normal.        Speech: Speech normal.        Behavior: Behavior normal.        Thought Content: Thought content normal.        Cognition and Memory: Cognition normal.        Judgment: Judgment normal.    Most Recent Functional Status Assessment:    12/05/2023    9:49 AM  In your present state of health, do you have any difficulty performing the following activities:  Hearing? 0  Vision? 0  Difficulty concentrating or making decisions? 0  Walking or climbing stairs? 0  Dressing or bathing? 0  Doing errands, shopping? 0  Preparing Food and eating ? N  Using the Toilet? N  In the past six months, have you accidently leaked urine? N  Do you have problems with loss of bowel control? N  Managing your Medications? N  Managing your Finances? N  Housekeeping or managing your Housekeeping? N   Most Recent Fall Risk Assessment:    12/05/2023    9:49 AM  Fall Risk   Falls in the past year? 0  Number falls in past yr: 0  Injury with Fall? 0  Risk for fall due to : No Fall Risks  Follow up Falls prevention discussed;Education provided;Falls evaluation completed   Most Recent Depression Screenings:    12/05/2023    9:50 AM 11/25/2022     9:46 AM  PHQ 2/9 Scores  PHQ - 2 Score 0 0   Most Recent Cognitive Screening:    12/05/2023   10:07 AM  6CIT Screen  What Year? 0 points  What month? 0 points  What time? 0 points  Count back from 20 0 points  Months in reverse 0 points  Repeat phrase 0 points  Total Score 0 points   Results:  Studies Obtained And Personally Reviewed By Me: Mammogram 02/03/2023 normal with repeat recommendation of 2025.  Colonoscopy 05/03/2018 with 2 total Polyps removed - 2 mm sessile from ascending colon & 4 mm sessile from sigmoid. Says that when she went to schedule her colonoscopy she was told that due to her age it is recommended that she does not have a repeat colonoscopy, so these have been discontinued.   Overdue for Bone Density -- ordered today. Labs:     Component Value Date/Time   NA 140 12/02/2023 1215   NA 140 09/28/2017 0811   K 5.9 (H) 12/02/2023 1215   K 3.8 09/28/2017 0811   CL 103 12/02/2023 1215   CL 105 01/30/2013 0825   CO2 26 12/02/2023 1215   CO2 25 09/28/2017 0811   GLUCOSE 86 12/02/2023 1215   GLUCOSE 88 09/28/2017 0811   GLUCOSE 88 01/30/2013 0825   BUN 20 12/02/2023 1215   BUN 14.6 09/28/2017 0811   CREATININE 1.00 12/02/2023 1215   CREATININE 0.9 09/28/2017 0811   CALCIUM 10.3 12/02/2023 1215   CALCIUM 9.1 09/28/2017 0811   PROT 7.7 12/02/2023 1215   PROT 7.0 09/28/2017 0811   ALBUMIN 4.5 11/16/2023 1005   ALBUMIN 4.2 09/28/2017 0811   AST 19 12/02/2023 1215   AST 18 11/16/2023 1005   AST 23 09/28/2017 0811   ALT 12 12/02/2023 1215   ALT 12 11/16/2023 1005   ALT 23 09/28/2017 0811   ALKPHOS 78 11/16/2023 1005   ALKPHOS 75 09/28/2017 0811   BILITOT 0.7  12/02/2023 1215   BILITOT 0.7 11/16/2023 1005   BILITOT 0.46 09/28/2017 0811   GFRNONAA >60 11/16/2023 1005   GFRNONAA 46 (L) 11/18/2020 0911   GFRAA 54 (L) 11/18/2020 0911    Lab Results  Component Value Date   WBC 6.5 11/16/2023   HGB 15.9 (H) 11/16/2023   HCT 45.3 11/16/2023   MCV 109.7  (H) 11/16/2023   PLT 669 (H) 11/16/2023   Lab Results  Component Value Date   CHOL 248 (H) 12/02/2023   HDL 87 12/02/2023   LDLCALC 135 (H) 12/02/2023   TRIG 137 12/02/2023   CHOLHDL 2.9 12/02/2023   Lab Results  Component Value Date   TSH 3.05 12/02/2023    Assessment & Plan:   Orders Placed This Encounter  Procedures   POCT URINALYSIS DIP (CLINITEK)  Other Labs Reviewed today: CMP, compared to 11/16/2023 from [facility]: eGFR 57, down from > 60; Potassium 5.9, elevated from 4.0; otherwise WNL.  TSH: 3.05  Polymyalgia: was placed on Medrol tapering course 08/30/2023 and started 2 mg Deltasone daily after completing tapering.   Essential Thrombocytosis JAK2 Myeloproliferative Disorder treated with Hydrea 1500 mg daily. Followed by Oncologist, Dr. Wyvonnia Lora. 11/16/2023 CBC from [facility], compared to 10/05/23: Hgb 15.9, decreased from 16.7; MCV 109.7, decreased slightly from 110; MCH 38.5, decreased slightly from 39; Platelets 669, decreased significantly from 901. Says that when her platelets had raised to 901 Dr. Candise Che instructed her to increase her Hydrea for 6 weeks, and her count decreased to 669; according to her, Dr. Candise Che believes that she is developing polycythemia.    History of Bilateral Shoulder & Knee Pain; Arthritis treated with Ultram 50 mg every 6 hours as needed.   Mixed Hyperlipidemia 12/02/2023 Lipid Panel, compared to 11/23/2022: Cholesterol 248, elevated from 244; LDL 135, decreased slightly from 138 but still elevated. Discussed starting a low-dose statin, which she is agreeable to doing to to mitigate further risk factors w/ her hx of essential thrombocytosis.   Vitamin-D Deficiency treated with Vitamin D 2,000 units daily.   Mammogram 02/03/2023 normal with repeat recommendation of 2025.  Colonoscopy 05/03/2018 with 2 total Polyps removed - 2 mm sessile from ascending colon & 4 mm sessile from sigmoid. Says that when she went to schedule her colonoscopy she  was told that due to her age it is recommended that she does not have a repeat colonoscopy, so these have been discontinued.   Overdue for Bone Density -- ordered today.  Vaccine Counseling: UTD on Flu, Shingles 2/2, and PNA.    Annual wellness visit done today including the all of the following: Reviewed patient's Family Medical History Reviewed and updated list of patient's medical providers Assessment of cognitive impairment was done Assessed patient's functional ability Established a written schedule for health screening services Health Risk Assessent Completed and Reviewed  Discussed health benefits of physical activity, and encouraged her to engage in regular exercise appropriate for her age and condition.    I,Emily Lagle,acting as a Neurosurgeon for Margaree Mackintosh, MD.,have documented all relevant documentation on the behalf of Margaree Mackintosh, MD,as directed by  Margaree Mackintosh, MD while in the presence of Margaree Mackintosh, MD.   I, Margaree Mackintosh, MD, have reviewed all documentation for this visit. The documentation on 12/18/23 for the exam, diagnosis, procedures, and orders are all accurate and complete.

## 2023-12-02 ENCOUNTER — Other Ambulatory Visit: Payer: Medicare Other

## 2023-12-02 DIAGNOSIS — E782 Mixed hyperlipidemia: Secondary | ICD-10-CM

## 2023-12-02 DIAGNOSIS — D473 Essential (hemorrhagic) thrombocythemia: Secondary | ICD-10-CM | POA: Diagnosis not present

## 2023-12-02 DIAGNOSIS — Z0000 Encounter for general adult medical examination without abnormal findings: Secondary | ICD-10-CM

## 2023-12-02 DIAGNOSIS — Z1329 Encounter for screening for other suspected endocrine disorder: Secondary | ICD-10-CM | POA: Diagnosis not present

## 2023-12-02 DIAGNOSIS — Z780 Asymptomatic menopausal state: Secondary | ICD-10-CM | POA: Diagnosis not present

## 2023-12-03 LAB — COMPLETE METABOLIC PANEL WITH GFR
AG Ratio: 1.8 (calc) (ref 1.0–2.5)
ALT: 12 U/L (ref 6–29)
AST: 19 U/L (ref 10–35)
Albumin: 4.9 g/dL (ref 3.6–5.1)
Alkaline phosphatase (APISO): 88 U/L (ref 37–153)
BUN: 20 mg/dL (ref 7–25)
CO2: 26 mmol/L (ref 20–32)
Calcium: 10.3 mg/dL (ref 8.6–10.4)
Chloride: 103 mmol/L (ref 98–110)
Creat: 1 mg/dL (ref 0.60–1.00)
Globulin: 2.8 g/dL (ref 1.9–3.7)
Glucose, Bld: 86 mg/dL (ref 65–99)
Potassium: 5.9 mmol/L — ABNORMAL HIGH (ref 3.5–5.3)
Sodium: 140 mmol/L (ref 135–146)
Total Bilirubin: 0.7 mg/dL (ref 0.2–1.2)
Total Protein: 7.7 g/dL (ref 6.1–8.1)
eGFR: 57 mL/min/{1.73_m2} — ABNORMAL LOW (ref >=60)

## 2023-12-03 LAB — LIPID PANEL
Cholesterol: 248 mg/dL — ABNORMAL HIGH (ref <200)
HDL: 87 mg/dL (ref >=50)
LDL Cholesterol (Calc): 135 mg/dL — ABNORMAL HIGH
Non-HDL Cholesterol (Calc): 161 mg/dL — ABNORMAL HIGH (ref <130)
Total CHOL/HDL Ratio: 2.9 (calc) (ref <5.0)
Triglycerides: 137 mg/dL (ref <150)

## 2023-12-03 LAB — TSH: TSH: 3.05 m[IU]/L (ref 0.40–4.50)

## 2023-12-05 ENCOUNTER — Encounter: Payer: Self-pay | Admitting: Internal Medicine

## 2023-12-05 ENCOUNTER — Ambulatory Visit (INDEPENDENT_AMBULATORY_CARE_PROVIDER_SITE_OTHER): Payer: Medicare Other | Admitting: Internal Medicine

## 2023-12-05 VITALS — BP 130/80 | HR 70 | Wt 128.0 lb

## 2023-12-05 DIAGNOSIS — Z8639 Personal history of other endocrine, nutritional and metabolic disease: Secondary | ICD-10-CM | POA: Diagnosis not present

## 2023-12-05 DIAGNOSIS — M810 Age-related osteoporosis without current pathological fracture: Secondary | ICD-10-CM | POA: Diagnosis not present

## 2023-12-05 DIAGNOSIS — Z9079 Acquired absence of other genital organ(s): Secondary | ICD-10-CM

## 2023-12-05 DIAGNOSIS — E782 Mixed hyperlipidemia: Secondary | ICD-10-CM

## 2023-12-05 DIAGNOSIS — Z1589 Genetic susceptibility to other disease: Secondary | ICD-10-CM

## 2023-12-05 DIAGNOSIS — D473 Essential (hemorrhagic) thrombocythemia: Secondary | ICD-10-CM

## 2023-12-05 DIAGNOSIS — Z13820 Encounter for screening for osteoporosis: Secondary | ICD-10-CM | POA: Diagnosis not present

## 2023-12-05 DIAGNOSIS — Z90722 Acquired absence of ovaries, bilateral: Secondary | ICD-10-CM

## 2023-12-05 DIAGNOSIS — Z0000 Encounter for general adult medical examination without abnormal findings: Secondary | ICD-10-CM

## 2023-12-05 DIAGNOSIS — Z90710 Acquired absence of both cervix and uterus: Secondary | ICD-10-CM | POA: Diagnosis not present

## 2023-12-05 LAB — POCT URINALYSIS DIP (CLINITEK)
Bilirubin, UA: NEGATIVE
Blood, UA: NEGATIVE
Glucose, UA: NEGATIVE mg/dL
Ketones, POC UA: NEGATIVE mg/dL
Leukocytes, UA: NEGATIVE
Nitrite, UA: NEGATIVE
POC PROTEIN,UA: NEGATIVE
Spec Grav, UA: 1.015 (ref 1.010–1.025)
Urobilinogen, UA: 0.2 U/dL
pH, UA: 6.5 (ref 5.0–8.0)

## 2023-12-05 MED ORDER — ROSUVASTATIN CALCIUM 5 MG PO TABS
5.0000 mg | ORAL_TABLET | Freq: Every day | ORAL | 3 refills | Status: AC
Start: 2023-12-05 — End: ?

## 2023-12-18 ENCOUNTER — Encounter: Payer: Self-pay | Admitting: Internal Medicine

## 2023-12-18 NOTE — Patient Instructions (Addendum)
 Continue close follow up with Oncology.  Vaccines reviewed. Take Ultram for pain as needed. Mammogram due in May. Please call for appointment for mammogram. Return here in one year or as needed. As always, it was a pleasure to see you today.

## 2023-12-21 ENCOUNTER — Other Ambulatory Visit: Payer: Self-pay | Admitting: Internal Medicine

## 2023-12-21 DIAGNOSIS — Z1231 Encounter for screening mammogram for malignant neoplasm of breast: Secondary | ICD-10-CM

## 2024-01-16 ENCOUNTER — Other Ambulatory Visit: Payer: Self-pay

## 2024-01-16 DIAGNOSIS — D751 Secondary polycythemia: Secondary | ICD-10-CM

## 2024-01-16 DIAGNOSIS — D473 Essential (hemorrhagic) thrombocythemia: Secondary | ICD-10-CM

## 2024-01-17 ENCOUNTER — Inpatient Hospital Stay: Payer: Medicare Other | Attending: Oncology

## 2024-01-17 ENCOUNTER — Inpatient Hospital Stay (HOSPITAL_BASED_OUTPATIENT_CLINIC_OR_DEPARTMENT_OTHER): Payer: Medicare Other | Admitting: Hematology

## 2024-01-17 VITALS — BP 140/55 | HR 69 | Temp 97.7°F | Resp 16 | Ht 62.0 in | Wt 128.4 lb

## 2024-01-17 DIAGNOSIS — Z83719 Family history of colon polyps, unspecified: Secondary | ICD-10-CM | POA: Diagnosis not present

## 2024-01-17 DIAGNOSIS — R5383 Other fatigue: Secondary | ICD-10-CM | POA: Insufficient documentation

## 2024-01-17 DIAGNOSIS — E785 Hyperlipidemia, unspecified: Secondary | ICD-10-CM | POA: Insufficient documentation

## 2024-01-17 DIAGNOSIS — Z90710 Acquired absence of both cervix and uterus: Secondary | ICD-10-CM | POA: Diagnosis not present

## 2024-01-17 DIAGNOSIS — Z79899 Other long term (current) drug therapy: Secondary | ICD-10-CM | POA: Diagnosis not present

## 2024-01-17 DIAGNOSIS — D471 Chronic myeloproliferative disease: Secondary | ICD-10-CM | POA: Diagnosis not present

## 2024-01-17 DIAGNOSIS — Z803 Family history of malignant neoplasm of breast: Secondary | ICD-10-CM | POA: Diagnosis not present

## 2024-01-17 DIAGNOSIS — Z7964 Long term (current) use of myelosuppressive agent: Secondary | ICD-10-CM | POA: Diagnosis not present

## 2024-01-17 DIAGNOSIS — Z885 Allergy status to narcotic agent status: Secondary | ICD-10-CM | POA: Diagnosis not present

## 2024-01-17 DIAGNOSIS — Z87891 Personal history of nicotine dependence: Secondary | ICD-10-CM | POA: Insufficient documentation

## 2024-01-17 DIAGNOSIS — Z7982 Long term (current) use of aspirin: Secondary | ICD-10-CM | POA: Diagnosis not present

## 2024-01-17 DIAGNOSIS — M353 Polymyalgia rheumatica: Secondary | ICD-10-CM | POA: Insufficient documentation

## 2024-01-17 DIAGNOSIS — Z808 Family history of malignant neoplasm of other organs or systems: Secondary | ICD-10-CM | POA: Diagnosis not present

## 2024-01-17 DIAGNOSIS — Z9049 Acquired absence of other specified parts of digestive tract: Secondary | ICD-10-CM | POA: Diagnosis not present

## 2024-01-17 DIAGNOSIS — Z825 Family history of asthma and other chronic lower respiratory diseases: Secondary | ICD-10-CM | POA: Diagnosis not present

## 2024-01-17 DIAGNOSIS — M542 Cervicalgia: Secondary | ICD-10-CM | POA: Diagnosis not present

## 2024-01-17 DIAGNOSIS — R062 Wheezing: Secondary | ICD-10-CM | POA: Insufficient documentation

## 2024-01-17 DIAGNOSIS — Z888 Allergy status to other drugs, medicaments and biological substances status: Secondary | ICD-10-CM | POA: Insufficient documentation

## 2024-01-17 DIAGNOSIS — M1712 Unilateral primary osteoarthritis, left knee: Secondary | ICD-10-CM | POA: Insufficient documentation

## 2024-01-17 DIAGNOSIS — Z833 Family history of diabetes mellitus: Secondary | ICD-10-CM | POA: Insufficient documentation

## 2024-01-17 DIAGNOSIS — Z880 Allergy status to penicillin: Secondary | ICD-10-CM | POA: Diagnosis not present

## 2024-01-17 DIAGNOSIS — D473 Essential (hemorrhagic) thrombocythemia: Secondary | ICD-10-CM

## 2024-01-17 DIAGNOSIS — M25559 Pain in unspecified hip: Secondary | ICD-10-CM | POA: Insufficient documentation

## 2024-01-17 DIAGNOSIS — D751 Secondary polycythemia: Secondary | ICD-10-CM

## 2024-01-17 DIAGNOSIS — M898X9 Other specified disorders of bone, unspecified site: Secondary | ICD-10-CM | POA: Insufficient documentation

## 2024-01-17 LAB — CBC WITH DIFFERENTIAL (CANCER CENTER ONLY)
Abs Immature Granulocytes: 0.04 10*3/uL (ref 0.00–0.07)
Basophils Absolute: 0.1 10*3/uL (ref 0.0–0.1)
Basophils Relative: 1 %
Eosinophils Absolute: 0.1 10*3/uL (ref 0.0–0.5)
Eosinophils Relative: 1 %
HCT: 43.2 % (ref 36.0–46.0)
Hemoglobin: 15.2 g/dL — ABNORMAL HIGH (ref 12.0–15.0)
Immature Granulocytes: 1 %
Lymphocytes Relative: 18 %
Lymphs Abs: 1.3 10*3/uL (ref 0.7–4.0)
MCH: 39.4 pg — ABNORMAL HIGH (ref 26.0–34.0)
MCHC: 35.2 g/dL (ref 30.0–36.0)
MCV: 111.9 fL — ABNORMAL HIGH (ref 80.0–100.0)
Monocytes Absolute: 0.6 10*3/uL (ref 0.1–1.0)
Monocytes Relative: 9 %
Neutro Abs: 4.9 10*3/uL (ref 1.7–7.7)
Neutrophils Relative %: 70 %
Platelet Count: 600 10*3/uL — ABNORMAL HIGH (ref 150–400)
RBC: 3.86 MIL/uL — ABNORMAL LOW (ref 3.87–5.11)
RDW: 14.9 % (ref 11.5–15.5)
WBC Count: 7 10*3/uL (ref 4.0–10.5)
nRBC: 0 % (ref 0.0–0.2)

## 2024-01-17 LAB — CMP (CANCER CENTER ONLY)
ALT: 12 U/L (ref 0–44)
AST: 20 U/L (ref 15–41)
Albumin: 4.8 g/dL (ref 3.5–5.0)
Alkaline Phosphatase: 80 U/L (ref 38–126)
Anion gap: 8 (ref 5–15)
BUN: 20 mg/dL (ref 8–23)
CO2: 28 mmol/L (ref 22–32)
Calcium: 9.7 mg/dL (ref 8.9–10.3)
Chloride: 104 mmol/L (ref 98–111)
Creatinine: 1.14 mg/dL — ABNORMAL HIGH (ref 0.44–1.00)
GFR, Estimated: 49 mL/min — ABNORMAL LOW (ref >60)
Glucose, Bld: 92 mg/dL (ref 70–99)
Potassium: 4.3 mmol/L (ref 3.5–5.1)
Sodium: 140 mmol/L (ref 135–145)
Total Bilirubin: 0.6 mg/dL (ref 0.0–1.2)
Total Protein: 7.3 g/dL (ref 6.5–8.1)

## 2024-01-17 LAB — VITAMIN B12: Vitamin B-12: 627 pg/mL (ref 180–914)

## 2024-01-17 NOTE — Progress Notes (Signed)
 HEMATOLOGY/ONCOLOGY CLINIC NOTE  Date of Service: 01/17/24  Patient Care Team: Sylvan Evener, MD as PCP - General (Internal Medicine) Daryel Ensign, DO as Consulting Physician (Neurology)  CHIEF COMPLAINTS/PURPOSE OF CONSULTATION:  F/u for continued evaluation and mx of Essential thrombocytosis   Prior Therapy:    Anagrelide initiated briefly and discontinued due to do intolerance 2008.   High-dose prednisone  due to polymyalgia rheumatica started in December 2022.  She was tapered off to 1 mg daily and was discontinued in May 2023.  HISTORY OF PRESENTING ILLNESS:  Anna Fuller is a wonderful 78 y.o. female who is here for continued evaluation and management of Essential thrombocytosis. Patient has been transferred to us  by Dr. Dirk Fredericks. Patient was diagnosed with JAK-2 positive myeloproliferative disorder in 2007. Essential thrombocythemia was confirmed in 2008 by bone marrow biopsy. Patient was also diagnosed with polymyalgia rheumatica in November 2022.   Patient was last seen by Dr. Dirk Fredericks on 09/30/2022 and she complained of left knee pain and mild myalgias. Patient's current treatment includes Hydroxyurea  1500 mg daily which was started in 2009.   Patient reports she has been doing well overall without any new medical concerns since her last visit with Dr. Dirk Fredericks. Patient notes she was having severe bone pain last year and she had bone marrow biopsy, but denies bone density study. Patient was started on prednisone  last year for around 6 months when she had severe bone pain.   She notes she has discontinued Simvastatin  40 mg last year after she had severe bone pain. She is now taking Rosuvastatin  5 mg. Patient notes her bone pain is still there with Rosuvastatin , but not as severe.   She does take multi-vitamin supplements and Vitamin-D 1,000 mg every other day. She denies taking Vitamin B-12 supplement.   She denies infection issues, fever, chills, night sweats, infection  issues, abdominal pain, chest pain, back pain, or leg swelling. She denies any past medical history of blood clots. Patient reports no injuries to her spleen.  During this visit, patient still complains of consistent bone pain, but not as severe as last year. She notes her pain is primarily near her neck and shoulder. She has been evaluated by rheumatologist. She takes tramadol  for pain management.   Patient does have osteoarthritis which is getting evaluated and managed by her orthopedic physician. Her osteoarthritis was diagnosed due to left knee pain.   Patient has received her influenza vaccine, COVID-19 Booster, Pneumonia vaccine, and Shingles vaccine. She denies RSV vaccine.   INTERVAL HISTORY:  Anna Fuller is a wonderful 78 y.o. female who is here for continued evaluation and management of Essential thrombocytosis.  Patient was last seen by me on 11/16/2023 and she complained of mild abdominal discomfort on palpation, mild shoulder/hip pain, and mild fatigue and rippled fingernails due to increased Hydroxyurea  dosage.   Patient notes she has been doing well overall since our last visit. She complains of occasional sudden wheezing/gasping, which is not severe. She denies SOB.   She denies any new infection issues, fever, chills, night sweats, unexpected weight loss, back pain, chest pain, abdominal pain, or leg swelling.   Patient currently takes three Hydroxyurea  tablets, 1500 mg total, daily. She has been tolerating this current hydroxyurea  dosage well without any new or severe toxicities. She denies fatigue/lethargy with current Hydroxyurea  dose of 1500 mg daily.    MEDICAL HISTORY:  Past Medical History:  Diagnosis Date   Cancer [facility])    essential thrombocythemia  Hyperlipidemia    Macular degeneration    Neuromuscular disorder (HCC)    chemo induced neuropathy   Polycythemia    on the line   Thrombocytosis     SURGICAL HISTORY: Past Surgical History:  Procedure  Laterality Date   ABDOMINAL HYSTERECTOMY     CHOLECYSTECTOMY     COLONOSCOPY     extra bones removed     on feet, knees and wrist   POLYPECTOMY     TONSILLECTOMY     TUMOR EXCISION     left leg    SOCIAL HISTORY: Social History   Socioeconomic History   Marital status: Single    Spouse name: Not on file   Number of children: Not on file   Years of education: Not on file   Highest education level: Some college, no degree  Occupational History   Not on file  Tobacco Use   Smoking status: Former    Current packs/day: 0.00    Types: Cigarettes    Quit date: 08/18/2002    Years since quitting: 21.4   Smokeless tobacco: Never  Vaping Use   Vaping status: Never Used  Substance and Sexual Activity   Alcohol use: Yes    Alcohol/week: 0.0 standard drinks of alcohol    Comment: rare   Drug use: No   Sexual activity: Never  Other Topics Concern   Not on file  Social History Narrative   Right handed    Lives alone   Social Drivers of Health   Financial Resource Strain: Low Risk  (11/30/2023)   Overall Financial Resource Strain (CARDIA)    Difficulty of Paying Living Expenses: Not hard at all  Food Insecurity: No Food Insecurity (11/30/2023)   Hunger Vital Sign    Worried About Running Out of Food in the Last Year: Never true    Ran Out of Food in the Last Year: Never true  Transportation Needs: No Transportation Needs (11/30/2023)   PRAPARE - Administrator, Civil Service (Medical): No    Lack of Transportation (Non-Medical): No  Physical Activity: Patient Declined (11/30/2023)   Exercise Vital Sign    Days of Exercise per Week: Patient declined    Minutes of Exercise per Session: Patient declined  Stress: No Stress Concern Present (11/30/2023)   [employer] of Occupational Health - Occupational Stress Questionnaire    Feeling of Stress : Not at all  Social Connections: Moderately Integrated (11/30/2023)   Social Connection and Isolation Panel [NHANES]     Frequency of Communication with Friends and Family: More than three times a week    Frequency of Social Gatherings with Friends and Family: Three times a week    Attends Religious Services: More than 4 times per year    Active Member of Clubs or Organizations: Yes    Attends Banker Meetings: More than 4 times per year    Marital Status: Never married  Intimate Partner Violence: Not At Risk (12/05/2023)   Humiliation, Afraid, Rape, and Kick questionnaire    Fear of Current or Ex-Partner: No    Emotionally Abused: No    Physically Abused: No    Sexually Abused: No    FAMILY HISTORY: Family History  Problem Relation Age of Onset   Pulmonary fibrosis Mother    Colon polyps Mother    Diabetes Father    Breast cancer Sister    Esophageal cancer Maternal Uncle    Pancreatic cancer Maternal Aunt    Colon cancer  Neg Hx    Stomach cancer Neg Hx    Rectal cancer Neg Hx     ALLERGIES:  is allergic to codeine, iodine, and penicillins.  MEDICATIONS:  Current Outpatient Medications  Medication Sig Dispense Refill   aspirin 81 MG tablet Take 81 mg by mouth daily.     cholecalciferol (VITAMIN D ) 1000 UNITS tablet Take 2,000 Units by mouth daily.     hydroxyurea  (HYDREA ) 500 MG capsule TAKE 3 CAPSULES BY MOUTH EVERY DAY 270 capsule 4   Multiple Vitamins-Minerals (ICAPS AREDS 2 PO) Take by mouth.     ondansetron  (ZOFRAN ) 4 MG tablet Take 1 tablet (4 mg total) by mouth every 8 (eight) hours as needed for nausea or vomiting. 20 tablet 1   rosuvastatin  (CRESTOR ) 5 MG tablet Take 1 tablet (5 mg total) by mouth daily. 90 tablet 3   No current facility-administered medications for this visit.    REVIEW OF SYSTEMS:    10 Point review of Systems was done is negative except as noted above.   PHYSICAL EXAMINATION: ECOG PERFORMANCE STATUS: 1 - Symptomatic but completely ambulatory  . Vitals:   01/17/24 0855  BP: (!) 140/55  Pulse: 69  Resp: 16  Temp: 97.7 F (36.5 C)  SpO2:  98%    Filed Weights   01/17/24 0855  Weight: 128 lb 6.4 oz (58.2 kg)   .Body mass index is 23.48 kg/m.   GENERAL:alert, in no acute distress and comfortable SKIN: no acute rashes, no significant lesions EYES: conjunctiva are pink and non-injected, sclera anicteric OROPHARYNX: MMM, no exudates, no oropharyngeal erythema or ulceration NECK: supple, no JVD LYMPH:  no palpable lymphadenopathy in the cervical, axillary or inguinal regions LUNGS: clear to auscultation b/l with normal respiratory effort HEART: regular rate & rhythm ABDOMEN:  normoactive bowel sounds , non tender, not distended. Extremity: no pedal edema PSYCH: alert & oriented x 3 with fluent speech NEURO: no focal motor/sensory deficits    LABORATORY DATA:  I have reviewed the data as listed .    Latest Ref Rng & Units 01/17/2024    8:16 AM 11/16/2023   10:05 AM 10/05/2023    7:39 AM  CBC  WBC 4.0 - 10.5 K/uL 7.0  6.5  8.7   Hemoglobin 12.0 - 15.0 g/dL 29.5  62.1  30.8   Hematocrit 36.0 - 46.0 % 43.2  45.3  47.1   Platelets 150 - 400 K/uL 600  669  901    .    Latest Ref Rng & Units 01/17/2024    8:16 AM 12/02/2023   12:15 PM 11/16/2023   10:05 AM  CMP  Glucose 70 - 99 mg/dL 92  86  89   BUN 8 - 23 mg/dL 20  20  19    Creatinine 0.44 - 1.00 mg/dL 6.57  8.46  9.62   Sodium 135 - 145 mmol/L 140  140  139   Potassium 3.5 - 5.1 mmol/L 4.3  5.9  4.0   Chloride 98 - 111 mmol/L 104  103  105   CO2 22 - 32 mmol/L 28  26  27    Calcium  8.9 - 10.3 mg/dL 9.7  95.2  9.3   Total Protein 6.5 - 8.1 g/dL 7.3  7.7  6.7   Total Bilirubin 0.0 - 1.2 mg/dL 0.6  0.7  0.7   Alkaline Phos 38 - 126 U/L 80   78   AST 15 - 41 U/L 20  19  18    ALT  0 - 44 U/L 12  12  12      RADIOGRAPHIC STUDIES: I have personally reviewed the radiological images as listed and agreed with the findings in the report. No results found.  ASSESSMENT & PLAN:   78 year old woman with:  1. Myeloproliferative disorder diagnosed in 2007.  She was  found to have JAK2 positive mutation, elevated platelet count confirmed by bone marrow biopsy. She is currently on hydroxyurea  with reasonable control of her platelet count.  2.  Polymyalgia rheumatica: unclear if this is related to her myeloproliferative disorder.  She is off prednisone  at this time without any recent reactivation.   3.  Thrombosis prophylaxis: Risk of thrombosis remains low at this time.  Her white cell count is normal and has not had any thrombosis episodes.  PLAN: -Discussed lab results from today, 01/17/2024, in detail with the patient. CBC shows elevated Hgb of 15.2 g/dL with normal Hct of 16.1% and elevated platelets of 600 K. CMP shows elevated creatinine of 1.14. -Patient has been tolerating her current Hydroxyurea  dosage well without any new or severe toxicities.  -Continue Hydroxyurea  current dose of three tablets, 1500 mg total, daily.  -Answered all of patient's questions.    FOLLOW-UP: RTC with Dr Salomon Cree with labs in 2 months  The total time spent in the appointment was 20 minutes* .  All of the patient's questions were answered with apparent satisfaction. The patient knows to call the clinic with any problems, questions or concerns.   Jacquelyn Matt MD MS AAHIVMS [facility] Hematology/Oncology Physician [facility]  .*Total Encounter Time as defined by the Centers for Medicare and Medicaid Services includes, in addition to the face-to-face time of a patient visit (documented in the note above) non-face-to-face time: obtaining and reviewing outside history, ordering and reviewing medications, tests or procedures, care coordination (communications with other health care professionals or caregivers) and documentation in the medical record.   I,Param Shah,acting as a Neurosurgeon for Jacquelyn Matt, MD.,have documented all relevant documentation on the behalf of Jacquelyn Matt, MD,as directed by  Jacquelyn Matt, MD while in the presence of Jacquelyn Matt, MD.  .I have  reviewed the above documentation for accuracy and completeness, and I agree with the above. .Khai Arrona Kishore Alashia Brownfield MD

## 2024-01-19 DIAGNOSIS — Z23 Encounter for immunization: Secondary | ICD-10-CM | POA: Diagnosis not present

## 2024-02-06 ENCOUNTER — Ambulatory Visit
Admission: RE | Admit: 2024-02-06 | Discharge: 2024-02-06 | Disposition: A | Discharge status: Home / Self Care | Source: Ambulatory Visit | Attending: Internal Medicine | Admitting: Internal Medicine

## 2024-02-06 DIAGNOSIS — Z1231 Encounter for screening mammogram for malignant neoplasm of breast: Secondary | ICD-10-CM

## 2024-03-16 ENCOUNTER — Other Ambulatory Visit: Payer: Self-pay

## 2024-03-16 DIAGNOSIS — D473 Essential (hemorrhagic) thrombocythemia: Secondary | ICD-10-CM

## 2024-03-18 NOTE — Progress Notes (Signed)
 HEMATOLOGY/ONCOLOGY CLINIC NOTE  Date of Service: 03/19/2024  Patient Care Team: Perri Ronal PARAS, MD as PCP - General (Internal Medicine) Tobie Tonita POUR, DO as Consulting Physician (Neurology)  CHIEF COMPLAINTS/PURPOSE OF CONSULTATION:  F/u for continued evaluation and mx of Essential thrombocytosis   Prior Therapy:    Anagrelide initiated briefly and discontinued due to do intolerance 2008.   High-dose prednisone  due to polymyalgia rheumatica started in December 2022.  She was tapered off to 1 mg daily and was discontinued in May 2023.  HISTORY OF PRESENTING ILLNESS:  Anna Fuller is a wonderful 78 y.o. female who is here for continued evaluation and management of Essential thrombocytosis. Patient has been transferred to us  by Dr. Amadeo. Patient was diagnosed with JAK-2 positive myeloproliferative disorder in 2007. Essential thrombocythemia was confirmed in 2008 by bone marrow biopsy. Patient was also diagnosed with polymyalgia rheumatica in November 2022.   Patient was last seen by Dr. Amadeo on 09/30/2022 and she complained of left knee pain and mild myalgias. Patient's current treatment includes Hydroxyurea  1500 mg daily which was started in 2009.   Patient reports she has been doing well overall without any new medical concerns since her last visit with Dr. Amadeo. Patient notes she was having severe bone pain last year and she had bone marrow biopsy, but denies bone density study. Patient was started on prednisone  last year for around 6 months when she had severe bone pain.   She notes she has discontinued Simvastatin  40 mg last year after she had severe bone pain. She is now taking Rosuvastatin  5 mg. Patient notes her bone pain is still there with Rosuvastatin , but not as severe.   She does take multi-vitamin supplements and Vitamin-D 1,000 mg every other day. She denies taking Vitamin B-12 supplement.   She denies infection issues, fever, chills, night sweats, infection  issues, abdominal pain, chest pain, back pain, or leg swelling. She denies any past medical history of blood clots. Patient reports no injuries to her spleen.  During this visit, patient still complains of consistent bone pain, but not as severe as last year. She notes her pain is primarily near her neck and shoulder. She has been evaluated by rheumatologist. She takes tramadol  for pain management.   Patient does have osteoarthritis which is getting evaluated and managed by her orthopedic physician. Her osteoarthritis was diagnosed due to left knee pain.   Patient has received her influenza vaccine, COVID-19 Booster, Pneumonia vaccine, and Shingles vaccine. She denies RSV vaccine.   INTERVAL HISTORY:  Anna Fuller is a wonderful 78 y.o. female who is here for continued evaluation and management of Essential thrombocytosis.  Patient was last seen by me on 01/17/2024.  Patient notes no acute new concerns since her last clinic visit.  Tolerating hydroxyurea  well without any acute issues.  No infection issues.  No bleeding.  No new fatigue. Patient notes that she had her annual visit with her PCP recently. Labs done were reviewed in details.   MEDICAL HISTORY:  Past Medical History:  Diagnosis Date   Cancer [facility])    essential thrombocythemia   Hyperlipidemia    Macular degeneration    Neuromuscular disorder (HCC)    chemo induced neuropathy   Polycythemia    on the line   Thrombocytosis     SURGICAL HISTORY: Past Surgical History:  Procedure Laterality Date   ABDOMINAL HYSTERECTOMY     CHOLECYSTECTOMY     COLONOSCOPY     extra bones  removed     on feet, knees and wrist   POLYPECTOMY     TONSILLECTOMY     TUMOR EXCISION     left leg    SOCIAL HISTORY: Social History   Socioeconomic History   Marital status: Single    Spouse name: Not on file   Number of children: Not on file   Years of education: Not on file   Highest education level: Some college, no degree   Occupational History   Not on file  Tobacco Use   Smoking status: Former    Current packs/day: 0.00    Types: Cigarettes    Quit date: 08/18/2002    Years since quitting: 21.6   Smokeless tobacco: Never  Vaping Use   Vaping status: Never Used  Substance and Sexual Activity   Alcohol use: Yes    Alcohol/week: 0.0 standard drinks of alcohol    Comment: rare   Drug use: No   Sexual activity: Never  Other Topics Concern   Not on file  Social History Narrative   Right handed    Lives alone   Social Drivers of Health   Financial Resource Strain: Low Risk  (11/30/2023)   Overall Financial Resource Strain (CARDIA)    Difficulty of Paying Living Expenses: Not hard at all  Food Insecurity: No Food Insecurity (11/30/2023)   Hunger Vital Sign    Worried About Running Out of Food in the Last Year: Never true    Ran Out of Food in the Last Year: Never true  Transportation Needs: No Transportation Needs (11/30/2023)   PRAPARE - Administrator, Civil Service (Medical): No    Lack of Transportation (Non-Medical): No  Physical Activity: Patient Declined (11/30/2023)   Exercise Vital Sign    Days of Exercise per Week: Patient declined    Minutes of Exercise per Session: Patient declined  Stress: No Stress Concern Present (11/30/2023)   [employer] of Occupational Health - Occupational Stress Questionnaire    Feeling of Stress : Not at all  Social Connections: Moderately Integrated (11/30/2023)   Social Connection and Isolation Panel    Frequency of Communication with Friends and Family: More than three times a week    Frequency of Social Gatherings with Friends and Family: Three times a week    Attends Religious Services: More than 4 times per year    Active Member of Clubs or Organizations: Yes    Attends Banker Meetings: More than 4 times per year    Marital Status: Never married  Intimate Partner Violence: Not At Risk (12/05/2023)   Humiliation, Afraid, Rape,  and Kick questionnaire    Fear of Current or Ex-Partner: No    Emotionally Abused: No    Physically Abused: No    Sexually Abused: No    FAMILY HISTORY: Family History  Problem Relation Age of Onset   Pulmonary fibrosis Mother    Colon polyps Mother    Diabetes Father    Breast cancer Sister    Esophageal cancer Maternal Uncle    Pancreatic cancer Maternal Aunt    Colon cancer Neg Hx    Stomach cancer Neg Hx    Rectal cancer Neg Hx     ALLERGIES:  is allergic to codeine, iodine, and penicillins.  MEDICATIONS:  Current Outpatient Medications  Medication Sig Dispense Refill   aspirin 81 MG tablet Take 81 mg by mouth daily.     cholecalciferol (VITAMIN D ) 1000 UNITS tablet Take 2,000  Units by mouth daily.     hydroxyurea  (HYDREA ) 500 MG capsule TAKE 3 CAPSULES BY MOUTH EVERY DAY 270 capsule 4   Multiple Vitamins-Minerals (ICAPS AREDS 2 PO) Take by mouth.     ondansetron  (ZOFRAN ) 4 MG tablet Take 1 tablet (4 mg total) by mouth every 8 (eight) hours as needed for nausea or vomiting. 20 tablet 1   rosuvastatin  (CRESTOR ) 5 MG tablet Take 1 tablet (5 mg total) by mouth daily. 90 tablet 3   No current facility-administered medications for this visit.    REVIEW OF SYSTEMS:    10 Point review of Systems was done is negative except as noted above.   PHYSICAL EXAMINATION: ECOG PERFORMANCE STATUS: 1 - Symptomatic but completely ambulatory  . Vitals:   03/19/24 0909  BP: (!) 163/54  Pulse: 71  Resp: 18  Temp: (!) 97 F (36.1 C)  SpO2: 97%   Filed Weights   03/19/24 0909  Weight: 125 lb 6.4 oz (56.9 kg)  .Body mass index is 22.94 kg/m. GENERAL:alert, in no acute distress and comfortable NECK: supple, no JVD LYMPH:  no palpable lymphadenopathy in the cervical, axillary or inguinal regions LUNGS: clear to auscultation b/l with normal respiratory effort HEART: regular rate & rhythm ABDOMEN:  normoactive bowel sounds , non tender, not distended. Extremity: no pedal  edema PSYCH: alert & oriented x 3 with fluent speech NEURO: no focal motor/sensory deficits   LABORATORY DATA:  I have reviewed the data as listed .    Latest Ref Rng & Units 03/19/2024    8:08 AM 01/17/2024    8:16 AM 11/16/2023   10:05 AM  CBC  WBC 4.0 - 10.5 K/uL 7.0  7.0  6.5   Hemoglobin 12.0 - 15.0 g/dL 86.5  84.7  84.0   Hematocrit 36.0 - 46.0 % 38.6  43.2  45.3   Platelets 150 - 400 K/uL 573  600  669    .    Latest Ref Rng & Units 03/19/2024    8:08 AM 01/17/2024    8:16 AM 12/02/2023   12:15 PM  CMP  Glucose 70 - 99 mg/dL 90  92  86   BUN 8 - 23 mg/dL 16  20  20    Creatinine 0.44 - 1.00 mg/dL 8.90  8.85  8.99   Sodium 135 - 145 mmol/L 140  140  140   Potassium 3.5 - 5.1 mmol/L 4.1  4.3  5.9   Chloride 98 - 111 mmol/L 107  104  103   CO2 22 - 32 mmol/L 26  28  26    Calcium  8.9 - 10.3 mg/dL 9.0  9.7  89.6   Total Protein 6.5 - 8.1 g/dL 6.5  7.3  7.7   Total Bilirubin 0.0 - 1.2 mg/dL 0.5  0.6  0.7   Alkaline Phos 38 - 126 U/L 58  80    AST 15 - 41 U/L 18  20  19    ALT 0 - 44 U/L 11  12  12      RADIOGRAPHIC STUDIES: I have personally reviewed the radiological images as listed and agreed with the findings in the report. No results found.  ASSESSMENT & PLAN:   78 year old woman with:  1. Myeloproliferative disorder diagnosed in 2007.  She was found to have JAK2 positive mutation, elevated platelet count confirmed by bone marrow biopsy. She is currently on hydroxyurea  with reasonable control of her platelet count.  2.  Polymyalgia rheumatica: unclear if this is related to  her myeloproliferative disorder.  She is off prednisone  at this time without any recent reactivation.   3.  Thrombosis prophylaxis: Risk of thrombosis remains low at this time.  Her white cell count is normal and has not had any thrombosis episodes.  PLAN:  -Discussed lab results from today, 03/19/2024, in detail with patient.  -CBC shows platelet count of improved to 573k with normal hemoglobin  and WBC count CMP stable B12 within normal limits at 446 Patient has no notable toxicities from her current dose of hydroxyurea  Discussed importance of sun protection especially currently in summer She will continue her hydroxyurea  at 1500 mg p.o. daily Patient recently had a mammogram on 02/06/2024 which showed no mammographic evidence of malignancy.   FOLLOW-UP:  Return to clinic with Dr. Onesimo with labs in 2 months   The total time spent in the appointment was 20 minutes* .  All of the patient's questions were answered with apparent satisfaction. The patient knows to call the clinic with any problems, questions or concerns.   Emaline Onesimo MD MS AAHIVMS [facility] Hematology/Oncology Physician [facility]  .*Total Encounter Time as defined by the Centers for Medicare and Medicaid Services includes, in addition to the face-to-face time of a patient visit (documented in the note above) non-face-to-face time: obtaining and reviewing outside history, ordering and reviewing medications, tests or procedures, care coordination (communications with other health care professionals or caregivers) and documentation in the medical record.    I,Mitra Faeizi,acting as a Neurosurgeon for Emaline Onesimo, MD.,have documented all relevant documentation on the behalf of Emaline Onesimo, MD,as directed by  Emaline Onesimo, MD while in the presence of Emaline Onesimo, MD.  .I have reviewed the above documentation for accuracy and completeness, and I agree with the above. .Grigor Lipschutz Kishore Mylin Gignac MD

## 2024-03-19 ENCOUNTER — Inpatient Hospital Stay: Attending: Oncology

## 2024-03-19 ENCOUNTER — Inpatient Hospital Stay (HOSPITAL_BASED_OUTPATIENT_CLINIC_OR_DEPARTMENT_OTHER): Admitting: Hematology

## 2024-03-19 VITALS — BP 163/54 | HR 71 | Temp 97.0°F | Resp 18 | Wt 125.4 lb

## 2024-03-19 DIAGNOSIS — D473 Essential (hemorrhagic) thrombocythemia: Secondary | ICD-10-CM

## 2024-03-19 DIAGNOSIS — D471 Chronic myeloproliferative disease: Secondary | ICD-10-CM | POA: Diagnosis not present

## 2024-03-19 DIAGNOSIS — Z803 Family history of malignant neoplasm of breast: Secondary | ICD-10-CM | POA: Diagnosis not present

## 2024-03-19 DIAGNOSIS — M542 Cervicalgia: Secondary | ICD-10-CM | POA: Diagnosis not present

## 2024-03-19 DIAGNOSIS — M898X9 Other specified disorders of bone, unspecified site: Secondary | ICD-10-CM | POA: Diagnosis not present

## 2024-03-19 DIAGNOSIS — Z825 Family history of asthma and other chronic lower respiratory diseases: Secondary | ICD-10-CM | POA: Diagnosis not present

## 2024-03-19 DIAGNOSIS — Z888 Allergy status to other drugs, medicaments and biological substances status: Secondary | ICD-10-CM | POA: Diagnosis not present

## 2024-03-19 DIAGNOSIS — Z7982 Long term (current) use of aspirin: Secondary | ICD-10-CM | POA: Insufficient documentation

## 2024-03-19 DIAGNOSIS — E785 Hyperlipidemia, unspecified: Secondary | ICD-10-CM | POA: Insufficient documentation

## 2024-03-19 DIAGNOSIS — Z880 Allergy status to penicillin: Secondary | ICD-10-CM | POA: Insufficient documentation

## 2024-03-19 DIAGNOSIS — Z83719 Family history of colon polyps, unspecified: Secondary | ICD-10-CM | POA: Diagnosis not present

## 2024-03-19 DIAGNOSIS — Z9049 Acquired absence of other specified parts of digestive tract: Secondary | ICD-10-CM | POA: Insufficient documentation

## 2024-03-19 DIAGNOSIS — D75839 Thrombocytosis, unspecified: Secondary | ICD-10-CM | POA: Diagnosis not present

## 2024-03-19 DIAGNOSIS — Z885 Allergy status to narcotic agent status: Secondary | ICD-10-CM | POA: Insufficient documentation

## 2024-03-19 DIAGNOSIS — Z87891 Personal history of nicotine dependence: Secondary | ICD-10-CM | POA: Diagnosis not present

## 2024-03-19 DIAGNOSIS — M1712 Unilateral primary osteoarthritis, left knee: Secondary | ICD-10-CM | POA: Insufficient documentation

## 2024-03-19 DIAGNOSIS — Z79899 Other long term (current) drug therapy: Secondary | ICD-10-CM | POA: Insufficient documentation

## 2024-03-19 DIAGNOSIS — Z90710 Acquired absence of both cervix and uterus: Secondary | ICD-10-CM | POA: Insufficient documentation

## 2024-03-19 DIAGNOSIS — Z833 Family history of diabetes mellitus: Secondary | ICD-10-CM | POA: Insufficient documentation

## 2024-03-19 DIAGNOSIS — M353 Polymyalgia rheumatica: Secondary | ICD-10-CM | POA: Insufficient documentation

## 2024-03-19 DIAGNOSIS — Z7964 Long term (current) use of myelosuppressive agent: Secondary | ICD-10-CM | POA: Diagnosis not present

## 2024-03-19 LAB — CBC WITH DIFFERENTIAL (CANCER CENTER ONLY)
Abs Immature Granulocytes: 0.02 10*3/uL (ref 0.00–0.07)
Basophils Absolute: 0.1 10*3/uL (ref 0.0–0.1)
Basophils Relative: 1 %
Eosinophils Absolute: 0.2 10*3/uL (ref 0.0–0.5)
Eosinophils Relative: 3 %
HCT: 38.6 % (ref 36.0–46.0)
Hemoglobin: 13.4 g/dL (ref 12.0–15.0)
Immature Granulocytes: 0 %
Lymphocytes Relative: 16 %
Lymphs Abs: 1.2 10*3/uL (ref 0.7–4.0)
MCH: 39.5 pg — ABNORMAL HIGH (ref 26.0–34.0)
MCHC: 34.7 g/dL (ref 30.0–36.0)
MCV: 113.9 fL — ABNORMAL HIGH (ref 80.0–100.0)
Monocytes Absolute: 0.7 10*3/uL (ref 0.1–1.0)
Monocytes Relative: 10 %
Neutro Abs: 4.9 10*3/uL (ref 1.7–7.7)
Neutrophils Relative %: 70 %
Platelet Count: 573 10*3/uL — ABNORMAL HIGH (ref 150–400)
RBC: 3.39 MIL/uL — ABNORMAL LOW (ref 3.87–5.11)
RDW: 14.5 % (ref 11.5–15.5)
WBC Count: 7 10*3/uL (ref 4.0–10.5)
nRBC: 0 % (ref 0.0–0.2)

## 2024-03-19 LAB — CMP (CANCER CENTER ONLY)
ALT: 11 U/L (ref 0–44)
AST: 18 U/L (ref 15–41)
Albumin: 4.3 g/dL (ref 3.5–5.0)
Alkaline Phosphatase: 58 U/L (ref 38–126)
Anion gap: 7 (ref 5–15)
BUN: 16 mg/dL (ref 8–23)
CO2: 26 mmol/L (ref 22–32)
Calcium: 9 mg/dL (ref 8.9–10.3)
Chloride: 107 mmol/L (ref 98–111)
Creatinine: 1.09 mg/dL — ABNORMAL HIGH (ref 0.44–1.00)
GFR, Estimated: 52 mL/min — ABNORMAL LOW (ref >60)
Glucose, Bld: 90 mg/dL (ref 70–99)
Potassium: 4.1 mmol/L (ref 3.5–5.1)
Sodium: 140 mmol/L (ref 135–145)
Total Bilirubin: 0.5 mg/dL (ref 0.0–1.2)
Total Protein: 6.5 g/dL (ref 6.5–8.1)

## 2024-03-19 LAB — VITAMIN B12: Vitamin B-12: 446 pg/mL (ref 180–914)

## 2024-04-02 DIAGNOSIS — H353131 Nonexudative age-related macular degeneration, bilateral, early dry stage: Secondary | ICD-10-CM | POA: Diagnosis not present

## 2024-04-02 DIAGNOSIS — H2513 Age-related nuclear cataract, bilateral: Secondary | ICD-10-CM | POA: Diagnosis not present

## 2024-04-02 DIAGNOSIS — H52203 Unspecified astigmatism, bilateral: Secondary | ICD-10-CM | POA: Diagnosis not present

## 2024-04-02 DIAGNOSIS — H5213 Myopia, bilateral: Secondary | ICD-10-CM | POA: Diagnosis not present

## 2024-04-04 ENCOUNTER — Ambulatory Visit: Admitting: Orthopaedic Surgery

## 2024-04-04 DIAGNOSIS — M25562 Pain in left knee: Secondary | ICD-10-CM | POA: Diagnosis not present

## 2024-04-04 DIAGNOSIS — G8929 Other chronic pain: Secondary | ICD-10-CM

## 2024-04-04 DIAGNOSIS — M1712 Unilateral primary osteoarthritis, left knee: Secondary | ICD-10-CM | POA: Diagnosis not present

## 2024-04-04 MED ORDER — LIDOCAINE HCL 1 % IJ SOLN
3.0000 mL | INTRAMUSCULAR | Status: AC | PRN
  Administered 2024-04-04: 3 mL

## 2024-04-04 MED ORDER — METHYLPREDNISOLONE ACETATE 40 MG/ML IJ SUSP
40.0000 mg | INTRAMUSCULAR | Status: AC | PRN
  Administered 2024-04-04: 40 mg via INTRA_ARTICULAR

## 2024-04-04 NOTE — Progress Notes (Signed)
 The patient is a 78 year old female well-known to us .  She has significant pain and swelling with her left knee.  We have seen her for some time for this knee and have aspirated fluid from the knee and placed a steroid injection in her knee.  The last time we did this was in November 2023.  She said the knee does have fluid on it again today and has been hurting for about 2 to 3 months now and she is requesting an aspiration and steroid injection.  She did see her oncologist recently who suggested she see us .  She has had no acute change in her medical status.  A MRI in 2023 of her left knee showed significant cartilage wear throughout the knee and the only other surgical option would be a knee replacement.  She is still not interested in any type of surgical intervention as of yet.  She otherwise denies any significant acute change in her medical condition or status.  Examination of her left knee does show a moderate effusion.  She does have a Baker's cyst and valgus malalignment of that knee and pain throughout the arc of motion of the knee.  I was able to aspirate 25 cc of clear fluid from the knee and then placed a steroid injection in her knee.  She walked on it much better after that and did feel better.  She knows that she can have a repeat injection if needed in 3 to 4 months from now.  She is definitely not interested in any type of knee replacement surgery.    Procedure Note  Patient: Anna Fuller             Date of Birth: [birth year]           MRN: [id number]             Visit Date: 04/04/2024  Procedures: Visit Diagnoses:  1. Primary osteoarthritis of left knee   2. Chronic pain of left knee     Large Joint Inj: L knee on 04/04/2024 1:07 PM Indications: diagnostic evaluation and pain Details: 22 G 1.5 in needle, superolateral approach  Arthrogram: No  Medications: 3 mL lidocaine  1 %; 40 mg methylPREDNISolone  acetate 40 MG/ML Outcome: tolerated well, no immediate  complications Procedure, treatment alternatives, risks and benefits explained, specific risks discussed. Consent was given by the patient. Immediately prior to procedure a time out was called to verify the correct patient, procedure, equipment, support staff and site/side marked as required. Patient was prepped and draped in the usual sterile fashion.

## 2024-04-13 ENCOUNTER — Encounter: Payer: Self-pay | Admitting: Advanced Practice Midwife

## 2024-05-14 ENCOUNTER — Other Ambulatory Visit: Payer: Self-pay

## 2024-05-14 DIAGNOSIS — D473 Essential (hemorrhagic) thrombocythemia: Secondary | ICD-10-CM

## 2024-05-15 ENCOUNTER — Inpatient Hospital Stay: Attending: Oncology | Admitting: Hematology

## 2024-05-15 ENCOUNTER — Inpatient Hospital Stay

## 2024-05-15 VITALS — BP 155/67 | HR 61 | Temp 97.5°F | Resp 18 | Wt 121.2 lb

## 2024-05-15 DIAGNOSIS — F1210 Cannabis abuse, uncomplicated: Secondary | ICD-10-CM | POA: Diagnosis not present

## 2024-05-15 DIAGNOSIS — Z833 Family history of diabetes mellitus: Secondary | ICD-10-CM | POA: Insufficient documentation

## 2024-05-15 DIAGNOSIS — D473 Essential (hemorrhagic) thrombocythemia: Secondary | ICD-10-CM | POA: Insufficient documentation

## 2024-05-15 DIAGNOSIS — M353 Polymyalgia rheumatica: Secondary | ICD-10-CM | POA: Insufficient documentation

## 2024-05-15 DIAGNOSIS — Z880 Allergy status to penicillin: Secondary | ICD-10-CM | POA: Insufficient documentation

## 2024-05-15 DIAGNOSIS — Z83719 Family history of colon polyps, unspecified: Secondary | ICD-10-CM | POA: Insufficient documentation

## 2024-05-15 DIAGNOSIS — E785 Hyperlipidemia, unspecified: Secondary | ICD-10-CM | POA: Diagnosis not present

## 2024-05-15 DIAGNOSIS — M25562 Pain in left knee: Secondary | ICD-10-CM | POA: Insufficient documentation

## 2024-05-15 DIAGNOSIS — D75839 Thrombocytosis, unspecified: Secondary | ICD-10-CM | POA: Diagnosis not present

## 2024-05-15 DIAGNOSIS — Z885 Allergy status to narcotic agent status: Secondary | ICD-10-CM | POA: Diagnosis not present

## 2024-05-15 DIAGNOSIS — Z9049 Acquired absence of other specified parts of digestive tract: Secondary | ICD-10-CM | POA: Diagnosis not present

## 2024-05-15 DIAGNOSIS — Z87891 Personal history of nicotine dependence: Secondary | ICD-10-CM | POA: Diagnosis not present

## 2024-05-15 DIAGNOSIS — D471 Chronic myeloproliferative disease: Secondary | ICD-10-CM | POA: Insufficient documentation

## 2024-05-15 DIAGNOSIS — Z79899 Other long term (current) drug therapy: Secondary | ICD-10-CM | POA: Diagnosis not present

## 2024-05-15 DIAGNOSIS — Z888 Allergy status to other drugs, medicaments and biological substances status: Secondary | ICD-10-CM | POA: Insufficient documentation

## 2024-05-15 DIAGNOSIS — Z90710 Acquired absence of both cervix and uterus: Secondary | ICD-10-CM | POA: Diagnosis not present

## 2024-05-15 DIAGNOSIS — Z7982 Long term (current) use of aspirin: Secondary | ICD-10-CM | POA: Insufficient documentation

## 2024-05-15 DIAGNOSIS — Z803 Family history of malignant neoplasm of breast: Secondary | ICD-10-CM | POA: Insufficient documentation

## 2024-05-15 LAB — CBC WITH DIFFERENTIAL (CANCER CENTER ONLY)
Abs Immature Granulocytes: 0.04 K/uL (ref 0.00–0.07)
Basophils Absolute: 0.1 K/uL (ref 0.0–0.1)
Basophils Relative: 1 %
Eosinophils Absolute: 0.1 K/uL (ref 0.0–0.5)
Eosinophils Relative: 2 %
HCT: 39.6 % (ref 36.0–46.0)
Hemoglobin: 13.8 g/dL (ref 12.0–15.0)
Immature Granulocytes: 1 %
Lymphocytes Relative: 21 %
Lymphs Abs: 1.5 K/uL (ref 0.7–4.0)
MCH: 40 pg — ABNORMAL HIGH (ref 26.0–34.0)
MCHC: 34.8 g/dL (ref 30.0–36.0)
MCV: 114.8 fL — ABNORMAL HIGH (ref 80.0–100.0)
Monocytes Absolute: 0.7 K/uL (ref 0.1–1.0)
Monocytes Relative: 10 %
Neutro Abs: 4.6 K/uL (ref 1.7–7.7)
Neutrophils Relative %: 65 %
Platelet Count: 603 K/uL — ABNORMAL HIGH (ref 150–400)
RBC: 3.45 MIL/uL — ABNORMAL LOW (ref 3.87–5.11)
RDW: 15.1 % (ref 11.5–15.5)
WBC Count: 7.1 K/uL (ref 4.0–10.5)
nRBC: 0 % (ref 0.0–0.2)

## 2024-05-15 LAB — CMP (CANCER CENTER ONLY)
ALT: 14 U/L (ref 0–44)
AST: 18 U/L (ref 15–41)
Albumin: 4.4 g/dL (ref 3.5–5.0)
Alkaline Phosphatase: 57 U/L (ref 38–126)
Anion gap: 6 (ref 5–15)
BUN: 18 mg/dL (ref 8–23)
CO2: 28 mmol/L (ref 22–32)
Calcium: 9 mg/dL (ref 8.9–10.3)
Chloride: 106 mmol/L (ref 98–111)
Creatinine: 1.02 mg/dL — ABNORMAL HIGH (ref 0.44–1.00)
GFR, Estimated: 56 mL/min — ABNORMAL LOW (ref >60)
Glucose, Bld: 86 mg/dL (ref 70–99)
Potassium: 4 mmol/L (ref 3.5–5.1)
Sodium: 140 mmol/L (ref 135–145)
Total Bilirubin: 0.5 mg/dL (ref 0.0–1.2)
Total Protein: 6.5 g/dL (ref 6.5–8.1)

## 2024-05-15 LAB — VITAMIN B12: Vitamin B-12: 347 pg/mL (ref 180–914)

## 2024-05-18 DIAGNOSIS — Z23 Encounter for immunization: Secondary | ICD-10-CM | POA: Diagnosis not present

## 2024-05-21 NOTE — Progress Notes (Addendum)
 HEMATOLOGY/ONCOLOGY CLINIC NOTE  Date of Service: .05/15/2024  Patient Care Team: Perri Ronal PARAS, MD as PCP - General (Internal Medicine) Tobie Tonita POUR, DO as Consulting Physician (Neurology)  CHIEF COMPLAINTS/PURPOSE OF CONSULTATION:  F/u for continued evaluation and mx of Essential thrombocytosis   Prior Therapy:    Anagrelide initiated briefly and discontinued due to do intolerance 2008.   High-dose prednisone  due to polymyalgia rheumatica started in December 2022.  She was tapered off to 1 mg daily and was discontinued in May 2023.  HISTORY OF PRESENTING ILLNESS:  Anna Fuller is a wonderful 78 y.o. female who is here for continued evaluation and management of Essential thrombocytosis. Patient has been transferred to us  by Dr. Amadeo. Patient was diagnosed with JAK-2 positive myeloproliferative disorder in 2007. Essential thrombocythemia was confirmed in 2008 by bone marrow biopsy. Patient was also diagnosed with polymyalgia rheumatica in November 2022.   Patient was last seen by Dr. Amadeo on 09/30/2022 and she complained of left knee pain and mild myalgias. Patient's current treatment includes Hydroxyurea  1500 mg daily which was started in 2009.   INTERVAL HISTORY:  Anna Fuller is a wonderful 78 y.o. female who is here for continued evaluation and management of her essential thrombocytosis. She notes that she has been compliant taking her hydroxyurea . No acute new symptoms.  No new bone pains.  No fevers no chills no night sweats. No notable toxicities from her current dose of hydroxyurea . No new leg pain or swelling.  No no chest pain or shortness of breath. No infection issues.  MEDICAL HISTORY:  Past Medical History:  Diagnosis Date   Cancer [facility])    essential thrombocythemia   Hyperlipidemia    Macular degeneration    Neuromuscular disorder (HCC)    chemo induced neuropathy   Polycythemia    on the line   Thrombocytosis     SURGICAL  HISTORY: Past Surgical History:  Procedure Laterality Date   ABDOMINAL HYSTERECTOMY     CHOLECYSTECTOMY     COLONOSCOPY     extra bones removed     on feet, knees and wrist   POLYPECTOMY     TONSILLECTOMY     TUMOR EXCISION     left leg    SOCIAL HISTORY: Social History   Socioeconomic History   Marital status: Single    Spouse name: Not on file   Number of children: Not on file   Years of education: Not on file   Highest education level: Some college, no degree  Occupational History   Not on file  Tobacco Use   Smoking status: Former    Current packs/day: 0.00    Types: Cigarettes    Quit date: 08/18/2002    Years since quitting: 21.7   Smokeless tobacco: Never  Vaping Use   Vaping status: Never Used  Substance and Sexual Activity   Alcohol use: Yes    Alcohol/week: 0.0 standard drinks of alcohol    Comment: rare   Drug use: No   Sexual activity: Never  Other Topics Concern   Not on file  Social History Narrative   Right handed    Lives alone   Social Drivers of Health   Financial Resource Strain: Low Risk  (11/30/2023)   Overall Financial Resource Strain (CARDIA)    Difficulty of Paying Living Expenses: Not hard at all  Food Insecurity: No Food Insecurity (11/30/2023)   Hunger Vital Sign    Worried About Running Out of Food in  the Last Year: Never true    Ran Out of Food in the Last Year: Never true  Transportation Needs: No Transportation Needs (11/30/2023)   PRAPARE - Administrator, Civil Service (Medical): No    Lack of Transportation (Non-Medical): No  Physical Activity: Patient Declined (11/30/2023)   Exercise Vital Sign    Days of Exercise per Week: Patient declined    Minutes of Exercise per Session: Patient declined  Stress: No Stress Concern Present (11/30/2023)   [employer] of Occupational Health - Occupational Stress Questionnaire    Feeling of Stress : Not at all  Social Connections: Moderately Integrated (11/30/2023)   Social  Connection and Isolation Panel    Frequency of Communication with Friends and Family: More than three times a week    Frequency of Social Gatherings with Friends and Family: Three times a week    Attends Religious Services: More than 4 times per year    Active Member of Clubs or Organizations: Yes    Attends Banker Meetings: More than 4 times per year    Marital Status: Never married  Intimate Partner Violence: Not At Risk (12/05/2023)   Humiliation, Afraid, Rape, and Kick questionnaire    Fear of Current or Ex-Partner: No    Emotionally Abused: No    Physically Abused: No    Sexually Abused: No    FAMILY HISTORY: Family History  Problem Relation Age of Onset   Pulmonary fibrosis Mother    Colon polyps Mother    Diabetes Father    Breast cancer Sister    Esophageal cancer Maternal Uncle    Pancreatic cancer Maternal Aunt    Colon cancer Neg Hx    Stomach cancer Neg Hx    Rectal cancer Neg Hx     ALLERGIES:  is allergic to codeine, iodine, and penicillins.  MEDICATIONS:  Current Outpatient Medications  Medication Sig Dispense Refill   aspirin 81 MG tablet Take 81 mg by mouth daily.     cholecalciferol (VITAMIN D ) 1000 UNITS tablet Take 2,000 Units by mouth daily.     hydroxyurea  (HYDREA ) 500 MG capsule TAKE 3 CAPSULES BY MOUTH EVERY DAY 270 capsule 4   Multiple Vitamins-Minerals (ICAPS AREDS 2 PO) Take by mouth.     ondansetron  (ZOFRAN ) 4 MG tablet Take 1 tablet (4 mg total) by mouth every 8 (eight) hours as needed for nausea or vomiting. 20 tablet 1   rosuvastatin  (CRESTOR ) 5 MG tablet Take 1 tablet (5 mg total) by mouth daily. 90 tablet 3   No current facility-administered medications for this visit.    REVIEW OF SYSTEMS:   .10 Point review of Systems was done is negative except as noted above.  PHYSICAL EXAMINATION: ECOG PERFORMANCE STATUS: 1 - Symptomatic but completely ambulatory  . Vitals:   05/15/24 0908 05/15/24 0914  BP: (!) 175/59 (!) 155/67   Pulse: 61   Resp: 18   Temp: (!) 97.5 F (36.4 C)   SpO2: 97%    Filed Weights   05/15/24 0908  Weight: 121 lb 3.2 oz (55 kg)  .Body mass index is 22.17 kg/m. SABRA GENERAL:alert, in no acute distress and comfortable SKIN: no acute rashes, no significant lesions EYES: conjunctiva are pink and non-injected, sclera anicteric OROPHARYNX: MMM, no exudates, no oropharyngeal erythema or ulceration NECK: supple, no JVD LYMPH:  no palpable lymphadenopathy in the cervical, axillary or inguinal regions LUNGS: clear to auscultation b/l with normal respiratory effort HEART: regular rate & rhythm  ABDOMEN:  normoactive bowel sounds , non tender, not distended. Extremity: no pedal edema PSYCH: alert & oriented x 3 with fluent speech NEURO: no focal motor/sensory deficits  LABORATORY DATA:  I have reviewed the data as listed .    Latest Ref Rng & Units 05/15/2024    8:26 AM 03/19/2024    8:08 AM 01/17/2024    8:16 AM  CBC  WBC 4.0 - 10.5 K/uL 7.1  7.0  7.0   Hemoglobin 12.0 - 15.0 g/dL 86.1  86.5  84.7   Hematocrit 36.0 - 46.0 % 39.6  38.6  43.2   Platelets 150 - 400 K/uL 603  573  600    .    Latest Ref Rng & Units 05/15/2024    8:26 AM 03/19/2024    8:08 AM 01/17/2024    8:16 AM  CMP  Glucose 70 - 99 mg/dL 86  90  92   BUN 8 - 23 mg/dL 18  16  20    Creatinine 0.44 - 1.00 mg/dL 8.97  8.90  8.85   Sodium 135 - 145 mmol/L 140  140  140   Potassium 3.5 - 5.1 mmol/L 4.0  4.1  4.3   Chloride 98 - 111 mmol/L 106  107  104   CO2 22 - 32 mmol/L 28  26  28    Calcium  8.9 - 10.3 mg/dL 9.0  9.0  9.7   Total Protein 6.5 - 8.1 g/dL 6.5  6.5  7.3   Total Bilirubin 0.0 - 1.2 mg/dL 0.5  0.5  0.6   Alkaline Phos 38 - 126 U/L 57  58  80   AST 15 - 41 U/L 18  18  20    ALT 0 - 44 U/L 14  11  12      RADIOGRAPHIC STUDIES: I have personally reviewed the radiological images as listed and agreed with the findings in the report. No results found.  ASSESSMENT & PLAN:   78 year old woman with:  1.   JAK2 positive essential thrombocytosis diagnosed in 2007.   2.  Polymyalgia rheumatica: unclear if this is related to her myeloproliferative disorder.  She is off prednisone  at this time without any recent reactivation.   3.  Thrombosis prophylaxis: Risk of thrombosis remains low at this time.  Her white cell count is normal and has not had any thrombosis episodes.  PLAN: Discussed lab results from 05/15/2024 Patient CBC is stable with a hemoglobin of 13.8 with a hematocrit of 39.6, platelets of 603k and WBC count of 7.1k CMP stable Patient's platelets are at goal of around <=600k. Patient continues to tolerate her hydroxyurea  at 1500 mg p.o. daily.  She is on a sizable dose and further dose increases will increase her risk of toxicities. - No indication to be more aggressive with her hydroxyurea  dose in the absence of previous thrombosis. - She continues to follow some precautions. - No infection issues or other notable toxicities. - She continues to be on aspirin 81 mg p.o. daily for VTE prophylaxis - She will continue her multivitamins. - She was recommended to stay up to speed with her age-appropriate vaccinations with her PCP.  FOLLOW-UP:  Return to clinic with Dr. Onesimo with labs in 2 months   The total time spent in the appointment was 20 minutes*.  All of the patient's questions were answered with apparent satisfaction. The patient knows to call the clinic with any problems, questions or concerns.   Emaline Onesimo MD MS AAHIVMS [facility]  Hematology/Oncology Physician [facility]  .*Total Encounter Time as defined by the Centers for Medicare and Medicaid Services includes, in addition to the face-to-face time of a patient visit (documented in the note above) non-face-to-face time: obtaining and reviewing outside history, ordering and reviewing medications, tests or procedures, care coordination (communications with other health care professionals or caregivers) and  documentation in the medical record.

## 2024-06-08 ENCOUNTER — Other Ambulatory Visit

## 2024-06-08 DIAGNOSIS — E782 Mixed hyperlipidemia: Secondary | ICD-10-CM

## 2024-06-08 LAB — HEPATIC FUNCTION PANEL
AG Ratio: 2.1 (calc) (ref 1.0–2.5)
ALT: 16 U/L (ref 6–29)
AST: 19 U/L (ref 10–35)
Albumin: 4.5 g/dL (ref 3.6–5.1)
Alkaline phosphatase (APISO): 63 U/L (ref 37–153)
Bilirubin, Direct: 0.1 mg/dL (ref 0.0–0.2)
Globulin: 2.1 g/dL (ref 1.9–3.7)
Indirect Bilirubin: 0.4 mg/dL (ref 0.2–1.2)
Total Bilirubin: 0.5 mg/dL (ref 0.2–1.2)
Total Protein: 6.6 g/dL (ref 6.1–8.1)

## 2024-06-08 LAB — LIPID PANEL
Cholesterol: 156 mg/dL (ref <200)
HDL: 62 mg/dL (ref >=50)
LDL Cholesterol (Calc): 73 mg/dL
Non-HDL Cholesterol (Calc): 94 mg/dL (ref <130)
Total CHOL/HDL Ratio: 2.5 (calc) (ref <5.0)
Triglycerides: 119 mg/dL (ref <150)

## 2024-06-10 ENCOUNTER — Ambulatory Visit: Payer: Self-pay | Admitting: Internal Medicine

## 2024-06-11 ENCOUNTER — Ambulatory Visit (INDEPENDENT_AMBULATORY_CARE_PROVIDER_SITE_OTHER): Admitting: Internal Medicine

## 2024-06-11 ENCOUNTER — Ambulatory Visit: Admitting: Internal Medicine

## 2024-06-11 ENCOUNTER — Encounter: Payer: Self-pay | Admitting: Internal Medicine

## 2024-06-11 VITALS — BP 130/80 | HR 80 | Ht 62.0 in | Wt 121.0 lb

## 2024-06-11 DIAGNOSIS — D473 Essential (hemorrhagic) thrombocythemia: Secondary | ICD-10-CM

## 2024-06-11 DIAGNOSIS — Z90722 Acquired absence of ovaries, bilateral: Secondary | ICD-10-CM | POA: Diagnosis not present

## 2024-06-11 DIAGNOSIS — Z90710 Acquired absence of both cervix and uterus: Secondary | ICD-10-CM | POA: Diagnosis not present

## 2024-06-11 DIAGNOSIS — E782 Mixed hyperlipidemia: Secondary | ICD-10-CM

## 2024-06-11 DIAGNOSIS — M1712 Unilateral primary osteoarthritis, left knee: Secondary | ICD-10-CM | POA: Diagnosis not present

## 2024-06-11 DIAGNOSIS — Z1589 Genetic susceptibility to other disease: Secondary | ICD-10-CM

## 2024-06-11 DIAGNOSIS — Z9079 Acquired absence of other genital organ(s): Secondary | ICD-10-CM

## 2024-06-11 NOTE — Progress Notes (Signed)
 Patient Care Team: Perri Ronal PARAS, MD as PCP - General (Internal Medicine) Tobie Tonita POUR, DO as Consulting Physician (Neurology)  Visit Date: 06/11/24  Subjective:    Patient ID: Anna Fuller , Female   DOB: [birth year], 78 y.o.    MRN: [id number]   78 y.o. Female presents today for 6 month follow up for Hyperlipidemia and Essential Thrombocytosis. Patient has a past medical history of essential thrombocytosis, Hyperlipidemia, Macular degeneration, history of polymyalgia.   History of Mixed hyperlipidemia treated with rosuvastatin  5 mg daily. 06/08/2024 Lipid panel CHOL 156, HDL 62, Triglycerides 119, LDL 73 decreased from 12/02/2023 CHOL 248. HDL 87, Triglycerides 137, LDL 135.    PMH: Essential Thrombocytosis JAK2 Myeloproliferative Disorder, diagnosed 2007 and JAK2 mutation with an elevated platelet count confirmed via bone marrow biopsy. Treated with Hydrea  1500 mg daily. Dr. Amadeo ordered bone marrow biopsy and CT scan on 09/14/21 and PET scan on 08/24/21 to determine if cancer had worsened, it had not. She has a new oncologist, Dr. Emaline Saran. 11/16/2023 CBC from [facility], compared to 10/05/23: Hgb 15.9, decreased from 16.7; MCV 109.7, decreased slightly from 110; MCH 38.5, decreased slightly from 39; Platelets 669, decreased significantly from 901. 05/15/2024 CBC RBC 3.45, MCV 114.8, MCH 40.0, Platelet count 603. 05/15/2024 CMP creatinine 1.02, eGFR 56 otherwise WNL.    Kidney functions normal in June.  History of osteoarthritis of the left knee. Dr Medford Poli at [facility] suggested that she have her knee replaced but she said she is not interested in having surgery.    Vaccine counseling: Covid-19 and Influenza vaccine due.     Labs 06/08/2024 WNL   Past Medical History:  Diagnosis Date   Cancer [facility])    essential thrombocythemia   Hyperlipidemia    Macular degeneration    Neuromuscular disorder (HCC)    chemo induced neuropathy    Polycythemia    on the line   Thrombocytosis      Family History  Problem Relation Age of Onset   Pulmonary fibrosis Mother    Colon polyps Mother    Diabetes Father    Breast cancer Sister    Esophageal cancer Maternal Uncle    Pancreatic cancer Maternal Aunt    Colon cancer Neg Hx    Stomach cancer Neg Hx    Rectal cancer Neg Hx     Social History   Social History Narrative   Right handed    Lives alone   Retired. Previously worked in Presenter, broadcasting at [employer]. Does not smoke or consume alcohol. Completed 2 years of college.    Review of Systems  Constitutional:  Negative for fever and malaise/fatigue.  HENT:  Negative for congestion.   Eyes:  Negative for blurred vision.  Respiratory:  Negative for cough and shortness of breath.   Cardiovascular:  Negative for chest pain, palpitations and leg swelling.  Gastrointestinal:  Negative for vomiting.  Musculoskeletal:  Negative for back pain.  Skin:  Negative for rash.  Neurological:  Negative for loss of consciousness and headaches.        Objective:   Vitals: BP 130/80   Pulse 80   Ht 5' 2 (1.575 m)   Wt 121 lb (54.9 kg)   SpO2 97%   BMI 22.13 kg/m    Physical Exam Vitals and nursing note reviewed.  Constitutional:      General: She is not in acute distress.    Appearance: Normal appearance. She is not  toxic-appearing.  HENT:     Head: Normocephalic and atraumatic.  Cardiovascular:     Rate and Rhythm: Normal rate and regular rhythm. No extrasystoles are present.    Pulses: Normal pulses.     Heart sounds: Normal heart sounds. No murmur heard.    No friction rub. No gallop.  Pulmonary:     Effort: Pulmonary effort is normal. No respiratory distress.     Breath sounds: Normal breath sounds. No wheezing or rales.  Skin:    General: Skin is warm and dry.  Neurological:     Mental Status: She is alert and oriented to person, place, and time. Mental status is at baseline.  Psychiatric:        Mood and  Affect: Mood normal.        Behavior: Behavior normal.        Thought Content: Thought content normal.        Judgment: Judgment normal.       Results:     Labs:       Component Value Date/Time   NA 140 05/15/2024 0826   NA 140 09/28/2017 0811   K 4.0 05/15/2024 0826   K 3.8 09/28/2017 0811   CL 106 05/15/2024 0826   CL 105 01/30/2013 0825   CO2 28 05/15/2024 0826   CO2 25 09/28/2017 0811   GLUCOSE 86 05/15/2024 0826   GLUCOSE 88 09/28/2017 0811   GLUCOSE 88 01/30/2013 0825   BUN 18 05/15/2024 0826   BUN 14.6 09/28/2017 0811   CREATININE 1.02 (H) 05/15/2024 0826   CREATININE 1.00 12/02/2023 1215   CREATININE 0.9 09/28/2017 0811   CALCIUM  9.0 05/15/2024 0826   CALCIUM  9.1 09/28/2017 0811   PROT 6.6 06/08/2024 0908   PROT 7.0 09/28/2017 0811   ALBUMIN 4.4 05/15/2024 0826   ALBUMIN 4.2 09/28/2017 0811   AST 19 06/08/2024 0908   AST 18 05/15/2024 0826   AST 23 09/28/2017 0811   ALT 16 06/08/2024 0908   ALT 14 05/15/2024 0826   ALT 23 09/28/2017 0811   ALKPHOS 57 05/15/2024 0826   ALKPHOS 75 09/28/2017 0811   BILITOT 0.5 06/08/2024 0908   BILITOT 0.5 05/15/2024 0826   BILITOT 0.46 09/28/2017 0811   GFRNONAA 56 (L) 05/15/2024 0826   GFRNONAA 46 (L) 11/18/2020 0911   GFRAA 54 (L) 11/18/2020 0911     Lab Results  Component Value Date   WBC 7.1 05/15/2024   HGB 13.8 05/15/2024   HCT 39.6 05/15/2024   MCV 114.8 (H) 05/15/2024   PLT 603 (H) 05/15/2024    Lab Results  Component Value Date   CHOL 156 06/08/2024   HDL 62 06/08/2024   LDLCALC 73 06/08/2024   TRIG 119 06/08/2024   CHOLHDL 2.5 06/08/2024    No results found for: HGBA1C   Lab Results  Component Value Date   TSH 3.05 12/02/2023      Assessment & Plan:    Hyperlipidemia: treated with rosuvastatin  5 mg daily. 06/08/2024 Lipid panel CHOL 156, HDL 62, Triglycerides 119, LDL 73 decreased from 12/02/2023 CHOL 248. HDL 87, Triglycerides 137, LDL 135.  Essential Thrombocytosis JAK2  Myeloproliferative Disorder: diagnosed 2007 and JAK2 mutation with an elevated platelet count confirmed via bone marrow biopsy. Treated with Hydrea  1500 mg daily. Dr. Amadeo ordered bone marrow biopsy and CT scan on 09/14/21 and PET scan on 08/24/21 to determine if cancer had worsened, it had not. She has a new oncologist, Dr. Emaline Saran. 11/16/2023 CBC from  [facility], compared to 10/05/23: Hgb 15.9, decreased from 16.7; MCV 109.7, decreased slightly from 110; MCH 38.5, decreased slightly from 39; Platelets 669, decreased significantly from 901. 05/15/2024 CBC RBC 3.45, MCV 114.8, MCH 40.0, Platelet count 603. 05/15/2024 CMP creatinine 1.02, eGFR 56 otherwise WNL.   Hx of bilateral cataracts- followed by Dr. Robinson    Osteoarthritis left knee- does not surgery. Has seen Dr. Vernetta  Vaccine counseling: Covid-19 and Influenza vaccine due.    Plan: Lipids are stable on low dose Crestor . Continue this medication. Continue follow up with Hematology. Return in 6 months for medicare wellness visit.   Labs 06/08/2024 WNL   I,Makayla C Reid,acting as a scribe for Ronal JINNY Hailstone, MD.,have documented all relevant documentation on the behalf of Ronal JINNY Hailstone, MD,as directed by  Ronal JINNY Hailstone, MD while in the presence of Ronal JINNY Hailstone, MD.

## 2024-06-12 NOTE — Patient Instructions (Signed)
 It was a pleasure to see you today. Continue Crestor . Follow up for Annual medicare visit in 6 months.

## 2024-07-06 ENCOUNTER — Other Ambulatory Visit: Payer: Self-pay

## 2024-07-06 DIAGNOSIS — D473 Essential (hemorrhagic) thrombocythemia: Secondary | ICD-10-CM

## 2024-07-08 NOTE — Progress Notes (Signed)
 HEMATOLOGY ONCOLOGY PROGRESS NOTE  Date of service: 07/09/2024  Patient Care Team: Perri Ronal PARAS, MD as PCP - General (Internal Medicine) Patel, Donika K, DO as Consulting Physician (Neurology)  CHIEF COMPLAINT/PURPOSE OF CONSULTATION: F/u for continued evaluation and management of Essential thrombocytosis   HISTORY OF PRESENTING ILLNESS: Anna Fuller is a wonderful 78 y.o. female who is here for continued evaluation and management of Essential thrombocytosis. Patient has been transferred to us  by Dr. Amadeo. Patient was diagnosed with JAK-2 positive myeloproliferative disorder in 2007. Essential thrombocythemia was confirmed in 2008 by bone marrow biopsy. Patient was also diagnosed with polymyalgia rheumatica in November 2022.    Patient was last seen by Dr. Amadeo on 09/30/2022 and she complained of left knee pain and mild myalgias. Patient's current treatment includes Hydroxyurea  1500 mg daily which was started in 2009.   SUMMARY OF ONCOLOGIC HISTORY: Oncology History   No history exists.   Prior Therapy:   Anagrelide initiated briefly and discontinued due to do intolerance 2008.   High-dose prednisone  due to polymyalgia rheumatica started in December 2022.  She was tapered off to 1 mg daily and was discontinued in May 2023.  INTERVAL HISTORY:  Anna Fuller is a 77 y.o. female who is here today for continued evaluation and management of Essential Thrombocytosis.   she was last seen by me on 05/15/2024; at the time she did not have any concerns and was doing well.   Today, she says that she has been doing well. Does endorse some more fatigue lately. She does note that this may partly be related to some stress she has been dealing with as her long-time best friend recently suffered a stroke and has had severe side effects impacting their communication as their separated by states.  Additionally, says that she hasn't been sleeping well, which has been a chronic problem for  her but most recently this has worsened. Says that she tends to sleep for 2 hours and struggles to fall back asleep even after getting up and trying to occupy herself, the most she'll be able to sleep for is another hour. For relief, she has tried chamomile tea and Melatonin.  States that her Osteoarthritis has been causing increased left knee pain, but she is scheduled for another injection next week. This has started to cause some hip pain as well as she tries to accommodate her pain. She is followed by Dr. Lonni Poli for this, who has offered a knee replacement, but she has declined as she does not want her mobility to be more limited.   Also endorses aching in her arms and femurs, but this has also been chronic and she is not concerned.  REVIEW OF SYSTEMS:    10 Point review of systems of done and is negative except as noted above.  MEDICAL HISTORY Past Medical History:  Diagnosis Date   Cancer [facility])    essential thrombocythemia   Hyperlipidemia    Macular degeneration    Neuromuscular disorder (HCC)    chemo induced neuropathy   Polycythemia    on the line   Thrombocytosis     SURGICAL HISTORY Past Surgical History:  Procedure Laterality Date   ABDOMINAL HYSTERECTOMY     CHOLECYSTECTOMY     COLONOSCOPY     extra bones removed     on feet, knees and wrist   POLYPECTOMY     TONSILLECTOMY     TUMOR EXCISION     left leg    SOCIAL  HISTORY Social History   Tobacco Use   Smoking status: Former    Current packs/day: 0.00    Types: Cigarettes    Quit date: 08/18/2002    Years since quitting: 21.9   Smokeless tobacco: Never  Vaping Use   Vaping status: Never Used  Substance Use Topics   Alcohol use: Yes    Alcohol/week: 0.0 standard drinks of alcohol    Comment: rare   Drug use: No    Social History   Social History Narrative   Right handed    Lives alone    SOCIAL DRIVERS OF HEALTH SDOH Screenings   Food Insecurity: No Food Insecurity  (06/10/2024)  Housing: Unknown (06/10/2024)  Transportation Needs: No Transportation Needs (06/10/2024)  Utilities: Not At Risk (11/30/2023)  Alcohol Screen: Low Risk  (12/05/2023)  Depression (PHQ2-9): Low Risk  (12/05/2023)  Financial Resource Strain: Low Risk  (06/10/2024)  Physical Activity: Insufficiently Active (06/10/2024)  Social Connections: Moderately Isolated (06/10/2024)  Stress: No Stress Concern Present (06/10/2024)  Tobacco Use: Medium Risk (06/11/2024)  Health Literacy: Adequate Health Literacy (11/30/2023)     FAMILY HISTORY Family History  Problem Relation Age of Onset   Pulmonary fibrosis Mother    Colon polyps Mother    Diabetes Father    Breast cancer Sister    Esophageal cancer Maternal Uncle    Pancreatic cancer Maternal Aunt    Colon cancer Neg Hx    Stomach cancer Neg Hx    Rectal cancer Neg Hx      ALLERGIES: is allergic to codeine, iodine, and penicillins.  MEDICATIONS  Current Outpatient Medications  Medication Sig Dispense Refill   aspirin 81 MG tablet Take 81 mg by mouth daily.     cholecalciferol (VITAMIN D ) 1000 UNITS tablet Take 2,000 Units by mouth daily.     hydroxyurea  (HYDREA ) 500 MG capsule TAKE 3 CAPSULES BY MOUTH EVERY DAY 270 capsule 4   Multiple Vitamins-Minerals (ICAPS AREDS 2 PO) Take by mouth.     ondansetron  (ZOFRAN ) 4 MG tablet Take 1 tablet (4 mg total) by mouth every 8 (eight) hours as needed for nausea or vomiting. 20 tablet 1   rosuvastatin  (CRESTOR ) 5 MG tablet Take 1 tablet (5 mg total) by mouth daily. 90 tablet 3   No current facility-administered medications for this visit.    VITALS: Vitals:   07/09/24 0910  BP: 135/70  Pulse: 80  Resp: 18  Temp: (!) 97.3 F (36.3 C)  SpO2: 97%   Filed Weights   07/09/24 0910  Weight: 120 lb 1.6 oz (54.5 kg)   Body mass index is 21.97 kg/m.  PHYSICAL EXAMINATION: ECOG PERFORMANCE STATUS: 1 - Symptomatic but completely ambulatory .BP 135/70   Pulse 80   Temp (!) 97.3 F (36.3  C)   Resp 18   Wt 120 lb 1.6 oz (54.5 kg)   SpO2 97%   BMI 21.97 kg/m  GENERAL: alert, in no acute distress and comfortable SKIN: no acute rashes, no significant lesions EYES: conjunctiva are pink and non-injected, sclera anicteric OROPHARYNX: MMM, no exudates, no oropharyngeal erythema or ulceration NECK: supple, no JVD LYMPH:  no palpable lymphadenopathy in the cervical, axillary or inguinal regions LUNGS: clear to auscultation b/l with normal respiratory effort HEART: regular rate & rhythm ABDOMEN:  normoactive bowel sounds , non tender, not distended. Extremity: no pedal edema PSYCH: alert & oriented x 3 with fluent speech NEURO: no focal motor/sensory deficits  LABORATORY DATA:   I have reviewed the data as  listed     Latest Ref Rng & Units 07/09/2024    8:10 AM 05/15/2024    8:26 AM 03/19/2024    8:08 AM  CBC EXTENDED  WBC 4.0 - 10.5 K/uL 7.9  7.1  7.0   RBC 3.87 - 5.11 MIL/uL 4.02  3.45  3.39   Hemoglobin 12.0 - 15.0 g/dL 83.9  86.1  86.5   HCT 36.0 - 46.0 % 45.7  39.6  38.6   Platelets 150 - 400 K/uL 614  603  573   NEUT# 1.7 - 7.7 K/uL 5.6  4.6  4.9   Lymph# 0.7 - 4.0 K/uL 1.3  1.5  1.2    Vitamin B12:  Lab Results  Component Value Date   VITAMINB12 347 05/15/2024        Latest Ref Rng & Units 07/09/2024    8:10 AM 06/08/2024    9:08 AM 05/15/2024    8:26 AM  CMP  Glucose 70 - 99 mg/dL 87   86   BUN 8 - 23 mg/dL 21   18   Creatinine 9.55 - 1.00 mg/dL 9.06   8.97   Sodium 864 - 145 mmol/L 140   140   Potassium 3.5 - 5.1 mmol/L 4.3   4.0   Chloride 98 - 111 mmol/L 105   106   CO2 22 - 32 mmol/L 27   28   Calcium  8.9 - 10.3 mg/dL 89.8   9.0   Total Protein 6.5 - 8.1 g/dL 7.4  6.6  6.5   Total Bilirubin 0.0 - 1.2 mg/dL 0.6  0.5  0.5   Alkaline Phos 38 - 126 U/L 66   57   AST 15 - 41 U/L 21  19  18    ALT 0 - 44 U/L 15  16  14      RADIOGRAPHIC STUDIES: I have personally reviewed the radiological images as listed and agreed with the findings in the  report. No results found.  ASSESSMENT & PLAN:  78 y.o. female with  1.  JAK2 positive essential thrombocytosis diagnosed in 2007.     2.  Polymyalgia rheumatica: unclear if this is related to her myeloproliferative disorder.  She is off prednisone  at this time without any recent reactivation.   3.  Thrombosis prophylaxis: Risk of thrombosis remains low at this time.  Her white cell count is normal and has not had any thrombosis episodes.   PLAN: 1; 3 - Discussed lab results on 07/09/2024 in detail with patient: CBC showed WBC of 7.9K increased from 7.1K, Hemoglobin of 16.0 increased from 13.8, Hematocrit 45.7%, increased from 39.6%, and PLTs of 614K increased from 603K CMP stable.  Vitamin B12 wnl at 524 -avoid Iron supplementation - Patient's platelets are at goal of around <=600k. - She continues to tolerate her Hydroxyurea  at 1500 mg p.o. daily.  She is on a sizable dose and further dose increases will increase her risk of toxicities.  -No indication to be more aggressive with her hydroxyurea  dose in the absence of previous thrombosis. - She continues to follow some precautions. - No infection issues or other notable toxicities. - She continues to be on aspirin 81 mg p.o. daily for VTE prophylaxis - She will continue her multivitamins. - Discussed sleep hygiene: avoid screens, meditation, and possibly try Ashwaganda root extract 4-8 weeks to help reduce stress to help with sleep. Discuss this with PCP at next office visit.   FOLLOW-UP in 2 months for labs and follow-up with Dr.  Kenadee Gates  The total time spent in the appointment was 30 minutes* .  All of the patient's questions were answered and the patient knows to call the clinic with any problems, questions, or concerns.  Emaline Saran MD MS AAHIVMS [facility] Hematology/Oncology Physician [facility]  *Total Encounter Time as defined by the Centers for Medicare and Medicaid Services includes, in addition to the  face-to-face time of a patient visit (documented in the note above) non-face-to-face time: obtaining and reviewing outside history, ordering and reviewing medications, tests or procedures, care coordination (communications with other health care professionals or caregivers) and documentation in the medical record.  I,Emily Lagle,acting as a Neurosurgeon for Emaline Saran, MD.,have documented all relevant documentation on the behalf of Emaline Saran, MD,as directed by  Emaline Saran, MD while in the presence of Emaline Saran, MD.  I have reviewed the above documentation for accuracy and completeness, and I agree with the above.  Abigayle Wilinski, MD

## 2024-07-09 ENCOUNTER — Inpatient Hospital Stay: Attending: Oncology

## 2024-07-09 ENCOUNTER — Inpatient Hospital Stay (HOSPITAL_BASED_OUTPATIENT_CLINIC_OR_DEPARTMENT_OTHER): Admitting: Hematology

## 2024-07-09 VITALS — BP 135/70 | HR 80 | Temp 97.3°F | Resp 18 | Wt 120.1 lb

## 2024-07-09 DIAGNOSIS — Z880 Allergy status to penicillin: Secondary | ICD-10-CM | POA: Diagnosis not present

## 2024-07-09 DIAGNOSIS — Z79899 Other long term (current) drug therapy: Secondary | ICD-10-CM | POA: Diagnosis not present

## 2024-07-09 DIAGNOSIS — R5383 Other fatigue: Secondary | ICD-10-CM | POA: Diagnosis not present

## 2024-07-09 DIAGNOSIS — Z9049 Acquired absence of other specified parts of digestive tract: Secondary | ICD-10-CM | POA: Insufficient documentation

## 2024-07-09 DIAGNOSIS — M25559 Pain in unspecified hip: Secondary | ICD-10-CM | POA: Insufficient documentation

## 2024-07-09 DIAGNOSIS — Z87891 Personal history of nicotine dependence: Secondary | ICD-10-CM | POA: Insufficient documentation

## 2024-07-09 DIAGNOSIS — Z800 Family history of malignant neoplasm of digestive organs: Secondary | ICD-10-CM | POA: Diagnosis not present

## 2024-07-09 DIAGNOSIS — D473 Essential (hemorrhagic) thrombocythemia: Secondary | ICD-10-CM | POA: Diagnosis not present

## 2024-07-09 DIAGNOSIS — E785 Hyperlipidemia, unspecified: Secondary | ICD-10-CM | POA: Insufficient documentation

## 2024-07-09 DIAGNOSIS — Z7982 Long term (current) use of aspirin: Secondary | ICD-10-CM | POA: Insufficient documentation

## 2024-07-09 DIAGNOSIS — Z90710 Acquired absence of both cervix and uterus: Secondary | ICD-10-CM | POA: Diagnosis not present

## 2024-07-09 DIAGNOSIS — D471 Chronic myeloproliferative disease: Secondary | ICD-10-CM | POA: Insufficient documentation

## 2024-07-09 DIAGNOSIS — Z833 Family history of diabetes mellitus: Secondary | ICD-10-CM | POA: Diagnosis not present

## 2024-07-09 DIAGNOSIS — Z8673 Personal history of transient ischemic attack (TIA), and cerebral infarction without residual deficits: Secondary | ICD-10-CM | POA: Insufficient documentation

## 2024-07-09 DIAGNOSIS — M1712 Unilateral primary osteoarthritis, left knee: Secondary | ICD-10-CM | POA: Insufficient documentation

## 2024-07-09 DIAGNOSIS — Z83719 Family history of colon polyps, unspecified: Secondary | ICD-10-CM | POA: Insufficient documentation

## 2024-07-09 DIAGNOSIS — Z885 Allergy status to narcotic agent status: Secondary | ICD-10-CM | POA: Diagnosis not present

## 2024-07-09 DIAGNOSIS — Z888 Allergy status to other drugs, medicaments and biological substances status: Secondary | ICD-10-CM | POA: Insufficient documentation

## 2024-07-09 DIAGNOSIS — M353 Polymyalgia rheumatica: Secondary | ICD-10-CM | POA: Diagnosis not present

## 2024-07-09 DIAGNOSIS — Z803 Family history of malignant neoplasm of breast: Secondary | ICD-10-CM | POA: Diagnosis not present

## 2024-07-09 DIAGNOSIS — Z1589 Genetic susceptibility to other disease: Secondary | ICD-10-CM | POA: Diagnosis not present

## 2024-07-09 DIAGNOSIS — Z825 Family history of asthma and other chronic lower respiratory diseases: Secondary | ICD-10-CM | POA: Insufficient documentation

## 2024-07-09 LAB — CBC WITH DIFFERENTIAL (CANCER CENTER ONLY)
Abs Immature Granulocytes: 0.06 K/uL (ref 0.00–0.07)
Basophils Absolute: 0.1 K/uL (ref 0.0–0.1)
Basophils Relative: 1 %
Eosinophils Absolute: 0.1 K/uL (ref 0.0–0.5)
Eosinophils Relative: 2 %
HCT: 45.7 % (ref 36.0–46.0)
Hemoglobin: 16 g/dL — ABNORMAL HIGH (ref 12.0–15.0)
Immature Granulocytes: 1 %
Lymphocytes Relative: 17 %
Lymphs Abs: 1.3 K/uL (ref 0.7–4.0)
MCH: 39.8 pg — ABNORMAL HIGH (ref 26.0–34.0)
MCHC: 35 g/dL (ref 30.0–36.0)
MCV: 113.7 fL — ABNORMAL HIGH (ref 80.0–100.0)
Monocytes Absolute: 0.7 K/uL (ref 0.1–1.0)
Monocytes Relative: 9 %
Neutro Abs: 5.6 K/uL (ref 1.7–7.7)
Neutrophils Relative %: 70 %
Platelet Count: 614 K/uL — ABNORMAL HIGH (ref 150–400)
RBC: 4.02 MIL/uL (ref 3.87–5.11)
RDW: 14.1 % (ref 11.5–15.5)
WBC Count: 7.9 K/uL (ref 4.0–10.5)
nRBC: 0 % (ref 0.0–0.2)

## 2024-07-09 LAB — CMP (CANCER CENTER ONLY)
ALT: 15 U/L (ref 0–44)
AST: 21 U/L (ref 15–41)
Albumin: 4.8 g/dL (ref 3.5–5.0)
Alkaline Phosphatase: 66 U/L (ref 38–126)
Anion gap: 8 (ref 5–15)
BUN: 21 mg/dL (ref 8–23)
CO2: 27 mmol/L (ref 22–32)
Calcium: 10.1 mg/dL (ref 8.9–10.3)
Chloride: 105 mmol/L (ref 98–111)
Creatinine: 0.93 mg/dL (ref 0.44–1.00)
GFR, Estimated: >60 mL/min (ref >60)
Glucose, Bld: 87 mg/dL (ref 70–99)
Potassium: 4.3 mmol/L (ref 3.5–5.1)
Sodium: 140 mmol/L (ref 135–145)
Total Bilirubin: 0.6 mg/dL (ref 0.0–1.2)
Total Protein: 7.4 g/dL (ref 6.5–8.1)

## 2024-07-09 LAB — VITAMIN B12: Vitamin B-12: 524 pg/mL (ref 180–914)

## 2024-07-13 ENCOUNTER — Other Ambulatory Visit: Payer: Self-pay | Admitting: Hematology

## 2024-07-13 DIAGNOSIS — D473 Essential (hemorrhagic) thrombocythemia: Secondary | ICD-10-CM

## 2024-07-18 ENCOUNTER — Encounter: Payer: Self-pay | Admitting: Orthopaedic Surgery

## 2024-07-18 ENCOUNTER — Ambulatory Visit (INDEPENDENT_AMBULATORY_CARE_PROVIDER_SITE_OTHER): Admitting: Orthopaedic Surgery

## 2024-07-18 DIAGNOSIS — M25562 Pain in left knee: Secondary | ICD-10-CM

## 2024-07-18 DIAGNOSIS — M1712 Unilateral primary osteoarthritis, left knee: Secondary | ICD-10-CM

## 2024-07-18 DIAGNOSIS — G8929 Other chronic pain: Secondary | ICD-10-CM

## 2024-07-18 MED ORDER — LIDOCAINE HCL 1 % IJ SOLN
3.0000 mL | INTRAMUSCULAR | Status: AC | PRN
  Administered 2024-07-18: 3 mL

## 2024-07-18 MED ORDER — METHYLPREDNISOLONE ACETATE 40 MG/ML IJ SUSP
40.0000 mg | INTRAMUSCULAR | Status: AC | PRN
  Administered 2024-07-18: 40 mg via INTRA_ARTICULAR

## 2024-07-18 NOTE — Progress Notes (Signed)
 The patient comes in requesting a steroid injection in her left knee today to treat the pain from osteoarthritis.  She is 78 years old and active.  We last injected her knee in early July.  She says that injection did not last long but she did do a lot of yard work and a lot of mowing the grass.  She says she is not a surgical candidate due to some other medical issues and she does see oncology.  On exam her left knee does not have a large effusion today like it has had in the past.  There is really minimal swelling but there is valgus malalignment and significant pain throughout the arc of motion of her knee with known osteoarthritis.  Per her request I did place a steroid injection in her left knee which she tolerated well.  She knows to wait at least 3 months minimum between injections.    Procedure Note  Patient: Anna Fuller             Date of Birth: [birth year]           MRN: [id number]             Visit Date: 07/18/2024  Procedures: Visit Diagnoses:  1. Primary osteoarthritis of left knee   2. Chronic pain of left knee     Large Joint Inj: L knee on 07/18/2024 8:43 AM Indications: diagnostic evaluation and pain Details: 22 G 1.5 in needle, superolateral approach  Arthrogram: No  Medications: 3 mL lidocaine  1 %; 40 mg methylPREDNISolone  acetate 40 MG/ML Outcome: tolerated well, no immediate complications Procedure, treatment alternatives, risks and benefits explained, specific risks discussed. Consent was given by the patient. Immediately prior to procedure a time out was called to verify the correct patient, procedure, equipment, support staff and site/side marked as required. Patient was prepped and draped in the usual sterile fashion.

## 2024-07-30 ENCOUNTER — Encounter: Payer: Self-pay | Admitting: Radiology

## 2024-08-01 DIAGNOSIS — Z23 Encounter for immunization: Secondary | ICD-10-CM | POA: Diagnosis not present

## 2024-08-27 ENCOUNTER — Encounter: Payer: Self-pay | Admitting: Internal Medicine

## 2024-08-30 ENCOUNTER — Other Ambulatory Visit: Payer: Self-pay

## 2024-08-30 DIAGNOSIS — D473 Essential (hemorrhagic) thrombocythemia: Secondary | ICD-10-CM

## 2024-08-30 DIAGNOSIS — D751 Secondary polycythemia: Secondary | ICD-10-CM

## 2024-08-31 ENCOUNTER — Inpatient Hospital Stay: Admitting: Hematology

## 2024-08-31 ENCOUNTER — Inpatient Hospital Stay: Attending: Oncology

## 2024-08-31 VITALS — BP 145/58 | HR 73 | Temp 97.8°F | Resp 17 | Ht 62.0 in | Wt 120.4 lb

## 2024-08-31 DIAGNOSIS — Z885 Allergy status to narcotic agent status: Secondary | ICD-10-CM | POA: Insufficient documentation

## 2024-08-31 DIAGNOSIS — Z7964 Long term (current) use of myelosuppressive agent: Secondary | ICD-10-CM | POA: Diagnosis not present

## 2024-08-31 DIAGNOSIS — I7300 Raynaud's syndrome without gangrene: Secondary | ICD-10-CM | POA: Insufficient documentation

## 2024-08-31 DIAGNOSIS — Z833 Family history of diabetes mellitus: Secondary | ICD-10-CM | POA: Diagnosis not present

## 2024-08-31 DIAGNOSIS — D471 Chronic myeloproliferative disease: Secondary | ICD-10-CM | POA: Diagnosis present

## 2024-08-31 DIAGNOSIS — Z79899 Other long term (current) drug therapy: Secondary | ICD-10-CM | POA: Diagnosis not present

## 2024-08-31 DIAGNOSIS — M1712 Unilateral primary osteoarthritis, left knee: Secondary | ICD-10-CM | POA: Diagnosis not present

## 2024-08-31 DIAGNOSIS — Z803 Family history of malignant neoplasm of breast: Secondary | ICD-10-CM | POA: Diagnosis not present

## 2024-08-31 DIAGNOSIS — E785 Hyperlipidemia, unspecified: Secondary | ICD-10-CM | POA: Insufficient documentation

## 2024-08-31 DIAGNOSIS — Z87891 Personal history of nicotine dependence: Secondary | ICD-10-CM | POA: Diagnosis not present

## 2024-08-31 DIAGNOSIS — Z9049 Acquired absence of other specified parts of digestive tract: Secondary | ICD-10-CM | POA: Diagnosis not present

## 2024-08-31 DIAGNOSIS — G4700 Insomnia, unspecified: Secondary | ICD-10-CM | POA: Diagnosis not present

## 2024-08-31 DIAGNOSIS — D45 Polycythemia vera: Secondary | ICD-10-CM | POA: Diagnosis not present

## 2024-08-31 DIAGNOSIS — Z7982 Long term (current) use of aspirin: Secondary | ICD-10-CM | POA: Diagnosis not present

## 2024-08-31 DIAGNOSIS — M353 Polymyalgia rheumatica: Secondary | ICD-10-CM | POA: Insufficient documentation

## 2024-08-31 DIAGNOSIS — R5383 Other fatigue: Secondary | ICD-10-CM | POA: Diagnosis not present

## 2024-08-31 DIAGNOSIS — D473 Essential (hemorrhagic) thrombocythemia: Secondary | ICD-10-CM | POA: Insufficient documentation

## 2024-08-31 DIAGNOSIS — R0602 Shortness of breath: Secondary | ICD-10-CM | POA: Diagnosis not present

## 2024-08-31 DIAGNOSIS — Z888 Allergy status to other drugs, medicaments and biological substances status: Secondary | ICD-10-CM | POA: Diagnosis not present

## 2024-08-31 DIAGNOSIS — Z90710 Acquired absence of both cervix and uterus: Secondary | ICD-10-CM | POA: Diagnosis not present

## 2024-08-31 DIAGNOSIS — Z83719 Family history of colon polyps, unspecified: Secondary | ICD-10-CM | POA: Insufficient documentation

## 2024-08-31 DIAGNOSIS — Z800 Family history of malignant neoplasm of digestive organs: Secondary | ICD-10-CM | POA: Diagnosis not present

## 2024-08-31 DIAGNOSIS — Z880 Allergy status to penicillin: Secondary | ICD-10-CM | POA: Insufficient documentation

## 2024-08-31 DIAGNOSIS — D751 Secondary polycythemia: Secondary | ICD-10-CM

## 2024-08-31 LAB — CMP (CANCER CENTER ONLY)
ALT: 15 U/L (ref 0–44)
AST: 30 U/L (ref 15–41)
Albumin: 4.8 g/dL (ref 3.5–5.0)
Alkaline Phosphatase: 86 U/L (ref 38–126)
Anion gap: 11 (ref 5–15)
BUN: 16 mg/dL (ref 8–23)
CO2: 25 mmol/L (ref 22–32)
Calcium: 9.7 mg/dL (ref 8.9–10.3)
Chloride: 104 mmol/L (ref 98–111)
Creatinine: 1 mg/dL (ref 0.44–1.00)
GFR, Estimated: 57 mL/min — ABNORMAL LOW (ref >60)
Glucose, Bld: 92 mg/dL (ref 70–99)
Potassium: 4.3 mmol/L (ref 3.5–5.1)
Sodium: 141 mmol/L (ref 135–145)
Total Bilirubin: 0.5 mg/dL (ref 0.0–1.2)
Total Protein: 7.4 g/dL (ref 6.5–8.1)

## 2024-08-31 LAB — CBC WITH DIFFERENTIAL (CANCER CENTER ONLY)
Abs Immature Granulocytes: 0.03 K/uL (ref 0.00–0.07)
Basophils Absolute: 0.1 K/uL (ref 0.0–0.1)
Basophils Relative: 1 %
Eosinophils Absolute: 0.1 K/uL (ref 0.0–0.5)
Eosinophils Relative: 2 %
HCT: 47 % — ABNORMAL HIGH (ref 36.0–46.0)
Hemoglobin: 16.3 g/dL — ABNORMAL HIGH (ref 12.0–15.0)
Immature Granulocytes: 0 %
Lymphocytes Relative: 18 %
Lymphs Abs: 1.4 K/uL (ref 0.7–4.0)
MCH: 39.4 pg — ABNORMAL HIGH (ref 26.0–34.0)
MCHC: 34.7 g/dL (ref 30.0–36.0)
MCV: 113.5 fL — ABNORMAL HIGH (ref 80.0–100.0)
Monocytes Absolute: 0.7 K/uL (ref 0.1–1.0)
Monocytes Relative: 9 %
Neutro Abs: 5.7 K/uL (ref 1.7–7.7)
Neutrophils Relative %: 70 %
Platelet Count: 651 K/uL — ABNORMAL HIGH (ref 150–400)
RBC: 4.14 MIL/uL (ref 3.87–5.11)
RDW: 13.9 % (ref 11.5–15.5)
WBC Count: 8.1 K/uL (ref 4.0–10.5)
nRBC: 0 % (ref 0.0–0.2)

## 2024-08-31 LAB — VITAMIN B12: Vitamin B-12: 2501 pg/mL — ABNORMAL HIGH (ref 180–914)

## 2024-08-31 NOTE — Progress Notes (Signed)
 HEMATOLOGY ONCOLOGY PROGRESS NOTE  Date of service: 08/31/2024  Patient Care Team: Perri Ronal PARAS, MD as PCP - General (Internal Medicine) Patel, Donika K, DO as Consulting Physician (Neurology)  CHIEF COMPLAINT/PURPOSE OF CONSULTATION: Follow-up for continued evaluation and management of Essential thrombocytosis   HISTORY OF PRESENTING ILLNESS: Anna Fuller is a wonderful 78 y.o. female who is here for continued evaluation and management of Essential thrombocytosis. Patient has been transferred to us  by Dr. Amadeo. Patient was diagnosed with JAK-2 positive myeloproliferative disorder in 2007. Essential thrombocythemia was confirmed in 2008 by bone marrow biopsy. Patient was also diagnosed with polymyalgia rheumatica in November 2022.    Patient was last seen by Dr. Amadeo on 09/30/2022 and she complained of left knee pain and mild myalgias. Patient's current treatment includes Hydroxyurea  1500 mg daily which was started in 2009.   SUMMARY OF HEMATOLOGICAL HISTORY: Prior Therapy:   2008: Anagrelide initiated briefly and discontinued due to do intolerance.   08/2021 - 01/2022: High-dose prednisone  due to polymyalgia rheumatica started. Tapered off to 1 mg daily Prednisone  and was discontinued.  Current Therapy (2009 onwards): Hydroxyurea  1500 mg daily.   INTERVAL HISTORY: Anna Fuller is a 78 y.o. female who is here today for continued evaluation and management of Essential thrombocytosis.   she was last seen by me on 07/09/2024; at the time she mentioned experiencing some increased fatigue associated with situational stress and insomnia, as well increased left knee pain due to osteoarthritis.   Today, she says that she's been well. She endorses a compressing pain in the middle finger of her right hand, which has been suggested to be arthritis by another physician - no swelling, pain improves with movement and hot water. She notes that this pain also improved or worsened  concurrently with her PLT levels, but did not notice any associated color changes. She does recall an instance long ago when her hands turned charcoal gray while she was driving, despite being warm, and they returned to normal color after about 20 minutes. Does have a history of Raynaud's Syndrome.  Currently taking Hydroxyurea  1500 mg daily. She endorses increased fatigue with increased dosage of Hydroxyurea .   She also endorses experiencing some shortness of breath lately. This occurred while she was working in her yard, cleaning leaves, so admits that the environment could have contributed to her SOB as it has been improving. She says she may start wearing a mask in the future when she does yard work.   Notes that she recently had a knee injection, which improved the pain, but it is now returning.   She reportedly has a history of Hepatitis in childhood, but isn't sure what type.   Denies any fevers/chills, drenching night sweats, or abdominal pain. She does note intermittent episodes of night sweats, and numbness in her feet, which has been chronic due to prior surgeries, but most recently the numbness has spread to her great toe.  She is insistent about not restarting Anagrelide due to the side effects she experienced.  REVIEW OF SYSTEMS:   10 Point review of systems of done and is negative except as noted above.  MEDICAL HISTORY Past Medical History:  Diagnosis Date   Cancer [facility])    essential thrombocythemia   Hyperlipidemia    Macular degeneration    Neuromuscular disorder (HCC)    chemo induced neuropathy   Polycythemia    on the line   Thrombocytosis     SURGICAL HISTORY Past Surgical History:  Procedure  Laterality Date   ABDOMINAL HYSTERECTOMY     CHOLECYSTECTOMY     COLONOSCOPY     extra bones removed     on feet, knees and wrist   POLYPECTOMY     TONSILLECTOMY     TUMOR EXCISION     left leg    SOCIAL HISTORY Social History   Tobacco Use   Smoking status:  Former    Current packs/day: 0.00    Types: Cigarettes    Quit date: 08/18/2002    Years since quitting: 22.0   Smokeless tobacco: Never  Vaping Use   Vaping status: Never Used  Substance Use Topics   Alcohol use: Yes    Alcohol/week: 0.0 standard drinks of alcohol    Comment: rare   Drug use: No    Social History   Social History Narrative   Right handed    Lives alone    SOCIAL DRIVERS OF HEALTH SDOH Screenings   Food Insecurity: No Food Insecurity (06/10/2024)  Housing: Low Risk  (08/31/2024)  Transportation Needs: No Transportation Needs (06/10/2024)  Utilities: Not At Risk (11/30/2023)  Alcohol Screen: Low Risk  (12/05/2023)  Depression (PHQ2-9): Low Risk  (12/05/2023)  Financial Resource Strain: Low Risk  (06/10/2024)  Physical Activity: Insufficiently Active (06/10/2024)  Social Connections: Moderately Isolated (06/10/2024)  Stress: No Stress Concern Present (06/10/2024)  Tobacco Use: Medium Risk (07/18/2024)  Health Literacy: Adequate Health Literacy (11/30/2023)     FAMILY HISTORY Family History  Problem Relation Age of Onset   Pulmonary fibrosis Mother    Colon polyps Mother    Diabetes Father    Breast cancer Sister    Esophageal cancer Maternal Uncle    Pancreatic cancer Maternal Aunt    Colon cancer Neg Hx    Stomach cancer Neg Hx    Rectal cancer Neg Hx      ALLERGIES: is allergic to codeine, iodine, and penicillins.  MEDICATIONS  Current Outpatient Medications  Medication Sig Dispense Refill   aspirin 81 MG tablet Take 81 mg by mouth daily.     cholecalciferol (VITAMIN D ) 1000 UNITS tablet Take 2,000 Units by mouth daily.     hydroxyurea  (HYDREA ) 500 MG capsule TAKE 3 CAPSULES BY MOUTH EVERY DAY 270 capsule 4   Multiple Vitamins-Minerals (ICAPS AREDS 2 PO) Take by mouth.     ondansetron  (ZOFRAN ) 4 MG tablet Take 1 tablet (4 mg total) by mouth every 8 (eight) hours as needed for nausea or vomiting. 20 tablet 1   rosuvastatin  (CRESTOR ) 5 MG tablet Take  1 tablet (5 mg total) by mouth daily. 90 tablet 3   No current facility-administered medications for this visit.    PHYSICAL EXAMINATION: ECOG PERFORMANCE STATUS: 1 - Symptomatic but completely ambulatory VITALS: Vitals:   08/31/24 1042 08/31/24 1050  BP: (!) 137/55 (!) 145/58  Pulse: 73   Resp: 17   Temp: 97.8 F (36.6 C)   SpO2: 95%    Filed Weights   08/31/24 1042  Weight: 124 lb (56.2 kg)   Body mass index is 22.68 kg/m.  GENERAL: alert, in no acute distress and comfortable SKIN: no acute rashes, no significant lesions EYES: conjunctiva are pink and non-injected, sclera anicteric OROPHARYNX: MMM, no exudates, no oropharyngeal erythema or ulceration NECK: supple, no JVD LYMPH:  no palpable lymphadenopathy in the cervical, axillary or inguinal regions LUNGS: clear to auscultation b/l with normal respiratory effort HEART: regular rate & rhythm ABDOMEN:  normoactive bowel sounds , non tender, not distended, no hepatosplenomegaly  Extremity: no pedal edema PSYCH: alert & oriented x 3 with fluent speech NEURO: no focal motor/sensory deficits  LABORATORY DATA:   I have reviewed the data as listed     Latest Ref Rng & Units 08/31/2024    9:31 AM 07/09/2024    8:10 AM 05/15/2024    8:26 AM  CBC EXTENDED  WBC 4.0 - 10.5 K/uL 8.1  7.9  7.1   RBC 3.87 - 5.11 MIL/uL 4.14  4.02  3.45   Hemoglobin 12.0 - 15.0 g/dL 83.6  83.9  86.1   HCT 36.0 - 46.0 % 47.0  45.7  39.6   Platelets 150 - 400 K/uL 651  614  603   NEUT# 1.7 - 7.7 K/uL 5.7  5.6  4.6   Lymph# 0.7 - 4.0 K/uL 1.4  1.3  1.5    Vitamin B12:  Lab Results  Component Value Date   VITAMINB12 524 07/09/2024        Latest Ref Rng & Units 08/31/2024    9:31 AM 07/09/2024    8:10 AM 06/08/2024    9:08 AM  CMP  Glucose 70 - 99 mg/dL 92  87    BUN 8 - 23 mg/dL 16  21    Creatinine 9.55 - 1.00 mg/dL 8.99  9.06    Sodium 864 - 145 mmol/L 141  140    Potassium 3.5 - 5.1 mmol/L 4.3  4.3    Chloride 98 - 111 mmol/L 104   105    CO2 22 - 32 mmol/L 25  27    Calcium  8.9 - 10.3 mg/dL 9.7  89.8    Total Protein 6.5 - 8.1 g/dL 7.4  7.4  6.6   Total Bilirubin 0.0 - 1.2 mg/dL 0.5  0.6  0.5   Alkaline Phos 38 - 126 U/L 86  66    AST 15 - 41 U/L 30  21  19    ALT 0 - 44 U/L 15  15  16       RADIOGRAPHIC STUDIES: I have personally reviewed the radiological images as listed and agreed with the findings in the report. No results found.  ASSESSMENT & PLAN:  78 y.o. female with  1.  JAK2 positive essential thrombocytosis diagnosed in 2007.     2.  Polymyalgia rheumatica: unclear if this is related to her myeloproliferative disorder.  She is off prednisone  at this time without any recent reactivation.   3.  Thrombosis prophylaxis: Risk of thrombosis remains low at this time.  Her white cell count is normal and has not had any thrombosis episodes.  PLAN: - Discussed lab results on 08/31/2024 in detail with patient: CBC showed WBC of 8.1K, HCT of 47% increased from 45.7%, Hemoglobin of 16.3 increased from 16.0, and PLTs of 651K increased from 614K  PLTs gradually increasing despite compliance with Hydroxyurea  1500 mg daily. May have to increase Hydroxyurea  if this continues, which I discussed with the patient. She is currently asymptomatic, so I am fine with her holding off on this until we reevaluate next visit.   Otherwise, discussed therapeutic phlebotomies for further management, and emphasized that due to her previous side effects with Anagrelide if condition remains uncontrolled with Hydroxyurea  and Phlebotomies, might need to consider Jak2 inhibitors..  CMP stable.  Vitamin B12: 524.   - Shortness of breath possibly due to environment she was working in.  Encouraged use of mask for any work she may encounter environmental irritants.   FOLLOW-UP  -RTC with Dr  Cayce Paschal with labs with appointment for therapeutic phlebotomy in 6 weeks   The total time spent in the appointment was 25 minutes* .  All of the  patient's questions were answered and the patient knows to call the clinic with any problems, questions, or concerns.  Emaline Saran MD MS AAHIVMS [facility] Hematology/Oncology Physician [facility]  *Total Encounter Time as defined by the Centers for Medicare and Medicaid Services includes, in addition to the face-to-face time of a patient visit (documented in the note above) non-face-to-face time: obtaining and reviewing outside history, ordering and reviewing medications, tests or procedures, care coordination (communications with other health care professionals or caregivers) and documentation in the medical record.  I,Emily Lagle,acting as a neurosurgeon for Emaline Saran, MD.,have documented all relevant documentation on the behalf of Emaline Saran, MD,as directed by  Emaline Saran, MD while in the presence of Emaline Saran, MD.  I have reviewed the above documentation for accuracy and completeness, and I agree with the above.  Perseus Westall, MD

## 2024-09-07 ENCOUNTER — Inpatient Hospital Stay: Admitting: Hematology

## 2024-09-07 ENCOUNTER — Inpatient Hospital Stay

## 2024-09-28 ENCOUNTER — Encounter: Payer: Self-pay | Admitting: Internal Medicine

## 2024-10-11 ENCOUNTER — Other Ambulatory Visit: Payer: Self-pay

## 2024-10-11 DIAGNOSIS — D45 Polycythemia vera: Secondary | ICD-10-CM

## 2024-10-12 ENCOUNTER — Inpatient Hospital Stay: Attending: Oncology

## 2024-10-12 ENCOUNTER — Inpatient Hospital Stay (HOSPITAL_BASED_OUTPATIENT_CLINIC_OR_DEPARTMENT_OTHER): Admitting: Hematology

## 2024-10-12 ENCOUNTER — Inpatient Hospital Stay

## 2024-10-12 VITALS — BP 156/61 | HR 79 | Temp 98.2°F | Resp 17 | Wt 117.1 lb

## 2024-10-12 VITALS — BP 126/50 | HR 78 | Resp 16

## 2024-10-12 DIAGNOSIS — D45 Polycythemia vera: Secondary | ICD-10-CM | POA: Diagnosis not present

## 2024-10-12 LAB — CMP (CANCER CENTER ONLY)
ALT: 14 U/L (ref 0–44)
AST: 26 U/L (ref 15–41)
Albumin: 4.4 g/dL (ref 3.5–5.0)
Alkaline Phosphatase: 84 U/L (ref 38–126)
Anion gap: 11 (ref 5–15)
BUN: 16 mg/dL (ref 8–23)
CO2: 26 mmol/L (ref 22–32)
Calcium: 9.2 mg/dL (ref 8.9–10.3)
Chloride: 103 mmol/L (ref 98–111)
Creatinine: 0.94 mg/dL (ref 0.44–1.00)
GFR, Estimated: >60 mL/min
Glucose, Bld: 93 mg/dL (ref 70–99)
Potassium: 4.1 mmol/L (ref 3.5–5.1)
Sodium: 140 mmol/L (ref 135–145)
Total Bilirubin: 0.5 mg/dL (ref 0.0–1.2)
Total Protein: 7.1 g/dL (ref 6.5–8.1)

## 2024-10-12 LAB — CBC WITH DIFFERENTIAL (CANCER CENTER ONLY)
Abs Immature Granulocytes: 0.04 K/uL (ref 0.00–0.07)
Basophils Absolute: 0.1 K/uL (ref 0.0–0.1)
Basophils Relative: 1 %
Eosinophils Absolute: 0.1 K/uL (ref 0.0–0.5)
Eosinophils Relative: 1 %
HCT: 48.6 % — ABNORMAL HIGH (ref 36.0–46.0)
Hemoglobin: 16.5 g/dL — ABNORMAL HIGH (ref 12.0–15.0)
Immature Granulocytes: 1 %
Lymphocytes Relative: 18 %
Lymphs Abs: 1.4 K/uL (ref 0.7–4.0)
MCH: 37.9 pg — ABNORMAL HIGH (ref 26.0–34.0)
MCHC: 34 g/dL (ref 30.0–36.0)
MCV: 111.7 fL — ABNORMAL HIGH (ref 80.0–100.0)
Monocytes Absolute: 1 K/uL (ref 0.1–1.0)
Monocytes Relative: 12 %
Neutro Abs: 5.5 K/uL (ref 1.7–7.7)
Neutrophils Relative %: 67 %
Platelet Count: 551 K/uL — ABNORMAL HIGH (ref 150–400)
RBC: 4.35 MIL/uL (ref 3.87–5.11)
RDW: 14.3 % (ref 11.5–15.5)
WBC Count: 8.1 K/uL (ref 4.0–10.5)
nRBC: 0 % (ref 0.0–0.2)

## 2024-10-12 NOTE — Progress Notes (Signed)
 Anna Fuller presents today for phlebotomy per MD orders. Phlebotomy procedure started at 1043 and ended at 1100. 300 grams removed. 22G PIV to R AC. Patient observed for 30 minutes after procedure without any incident. Patient tolerated procedure well. IV needle removed intact. Beverage and snack provided.

## 2024-10-12 NOTE — Progress Notes (Signed)
 " HEMATOLOGY ONCOLOGY PROGRESS NOTE  Date of service: 10/12/2024  Patient Care Team: Perri Ronal PARAS, MD as PCP - General (Internal Medicine) Tobie Tonita POUR, DO as Consulting Physician (Neurology)  CHIEF COMPLAINT/PURPOSE OF CONSULTATION: Follow-up for continued evaluation and management of essential thrombocytosis.  HISTORY OF PRESENTING ILLNESS: Anna Fuller is a wonderful 79 y.o. female who is here for continued evaluation and management of Essential thrombocytosis. Patient has been transferred to us  by Dr. Amadeo. Patient was diagnosed with JAK-2 positive myeloproliferative disorder in 2007. Essential thrombocythemia was confirmed in 2008 by bone marrow biopsy. Patient was also diagnosed with polymyalgia rheumatica in November 2022.    Patient was last seen by Dr. Amadeo on 09/30/2022 and she complained of left knee pain and mild myalgias. Patient's current treatment includes Hydroxyurea  1500 mg daily which was started in 2009.    SUMMARY OF HEMATOLOGICAL HISTORY: Prior Therapy:   2008: Anagrelide initiated briefly and discontinued due to do intolerance.   08/2021 - 01/2022: High-dose prednisone  due to polymyalgia rheumatica started. Tapered off to 1 mg daily Prednisone  and was discontinued.   Current Therapy (2009 onwards): Hydroxyurea  1500 mg daily.    SUMMARY OF ONCOLOGIC HISTORY: Oncology History   No problem history exists.    INTERVAL HISTORY: Anna Fuller is a 79 y.o. female who is here today for continued evaluation and management of her essential thrombocytosis.   she was last seen by me on 08/31/2024; at the time she mentioned experiencing a compressing pain in the middle finger of her right hand. She notes that the pain is contigent upon her PLT levels. She also reported recent SOB while doing yard work.   Today, she reports lingering cough from a viral infection. Her onset of symptoms was a few weeks ago. She was not seen by her PCP for evaluation. She denies  gasterointestinal symptoms, and tested negative for the flu and COVID via self-administered swab test. She reports some acute weight loss and appetite supression due to the virus.   She reports she has been eating and drinking sufficinetly. She has not had breakfast this morning.  Wt Readings from Last 3 Encounters:  10/12/24 117 lb 1.6 oz (53.1 kg)  08/31/24 120 lb 6.4 oz (54.6 kg)  07/09/24 120 lb 1.6 oz (54.5 kg)    She denies new symptoms, any abdominal pain, SOB, and change in breathing.   REVIEW OF SYSTEMS:   10 Point review of systems of done and is negative except as noted above.  MEDICAL HISTORY Past Medical History:  Diagnosis Date   Cancer [facility])    essential thrombocythemia   Hyperlipidemia    Macular degeneration    Neuromuscular disorder (HCC)    chemo induced neuropathy   Polycythemia    on the line   Thrombocytosis     SURGICAL HISTORY Past Surgical History:  Procedure Laterality Date   ABDOMINAL HYSTERECTOMY     CHOLECYSTECTOMY     COLONOSCOPY     extra bones removed     on feet, knees and wrist   POLYPECTOMY     TONSILLECTOMY     TUMOR EXCISION     left leg    SOCIAL HISTORY Social History[1]  Social History   Social History Narrative   Right handed    Lives alone    SOCIAL DRIVERS OF HEALTH SDOH Screenings   Food Insecurity: No Food Insecurity (06/10/2024)  Housing: Low Risk (08/31/2024)  Transportation Needs: No Transportation Needs (06/10/2024)  Utilities: Not At  Risk (11/30/2023)  Alcohol Screen: Low Risk (12/05/2023)  Depression (PHQ2-9): Low Risk (12/05/2023)  Financial Resource Strain: Low Risk (06/10/2024)  Physical Activity: Insufficiently Active (06/10/2024)  Social Connections: Moderately Isolated (06/10/2024)  Stress: No Stress Concern Present (06/10/2024)  Tobacco Use: Medium Risk (07/18/2024)  Health Literacy: Adequate Health Literacy (11/30/2023)     FAMILY HISTORY Family History  Problem Relation Age of Onset   Pulmonary  fibrosis Mother    Colon polyps Mother    Diabetes Father    Breast cancer Sister    Esophageal cancer Maternal Uncle    Pancreatic cancer Maternal Aunt    Colon cancer Neg Hx    Stomach cancer Neg Hx    Rectal cancer Neg Hx      ALLERGIES: is allergic to codeine, iodine, and penicillins.  MEDICATIONS  Current Outpatient Medications  Medication Sig Dispense Refill   aspirin 81 MG tablet Take 81 mg by mouth daily.     cholecalciferol (VITAMIN D ) 1000 UNITS tablet Take 2,000 Units by mouth daily.     hydroxyurea  (HYDREA ) 500 MG capsule TAKE 3 CAPSULES BY MOUTH EVERY DAY 270 capsule 4   Multiple Vitamins-Minerals (ICAPS AREDS 2 PO) Take by mouth.     ondansetron  (ZOFRAN ) 4 MG tablet Take 1 tablet (4 mg total) by mouth every 8 (eight) hours as needed for nausea or vomiting. 20 tablet 1   rosuvastatin  (CRESTOR ) 5 MG tablet Take 1 tablet (5 mg total) by mouth daily. 90 tablet 3   No current facility-administered medications for this visit.    PHYSICAL EXAMINATION: ECOG PERFORMANCE STATUS: 1 - Symptomatic but completely ambulatory VITALS: Vitals:   10/12/24 0946  BP: (!) 156/61  Pulse: 79  Resp: 17  Temp: 98.2 F (36.8 C)  SpO2: 93%   Filed Weights   10/12/24 0946  Weight: 117 lb 1.6 oz (53.1 kg)   Body mass index is 21.42 kg/m.  GENERAL: alert, in no acute distress and comfortable SKIN: no acute rashes, no significant lesions EYES: conjunctiva are pink and non-injected, sclera anicteric OROPHARYNX: MMM, no exudates, no oropharyngeal erythema or ulceration NECK: supple, no JVD LYMPH:  no palpable lymphadenopathy in the cervical, axillary or inguinal regions LUNGS: clear to auscultation b/l with normal respiratory effort HEART: regular rate & rhythm ABDOMEN:  normoactive bowel sounds , non tender, not distended, no hepatosplenomegaly Extremity: no pedal edema PSYCH: alert & oriented x 3 with fluent speech NEURO: no focal motor/sensory deficits  LABORATORY DATA:    I have reviewed the data as listed     Latest Ref Rng & Units 10/12/2024    8:50 AM 08/31/2024    9:31 AM 07/09/2024    8:10 AM  CBC EXTENDED  WBC 4.0 - 10.5 K/uL 8.1  8.1  7.9   RBC 3.87 - 5.11 MIL/uL 4.35  4.14  4.02   Hemoglobin 12.0 - 15.0 g/dL 83.4  83.6  83.9   HCT 36.0 - 46.0 % 48.6  47.0  45.7   Platelets 150 - 400 K/uL 551  651  614   NEUT# 1.7 - 7.7 K/uL 5.5  5.7  5.6   Lymph# 0.7 - 4.0 K/uL 1.4  1.4  1.3        Latest Ref Rng & Units 10/12/2024    8:50 AM 08/31/2024    9:31 AM 07/09/2024    8:10 AM  CMP  Glucose 70 - 99 mg/dL 93  92  87   BUN 8 - 23 mg/dL 16  16  21   Creatinine 0.44 - 1.00 mg/dL 9.05  8.99  9.06   Sodium 135 - 145 mmol/L 140  141  140   Potassium 3.5 - 5.1 mmol/L 4.1  4.3  4.3   Chloride 98 - 111 mmol/L 103  104  105   CO2 22 - 32 mmol/L 26  25  27    Calcium  8.9 - 10.3 mg/dL 9.2  9.7  89.8   Total Protein 6.5 - 8.1 g/dL 7.1  7.4  7.4   Total Bilirubin 0.0 - 1.2 mg/dL 0.5  0.5  0.6   Alkaline Phos 38 - 126 U/L 84  86  66   AST 15 - 41 U/L 26  30  21    ALT 0 - 44 U/L 14  15  15       RADIOGRAPHIC STUDIES: I have personally reviewed the radiological images as listed and agreed with the findings in the report. No results found.  ASSESSMENT & PLAN:  79 y.o. female with  1.  JAK2 positive essential thrombocytosis diagnosed in 2007.     2.  Polymyalgia rheumatica: unclear if this is related to her myeloproliferative disorder.  She is off prednisone  at this time without any recent reactivation.   3.  Thrombosis prophylaxis: Risk of thrombosis remains low at this time.  Her white cell count is normal and has not had any thrombosis episodes.  PLAN: - Discussed lab results on 10/12/2024 in detail with patient: -PLTs improved slightly down to 551k from 651k -suggested prompt PCP evaluation next time she has a viral infection -counseled to wear mask to protect her immune system in public -phlebotomy today, 300 ccs, contingent on eating and  drinking beforehand -RTC in 2 months to discuss results from phlebotomy  FOLLOW-UP in 2 months for labs and follow-up with Dr. Onesimo.  The total time spent in the appointment was 21 minutes* .  All of the patient's questions were answered and the patient knows to call the clinic with any problems, questions, or concerns.  Emaline Onesimo MD MS AAHIVMS [facility] Hematology/Oncology Physician [facility]  *Total Encounter Time as defined by the Centers for Medicare and Medicaid Services includes, in addition to the face-to-face time of a patient visit (documented in the note above) non-face-to-face time: obtaining and reviewing outside history, ordering and reviewing medications, tests or procedures, care coordination (communications with other health care professionals or caregivers) and documentation in the medical record.  I, Alan Blowers, acting as a neurosurgeon for Emaline Onesimo, MD.,have documented all relevant documentation on the behalf of Emaline Onesimo, MD,as directed by  Emaline Onesimo, MD while in the presence of Emaline Onesimo, MD.  I have reviewed the above documentation for accuracy and completeness, and I agree with the above.  Emaline Onesimo, MD     [1]  Social History Tobacco Use   Smoking status: Former    Current packs/day: 0.00    Types: Cigarettes    Quit date: 08/18/2002    Years since quitting: 22.1   Smokeless tobacco: Never  Vaping Use   Vaping status: Never Used  Substance Use Topics   Alcohol use: Yes    Alcohol/week: 0.0 standard drinks of alcohol    Comment: rare   Drug use: No   "

## 2024-10-18 ENCOUNTER — Encounter: Payer: Self-pay | Admitting: Hematology

## 2024-11-21 ENCOUNTER — Ambulatory Visit: Admitting: Orthopaedic Surgery

## 2024-12-03 ENCOUNTER — Inpatient Hospital Stay

## 2024-12-03 ENCOUNTER — Inpatient Hospital Stay: Attending: Oncology | Admitting: Hematology

## 2024-12-06 ENCOUNTER — Other Ambulatory Visit: Payer: Self-pay

## 2024-12-07 ENCOUNTER — Ambulatory Visit: Admitting: Internal Medicine

## 2024-12-10 ENCOUNTER — Ambulatory Visit: Admitting: Internal Medicine

## 2024-12-10 ENCOUNTER — Ambulatory Visit: Payer: Self-pay | Admitting: Internal Medicine
